# Patient Record
Sex: Female | Born: 1950 | Race: White | Hispanic: No | Smoking: Never smoker
Health system: Southern US, Community
[De-identification: ages and names within clinical notes are randomized; demographics above are authoritative.]

## PROBLEM LIST (undated history)

## (undated) DIAGNOSIS — R599 Enlarged lymph nodes, unspecified: Secondary | ICD-10-CM

## (undated) DIAGNOSIS — R519 Headache, unspecified: Secondary | ICD-10-CM

## (undated) DIAGNOSIS — R51 Headache: Secondary | ICD-10-CM

## (undated) DIAGNOSIS — B029 Zoster without complications: Secondary | ICD-10-CM

## (undated) DIAGNOSIS — M549 Dorsalgia, unspecified: Secondary | ICD-10-CM

## (undated) DIAGNOSIS — Z9221 Personal history of antineoplastic chemotherapy: Secondary | ICD-10-CM

## (undated) HISTORY — PX: WRIST GANGLION EXCISION: SUR520

## (undated) HISTORY — PX: HEMORRHOID SURGERY: SHX153

## (undated) HISTORY — PX: BREAST SURGERY: SHX581

## (undated) HISTORY — PX: TUBAL LIGATION: SHX77

---

## 1978-09-23 HISTORY — PX: ABDOMINAL HYSTERECTOMY: SHX81

## 2001-10-12 ENCOUNTER — Encounter: Payer: Self-pay | Admitting: Internal Medicine

## 2001-10-12 ENCOUNTER — Ambulatory Visit (HOSPITAL_COMMUNITY): Admission: RE | Admit: 2001-10-12 | Discharge: 2001-10-12 | Payer: Self-pay | Admitting: Internal Medicine

## 2002-11-17 ENCOUNTER — Ambulatory Visit (HOSPITAL_COMMUNITY): Admission: RE | Admit: 2002-11-17 | Discharge: 2002-11-17 | Payer: Self-pay | Admitting: Internal Medicine

## 2002-11-17 ENCOUNTER — Encounter: Payer: Self-pay | Admitting: Internal Medicine

## 2002-11-22 ENCOUNTER — Encounter: Payer: Self-pay | Admitting: Internal Medicine

## 2002-11-22 ENCOUNTER — Ambulatory Visit (HOSPITAL_COMMUNITY): Admission: RE | Admit: 2002-11-22 | Discharge: 2002-11-22 | Payer: Self-pay | Admitting: Internal Medicine

## 2003-11-07 ENCOUNTER — Ambulatory Visit (HOSPITAL_COMMUNITY): Admission: RE | Admit: 2003-11-07 | Discharge: 2003-11-07 | Payer: Self-pay | Admitting: Internal Medicine

## 2003-11-28 ENCOUNTER — Ambulatory Visit (HOSPITAL_COMMUNITY): Admission: RE | Admit: 2003-11-28 | Discharge: 2003-11-28 | Payer: Self-pay | Admitting: Internal Medicine

## 2004-03-19 ENCOUNTER — Ambulatory Visit (HOSPITAL_COMMUNITY): Admission: RE | Admit: 2004-03-19 | Discharge: 2004-03-19 | Payer: Self-pay | Admitting: Internal Medicine

## 2004-09-07 ENCOUNTER — Ambulatory Visit: Admission: RE | Admit: 2004-09-07 | Discharge: 2004-09-07 | Payer: Self-pay | Admitting: Internal Medicine

## 2005-02-14 ENCOUNTER — Ambulatory Visit (HOSPITAL_COMMUNITY): Admission: RE | Admit: 2005-02-14 | Discharge: 2005-02-14 | Payer: Self-pay | Admitting: Internal Medicine

## 2006-05-13 ENCOUNTER — Ambulatory Visit: Payer: Self-pay | Admitting: Internal Medicine

## 2006-10-17 ENCOUNTER — Ambulatory Visit: Payer: Self-pay | Admitting: Internal Medicine

## 2006-10-17 ENCOUNTER — Ambulatory Visit (HOSPITAL_COMMUNITY): Admission: RE | Admit: 2006-10-17 | Discharge: 2006-10-17 | Payer: Self-pay | Admitting: Internal Medicine

## 2006-10-24 ENCOUNTER — Encounter (INDEPENDENT_AMBULATORY_CARE_PROVIDER_SITE_OTHER): Payer: Self-pay | Admitting: Specialist

## 2006-10-24 ENCOUNTER — Ambulatory Visit: Payer: Self-pay | Admitting: Internal Medicine

## 2006-10-24 ENCOUNTER — Ambulatory Visit (HOSPITAL_COMMUNITY): Admission: RE | Admit: 2006-10-24 | Discharge: 2006-10-24 | Payer: Self-pay | Admitting: Internal Medicine

## 2007-10-19 ENCOUNTER — Ambulatory Visit (HOSPITAL_COMMUNITY): Admission: RE | Admit: 2007-10-19 | Discharge: 2007-10-19 | Payer: Self-pay | Admitting: Internal Medicine

## 2007-10-29 ENCOUNTER — Encounter: Admission: RE | Admit: 2007-10-29 | Discharge: 2007-10-29 | Payer: Self-pay | Admitting: Internal Medicine

## 2008-04-25 ENCOUNTER — Encounter: Admission: RE | Admit: 2008-04-25 | Discharge: 2008-04-25 | Payer: Self-pay | Admitting: Internal Medicine

## 2008-11-14 ENCOUNTER — Encounter: Admission: RE | Admit: 2008-11-14 | Discharge: 2008-11-14 | Payer: Self-pay | Admitting: Internal Medicine

## 2009-06-02 ENCOUNTER — Encounter: Admission: RE | Admit: 2009-06-02 | Discharge: 2009-06-02 | Payer: Self-pay | Admitting: Internal Medicine

## 2009-12-25 ENCOUNTER — Encounter (INDEPENDENT_AMBULATORY_CARE_PROVIDER_SITE_OTHER): Payer: Self-pay

## 2010-10-14 ENCOUNTER — Encounter: Payer: Self-pay | Admitting: Internal Medicine

## 2010-10-15 ENCOUNTER — Encounter: Payer: Self-pay | Admitting: Internal Medicine

## 2010-10-23 NOTE — Letter (Signed)
Summary: Recall, Screening Colonoscopy Only  [facility]  [address]   [place], [place] [address]   Phone: [phone]  Fax: [phone]    December 25, 2009  MARISHA RENIER [address] [place], [place]  [address] April 12, 1951   Dear Ms. Cecchi,   Our records indicate it is time to schedule your colonoscopy. Please call our office to schedule an office visit to get this scheduled.  You can reach [place] at [phone].     Thank you,    Burnadette Peter, LPN Waldon Merl, [facility] Ph: [phone]   Fax: [phone]

## 2010-10-23 NOTE — Miscellaneous (Signed)
Summary: Colonoscopy/10/24/2006  Clinical Lists Changes   NAME:  Katelyn Reeves, Katelyn Reeves                 ACCOUNT NO.:  [id number]   MEDICAL RECORD NO.:  [id number]          PATIENT TYPE:  AMB   LOCATION:  DAY                           FACILITY:  [facility]   PHYSICIAN:  R. Garfield Cornea, M.D. DATE OF BIRTH:  [birth year]   DATE OF PROCEDURE:  10/24/2006  DATE OF DISCHARGE:                               OPERATIVE REPORT   PROCEDURE:  Colonoscopy with biopsy.   INDICATIONS FOR PROCEDURE:  Patient is a 60 year old lady overdue for a  screening colonoscopy.  I attempted to get to her cecum on colonoscopy  in 2005 but was unable to do so because of redundancy and recurrent  looping.  She failed to follow up, so recommended air contrast barium  enema.  She has a history of hemoccult-positive stool.  She has some  intermittent, sporadic abdominal pain.  There has been a history of  intermittent rectal bleeding as well.  No family history of GI  malignancy.  She is overdue for routine GYN evaluation.  Colonoscopy is  now being done.  This approach has been discussed with the patient at  length.  Potential risks, benefits and alternatives have been reviewed  and questions answered.  She is agreeable.  Please see documentation on  the medical record.   PROCEDURE NOTE:  O2 saturation, blood pressure, pulses, and respirations  were monitored throughout the entire procedure.  Conscious sedation with  Versed 6 mg IV and Demerol 150 mg IV in divided doses.   INSTRUMENT:  Olympus video chip system, adult scope.   FINDINGS:  Digital rectal exam revealed tiny external hemorrhoid tag,  otherwise negative.   ENDOSCOPIC FINDINGS:  Prep was adequate.   COLON:  The colonic mucosa was surveyed from the rectosigmoid junction  to the left transverse, right colon, the appendiceal orifice, the  ileocecal valve, and cecum.  These structures were well seen and  photographed for the record.  The patient's colon was, again,  redundant  and elongated, and it was a challenge to get to the cecum.  A number of  maneuvers had to be employed, including external abdominal pressure and  changing of the patient's position; however, the cecum was reached.  From this level, the scope was slowly and cautiously withdrawn.  All  previously mentioned mucosal surfaces were again seen.  There was a  polyp in the base of the cecum (3 cm), cold-biopsied/removed.  The  remainder of the colonic mucosa appeared normal.  The scope was pulled  down into the rectum where a thorough examination of the rectal mucosa  and retroflexed view of the anal verge revealed internal hemorrhoids,  anal papillae only.  The patient tolerated the procedure well and was  reactive to endoscopy.   ENDOSCOPY IMPRESSION:  External and internal hemorrhoids and papillae,  otherwise normal rectum.  A long, tortuous, redundant colon, a  diminutive polyp in the cecum removed, otherwise the colonic mucosa  appeared normal.   RECOMMENDATIONS:  1. Hemorrhoid literature provided to Ms. Gruver.  2. Follow up on path.  3.  A 10-day course of Anusol-HC suppositories 1 per rectum at bedtime.  4. This nice lady was urged to follow through on GYN evaluation due to      her family history of ovarian cancer.  She is overdue.  She is also      to follow up with Dr. Willey Blade at some point in the near future.  5. Plan to see this nice lady back in three months to see how she is      doing from a GI standpoint.      Bridgette Habermann, M.D.  Electronically Signed     RMR/MEDQ  D:  10/24/2006  T:  10/24/2006  Job:  [id number]   cc:   Paula Compton. Willey Blade, MD  Fax: [phone]

## 2010-10-23 NOTE — Miscellaneous (Signed)
Summary: Path/ 10/24/2006  Clinical Lists Changes   SP Surgical Pathology - STATUS: Final             By: Lunette Stands,       Perform Date: 1 Feb08 00:01  Ordered By: Gala Romney MD , Cristopher Estimable           Ordered Date: 1 Feb08 14:02  Facility: [facility]                               Department: [facility]   [address] [place], [place] [address]   [phone]    REPORT OF SURGICAL PATHOLOGY    Case #: [id number]   Patient Name: Katelyn Reeves, Katelyn Reeves.   Office Chart Number: N/A    MRN: [id number]   Pathologist: Chrystie Nose. Saralyn Pilar, MD   DOB/Age 10-13-1950 (Age: 60) Gender: F   Date Taken: 10/24/2006   Date Received: 10/24/2006    FINAL DIAGNOSIS    ***MICROSCOPIC EXAMINATION AND DIAGNOSIS***    COLON, CECAL POLYP: TUBULAR ADENOMA. NO HIGH GRADE DYSPLASIA OR   MALIGNANCY IDENTIFIED.    mj   Date Reported: 10/27/2006 Chrystie Nose. Saralyn Pilar, MD   *** Electronically Signed Out By JDP ***    Clinical information   Polyp (km)    specimen(s) obtained   Colon, polyp(s)    Gross Description   Received in formalin is a tan, soft tissue fragment that is   submitted in toto. Size: 0.2 cm (SW:mj 10/24/06)    mj/

## 2010-10-24 ENCOUNTER — Other Ambulatory Visit: Payer: Self-pay | Admitting: Internal Medicine

## 2010-10-24 DIAGNOSIS — Z1239 Encounter for other screening for malignant neoplasm of breast: Secondary | ICD-10-CM

## 2010-10-29 ENCOUNTER — Ambulatory Visit: Payer: Self-pay

## 2010-11-09 ENCOUNTER — Ambulatory Visit
Admission: RE | Admit: 2010-11-09 | Discharge: 2010-11-09 | Disposition: A | Discharge status: Home / Self Care | Payer: BC Managed Care – PPO | Source: Ambulatory Visit | Attending: Internal Medicine | Admitting: Internal Medicine

## 2010-11-09 DIAGNOSIS — Z1239 Encounter for other screening for malignant neoplasm of breast: Secondary | ICD-10-CM

## 2011-02-08 NOTE — Op Note (Signed)
NAME:  Katelyn Reeves, Katelyn Reeves                 ACCOUNT NO.:  [id number]   MEDICAL RECORD NO.:  [id number]          PATIENT TYPE:  AMB   LOCATION:  DAY                           FACILITY:  [facility]   PHYSICIAN:  R. Garfield Cornea, M.D. DATE OF BIRTH:  07/22/1951   DATE OF PROCEDURE:  10/24/2006  DATE OF DISCHARGE:                               OPERATIVE REPORT   PROCEDURE:  Colonoscopy with biopsy.   INDICATIONS FOR PROCEDURE:  Patient is a 60 year old lady overdue for a  screening colonoscopy.  I attempted to get to her cecum on colonoscopy  in 2005 but was unable to do so because of redundancy and recurrent  looping.  She failed to follow up, so recommended air contrast barium  enema.  She has a history of hemoccult-positive stool.  She has some  intermittent, sporadic abdominal pain.  There has been a history of  intermittent rectal bleeding as well.  No family history of GI  malignancy.  She is overdue for routine GYN evaluation.  Colonoscopy is  now being done.  This approach has been discussed with the patient at  length.  Potential risks, benefits and alternatives have been reviewed  and questions answered.  She is agreeable.  Please see documentation on  the medical record.   PROCEDURE NOTE:  O2 saturation, blood pressure, pulses, and respirations  were monitored throughout the entire procedure.  Conscious sedation with  Versed 6 mg IV and Demerol 150 mg IV in divided doses.   INSTRUMENT:  Olympus video chip system, adult scope.   FINDINGS:  Digital rectal exam revealed tiny external hemorrhoid tag,  otherwise negative.   ENDOSCOPIC FINDINGS:  Prep was adequate.   COLON:  The colonic mucosa was surveyed from the rectosigmoid junction  to the left transverse, right colon, the appendiceal orifice, the  ileocecal valve, and cecum.  These structures were well seen and  photographed for the record.  The patient's colon was, again, redundant  and elongated, and it was a challenge to get to  the cecum.  A number of  maneuvers had to be employed, including external abdominal pressure and  changing of the patient's position; however, the cecum was reached.  From this level, the scope was slowly and cautiously withdrawn.  All  previously mentioned mucosal surfaces were again seen.  There was a  polyp in the base of the cecum (3 cm), cold-biopsied/removed.  The  remainder of the colonic mucosa appeared normal.  The scope was pulled  down into the rectum where a thorough examination of the rectal mucosa  and retroflexed view of the anal verge revealed internal hemorrhoids,  anal papillae only.  The patient tolerated the procedure well and was  reactive to endoscopy.   ENDOSCOPY IMPRESSION:  External and internal hemorrhoids and papillae,  otherwise normal rectum.  A long, tortuous, redundant colon, a  diminutive polyp in the cecum removed, otherwise the colonic mucosa  appeared normal.   RECOMMENDATIONS:  1. Hemorrhoid literature provided to Katelyn Reeves.  2. Follow up on path.  3. A 10-day course of Anusol-HC suppositories 1 per  rectum at bedtime.  4. This nice lady was urged to follow through on GYN evaluation due to      her family history of ovarian cancer.  She is overdue.  She is also      to follow up with Katelyn Reeves at some point in the near future.  5. Plan to see this nice lady back in three months to see how she is      doing from a GI standpoint.      Bridgette Habermann, M.D.  Electronically Signed     RMR/MEDQ  D:  10/24/2006  T:  10/24/2006  Job:  [id number]   cc:   Paula Compton. Willey Blade, MD  Fax: [phone]

## 2011-02-08 NOTE — H&P (Signed)
NAME:  [name], [name]                 ACCOUNT NO.:  [id number]   MEDICAL RECORD NO.:  [id number]          PATIENT TYPE:  AMB   LOCATION:                                FACILITY:  [facility]   PHYSICIAN:  Hildred Laser, M.D.    DATE OF BIRTH:  10/06/1950   DATE OF ADMISSION:  DATE OF DISCHARGE:  LH                              HISTORY & PHYSICAL   CHIEF COMPLAINT:  Abdominal pain, schedule colonoscopy.   HISTORY OF PRESENT ILLNESS:  [name] is a pleasant 60 year old lady  who was seen back in August of 2007 by Dr. Gala Romney for an episode of  abdominal pain and rectal bleeding.  Plan at that time was to obtain some lab work and possibly a CT scan  based on labs. It was advised that she eventually undergo a colonoscopy  given heme positive stools. She had been out of town on business for the  majority of the last 4 months. She called and wanted to schedule the  appointment because of ongoing abdominal pain and she was asked to be  seen today. She states that she is having sporadic episodes of abdominal  pain. She did pretty well in the summer time but developed another  episode in November and again 2 weeks ago. She describes having pain  which wakes her up at  4 a.m. in the morning. It is a crampy type pain. On one occasion she did  have multiple stools, but the other time no bowel movements. After these  episodes she was left with some right mid abdominal/flank tenderness.  She denies any dysuria, hematuria. No nausea, vomiting, fever or chills.  No melena or recent rectal bleeding. The pain lasts for an hour at a  time. She describes her right mid abdominal pain/right flank pain as  being superficial. She has gained 10 pounds since we last saw her. In  between these episodes she denies any postprandial abdominal pain and  actually has been testing herself by eating all kinds of fatty fried  foods without any result.   CURRENT MEDICATIONS:  None currently.   ALLERGIES:  SULFA.   PAST  MEDICAL HISTORY:  Hypertension.   PAST SURGICAL HISTORY:  Diagnostic laparoscopy for endometriosis; tubal  ligation; total abdominal hysterectomy with bilateral salpingo-  oophorectomy. She previously reported that she has had an appendectomy  but now states that she is not really sure that her appendix has been  removed.   FAMILY HISTORY:  Mother succumbed to ovarian cancer at age 53. Father  died in his 36's in an MVA. No history of chronic GI or liver illness.  No colorectal cancer in the family.   SOCIAL HISTORY:  She is divorced. She has one child. 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. No tobacco or alcohol use.   REVIEW OF SYSTEMS:  See HPI for GI. Denies any significant weight loss  and actually has gained 10 pounds since we last saw her.  CARDIOPULMONARY: No chest pain or shortness of  breath.   PHYSICAL EXAMINATION:  VITAL SIGNS: Weight 216.5, height 5 feet, 3  inches. Temperature 98.2, blood pressure 144/86, pulse 80.  GENERAL: A pleasant well-developed, well-nourished Caucasian female in  no acute distress.  SKIN: Warm and dry, no jaundice.  HEENT: Sclerae non icteric. Oropharyngeal mucosa moist and pink, no  lesions, erythema or exudates.  NECK: No lymphadenopathy.  CHEST:  Lungs are clear to auscultation.  CARDIAC EXAM: Reveals a regular rate and rhythm, normal S1, S2, no  murmurs, rubs, or gallops.  ABDOMEN: Positive bowel sounds, obese but symmetrical. Nontender. No  organomegaly or masses.  EXTREMITIES: No edema.   IMPRESSION:  Ms Katelyn Reeves is a 60 year old lady who is overdue for a  colonoscopy. Not mentioned before, she had an attempted colonoscopy in  2005 but her right colon could not be imaged. Unfortunately she did not  follow up with a barium enema as recommended. At her last office visit  she did have heme positive stools. It has been recommended that she  undergo colonoscopy for  that reason. Her abdominal pain is quite  sporadic. Etiology unknown at this time.   PLAN:  1. Colonoscopy in the near future. This may need to be done with a      pediatric scope because of a history of incomplete exam. If      colonoscopy is unremarkable she will need to have further work-up      of her abdominal pain if it continues.  2. Encouraged her to follow-up with a gynecologist as it has been      years since she has been seen.    Dictated by Lujean Amel. Marrianne Mood.      Neil Crouch, P.A.      Hildred Laser, M.D.  Electronically Signed    LL/MEDQ  D:  10/17/2006  T:  10/17/2006  Job:  [id number]

## 2011-02-08 NOTE — Op Note (Signed)
NAME:  Katelyn Reeves, Katelyn Reeves                           ACCOUNT NO.:  [id number]   MEDICAL RECORD NO.:  [id number]                   PATIENT TYPE:  AMB   LOCATION:  DAY                                  FACILITY:  [facility]   PHYSICIAN:  R. Garfield Cornea, M.D.              DATE OF BIRTH:  1951/01/10   DATE OF PROCEDURE:  03/19/2004  DATE OF DISCHARGE:                                 OPERATIVE REPORT   PROCEDURE:  Colonoscopy screening, incomplete.   ENDOSCOPIST:  Cristopher Estimable. Rourk, M.D.   INDICATIONS FOR PROCEDURE:  The patient is a 60 year old lady who comes for  colorectal cancer screening.  She has never had a complete colonoscopy.  She  has no bowel symptoms.  There is no family history of colorectal neoplasia.  Colonoscopy is now being done as a screening maneuver.  This approach has  been discussed with the patient at length.  The potential risks, benefits,  and alternatives have been reviewed; questions answered.   PROCEDURE NOTE:  O2 saturation, blood pressure, pulse and respirations were  monitored throughout the entirety of the procedure.  Conscious sedation:  Versed 5 mg IV, Demerol 125 mg IV in divided doses.   INSTRUMENT:  Olympus video chip system.   FINDINGS:  Digital rectal exam revealed no abnormalities.   ENDOSCOPIC FINDINGS:  The prep was adequate.   RECTUM:  Examination of the rectal mucosa revealed no abnormalities. The  rectal vault was small and I was unable to retroflex, but the rectal mucosa  was seen well.   COLON:  The colonic mucosa was surveyed from the rectosigmoid junction well  into, what I feel was the midcolon.  The colon was quite elongated and  tortuous and recurrent looping was a challenge in spite of changing the  patient's position and exhausting all external pressure maneuvers.  I was  unable to advance the scope into the right colon.  In addition, the patient  was poorly tolerant of the procedure and had significant discomfort during  the procedure.   The colonic mucosa that was seen appeared normal.  The  patient was reacted in endoscopy.   IMPRESSION:  1. Normal rectum.  2. Tortuous colon, recurrent looping precluded cecal intubation or imaging     of the right colon, for that matter.   RECOMMENDATIONS:  1. We will compliment today's study with an air-contrast barium enema and     enema to image her right colon.  2. Further recommendations to follow.      ___________________________________________                                            Bridgette Habermann, M.D.   RMR/MEDQ  D:  03/19/2004  T:  03/19/2004  Job:  [id number]   cc:  Paula Compton. Willey Blade, M.D.  [address]  [place] [address]  Fax: [phone]   R. Garfield Cornea, M.D.  [address]  [place]  [place] [address]  Fax: [phone]

## 2011-02-08 NOTE — H&P (Signed)
NAME:  Katelyn Reeves, Katelyn Reeves                 ACCOUNT NO.:  [id number]   MEDICAL RECORD NO.:  [id number]          PATIENT TYPE:  AMB   LOCATION:                                FACILITY:  [facility]   PHYSICIAN:  R. Garfield Cornea, M.D. DATE OF BIRTH:  1951/08/16   DATE OF ADMISSION:  05/13/2006  DATE OF DISCHARGE:  LH                                HISTORY & PHYSICAL   CHIEF COMPLAINT:  Episode of abdominal pain and rectal bleeding.   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  She has been moving her bowels pretty much daily, but has had a  scant amount of blood per rectum recently, certainly not an every-day  occurrence.  She has not had any abdominal pain whatsoever today.  She  really has not had any typical reflux symptoms, no early satiety.  She says  she has lost a couple of pounds, but she has been out in the heat, moving  into a new home in [place] and wonders if this is not the reason for the weight  loss.  The gallbladder remains in situ.  She is status post appendectomy.  She is status post total hysterectomy (mother succumb to ovarian cancer at  age 36+).  She takes hydrochlorothiazide 12.5 mg daily for hypertension  under the direction of Dr. Willey Blade, but she states she has not seen Dr. Willey Blade  in some time.   She is woefully overdue for a gynecology checkup, since it has been several  years.  She has not really had a fever or chills.  She has not seen any  other Weyman Bogdon for this rather nonspecific abdominal pain, which she  describes as generalized crampy discomfort.   This lady was last seen by me on March 19, 2004, at which time we attempted a  screening colonoscopy.  She had a markedly tortuous, redundant colon and in  spite  of my best effort, I was unable to image the right colon.  I scheduled  an air contrast barium enema and explained the importance of this to Ms.  Katelyn Reeves, but per Radiology, this nice lady cancelled her appointment for the  procedure and did not reschedule.   SOCIAL HISTORY:  She does not smoke, use alcohol or nonsteroidal agents.   PAST MEDICAL HISTORY:  Significant for hypertension.   PAST SURGICAL HISTORY:  1. Total hysterectomy.  2. Ganglion cyst removed from left wrist.  3. Appendectomy.   CURRENT MEDICATIONS:  Hydrochlorothiazide 12.5 mg daily.   ALLERGIES:  SULFA.   FAMILY HISTORY:  Mother succumb to ovarian cancer at age 45.  Father died in  his 24s in a motor vehicle accident.  No history of chronic GI or liver  illness.   SOCIAL HISTORY:  The patient is divorced.  She has 1 child.  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  She works for [employer].  No tobacco.  No alcohol.   REVIEW OF SYSTEMS:  No chest pain.  No dyspnea on exertion.  Minimal weight  loss recently.  No odynophagia, dysphagia or early satiety reflux symptoms.  No nausea or vomiting.  Scant hematochezia.  No melena.  She denies  constipation or diarrhea.   PHYSICAL EXAMINATION:  GENERAL:  This is a pleasant 60 year old lady,  resting comfortably.  VITAL SIGNS:  Weight 205.  Height 5 feet 3 inches.  Temperature 98.8.  BP  124/80.  Pulse 64.  SKIN:  Warm and dry.  There is no jaundice and no cutaneous stigmata of  chronic liver disease.  HEENT:  No scleral icterus.  Conjunctivae are pink.  Oral cavity with no  lesions.  NECK:  JVD is not prominent.  CHEST:  Lungs are clear to auscultation.  HEART:  Regular rate and rhythm without murmur, gallop or rub.  BREASTS:  Deferred.  ABDOMEN:  Significantly obese.  Positive bowel sounds.  Entirely nontender  to deep palpation.  No appreciable mass or organomegaly.  EXTREMITIES:  No edema.  RECTAL:  Good  sphincter tone.  One external hemorrhoid tag.  She does have  some hard stool, somewhat difficult to move, up in her proximal rectum,  really reaching, no obvious mass.  Stool recovered is brown in color, but  strongly Hemoccult-positive.   IMPRESSION:  Katelyn Reeves is a pleasant 60 year old lady who has really  had some very vague apparent self-limiting abdominal pain recently in the  setting of some scant blood per rectum.  She is Hemoccult-positive today.  I  am concerned that her right colon apparently has never been imaged, as this  lady failed to follow through with recommendations for further evaluation  back in 2005.  At this point, I think the best approach would be to go ahead  and offer her another colonoscopy in the hopes that we can make it to the  cecum to thoroughly evaluate her colon as to the etiology of her Hemoccult-  positive stool.  Her abdominal pain is quite nonspecific and apparently self-  limiting.  Depending on CT findings, we might contemplate a CT of the  abdomen and pelvis for further evaluation.  We will get a Chem-20 and CBC  today.  I have urged Katelyn Reeves to seek out gynecological evaluation, as she  is overdue.  Also, it would be a good idea for her to touch base with Dr.  Willey Blade at some time between now and the end of the summer.      Bridgette Habermann, M.D.  Electronically Signed     RMR/MEDQ  D:  05/13/2006  T:  05/13/2006  Job:  [id number]   cc:   Paula Compton. Willey Blade, MD  Fax: [phone]

## 2011-12-13 ENCOUNTER — Other Ambulatory Visit: Payer: Self-pay | Admitting: Internal Medicine

## 2011-12-13 DIAGNOSIS — Z1231 Encounter for screening mammogram for malignant neoplasm of breast: Secondary | ICD-10-CM

## 2011-12-20 ENCOUNTER — Ambulatory Visit: Payer: BC Managed Care – PPO

## 2011-12-20 ENCOUNTER — Ambulatory Visit
Admission: RE | Admit: 2011-12-20 | Discharge: 2011-12-20 | Disposition: A | Discharge status: Home / Self Care | Payer: BC Managed Care – PPO | Source: Ambulatory Visit | Attending: Internal Medicine | Admitting: Internal Medicine

## 2011-12-20 DIAGNOSIS — Z1231 Encounter for screening mammogram for malignant neoplasm of breast: Secondary | ICD-10-CM

## 2013-06-16 ENCOUNTER — Other Ambulatory Visit: Payer: Self-pay

## 2013-06-16 DIAGNOSIS — Z1231 Encounter for screening mammogram for malignant neoplasm of breast: Secondary | ICD-10-CM

## 2013-06-17 ENCOUNTER — Ambulatory Visit
Admission: RE | Admit: 2013-06-17 | Discharge: 2013-06-17 | Disposition: A | Discharge status: Home / Self Care | Payer: BC Managed Care – PPO | Source: Ambulatory Visit

## 2013-06-17 DIAGNOSIS — Z1231 Encounter for screening mammogram for malignant neoplasm of breast: Secondary | ICD-10-CM

## 2013-06-22 ENCOUNTER — Other Ambulatory Visit: Payer: Self-pay | Admitting: Internal Medicine

## 2013-06-22 DIAGNOSIS — R928 Other abnormal and inconclusive findings on diagnostic imaging of breast: Secondary | ICD-10-CM

## 2013-06-28 ENCOUNTER — Ambulatory Visit
Admission: RE | Admit: 2013-06-28 | Discharge: 2013-06-28 | Disposition: A | Discharge status: Home / Self Care | Payer: BC Managed Care – PPO | Source: Ambulatory Visit | Attending: Internal Medicine | Admitting: Internal Medicine

## 2013-06-28 DIAGNOSIS — R928 Other abnormal and inconclusive findings on diagnostic imaging of breast: Secondary | ICD-10-CM

## 2013-07-06 ENCOUNTER — Other Ambulatory Visit: Payer: BC Managed Care – PPO

## 2013-10-04 ENCOUNTER — Other Ambulatory Visit: Payer: Self-pay | Admitting: Internal Medicine

## 2013-10-04 DIAGNOSIS — M5412 Radiculopathy, cervical region: Secondary | ICD-10-CM

## 2013-10-10 ENCOUNTER — Ambulatory Visit
Admission: RE | Admit: 2013-10-10 | Discharge: 2013-10-10 | Disposition: A | Discharge status: Home / Self Care | Payer: BC Managed Care – PPO | Source: Ambulatory Visit | Attending: Internal Medicine | Admitting: Internal Medicine

## 2013-10-10 DIAGNOSIS — M5412 Radiculopathy, cervical region: Secondary | ICD-10-CM

## 2013-10-11 ENCOUNTER — Other Ambulatory Visit: Payer: BC Managed Care – PPO

## 2015-09-24 HISTORY — PX: CYST EXCISION: SHX5701

## 2016-10-24 ENCOUNTER — Other Ambulatory Visit (HOSPITAL_COMMUNITY): Payer: Self-pay | Admitting: Internal Medicine

## 2016-10-24 DIAGNOSIS — R3129 Other microscopic hematuria: Secondary | ICD-10-CM

## 2016-10-25 ENCOUNTER — Ambulatory Visit (HOSPITAL_COMMUNITY)
Admission: RE | Admit: 2016-10-25 | Discharge: 2016-10-25 | Disposition: A | Discharge status: Home / Self Care | Payer: BLUE CROSS/BLUE SHIELD | Source: Ambulatory Visit | Attending: Internal Medicine | Admitting: Internal Medicine

## 2016-10-25 DIAGNOSIS — R932 Abnormal findings on diagnostic imaging of liver and biliary tract: Secondary | ICD-10-CM | POA: Diagnosis not present

## 2016-10-25 DIAGNOSIS — M545 Low back pain: Secondary | ICD-10-CM | POA: Insufficient documentation

## 2016-10-25 DIAGNOSIS — N390 Urinary tract infection, site not specified: Secondary | ICD-10-CM | POA: Diagnosis not present

## 2016-10-25 DIAGNOSIS — R3129 Other microscopic hematuria: Secondary | ICD-10-CM | POA: Diagnosis present

## 2016-10-25 DIAGNOSIS — I700 Atherosclerosis of aorta: Secondary | ICD-10-CM | POA: Diagnosis not present

## 2016-10-25 DIAGNOSIS — R590 Localized enlarged lymph nodes: Secondary | ICD-10-CM | POA: Insufficient documentation

## 2016-10-29 ENCOUNTER — Other Ambulatory Visit (HOSPITAL_COMMUNITY): Payer: Self-pay | Admitting: Internal Medicine

## 2016-10-29 ENCOUNTER — Ambulatory Visit (HOSPITAL_COMMUNITY): Payer: BLUE CROSS/BLUE SHIELD

## 2016-10-29 ENCOUNTER — Ambulatory Visit (HOSPITAL_COMMUNITY)
Admission: RE | Admit: 2016-10-29 | Discharge: 2016-10-29 | Disposition: A | Discharge status: Home / Self Care | Payer: BLUE CROSS/BLUE SHIELD | Source: Ambulatory Visit | Attending: Internal Medicine | Admitting: Internal Medicine

## 2016-10-29 ENCOUNTER — Encounter (HOSPITAL_COMMUNITY): Payer: Self-pay

## 2016-10-29 DIAGNOSIS — Z1231 Encounter for screening mammogram for malignant neoplasm of breast: Secondary | ICD-10-CM

## 2016-10-29 DIAGNOSIS — R590 Localized enlarged lymph nodes: Secondary | ICD-10-CM | POA: Insufficient documentation

## 2016-10-30 ENCOUNTER — Other Ambulatory Visit (HOSPITAL_COMMUNITY): Payer: Self-pay | Admitting: Internal Medicine

## 2016-10-30 ENCOUNTER — Ambulatory Visit (HOSPITAL_COMMUNITY): Payer: BLUE CROSS/BLUE SHIELD

## 2016-10-30 DIAGNOSIS — R599 Enlarged lymph nodes, unspecified: Secondary | ICD-10-CM

## 2016-11-01 ENCOUNTER — Ambulatory Visit (HOSPITAL_COMMUNITY)
Admission: RE | Admit: 2016-11-01 | Discharge: 2016-11-01 | Disposition: A | Discharge status: Home / Self Care | Payer: BLUE CROSS/BLUE SHIELD | Source: Ambulatory Visit | Attending: Internal Medicine | Admitting: Internal Medicine

## 2016-11-01 DIAGNOSIS — Z1231 Encounter for screening mammogram for malignant neoplasm of breast: Secondary | ICD-10-CM | POA: Insufficient documentation

## 2016-11-07 ENCOUNTER — Ambulatory Visit (HOSPITAL_COMMUNITY)
Admission: RE | Admit: 2016-11-07 | Discharge: 2016-11-07 | Disposition: A | Discharge status: Home / Self Care | Payer: BLUE CROSS/BLUE SHIELD | Source: Ambulatory Visit | Attending: Internal Medicine | Admitting: Internal Medicine

## 2016-11-07 DIAGNOSIS — R161 Splenomegaly, not elsewhere classified: Secondary | ICD-10-CM | POA: Insufficient documentation

## 2016-11-07 DIAGNOSIS — I700 Atherosclerosis of aorta: Secondary | ICD-10-CM | POA: Diagnosis not present

## 2016-11-07 DIAGNOSIS — M160 Bilateral primary osteoarthritis of hip: Secondary | ICD-10-CM | POA: Insufficient documentation

## 2016-11-07 DIAGNOSIS — I708 Atherosclerosis of other arteries: Secondary | ICD-10-CM | POA: Insufficient documentation

## 2016-11-07 DIAGNOSIS — R599 Enlarged lymph nodes, unspecified: Secondary | ICD-10-CM | POA: Diagnosis not present

## 2016-11-07 LAB — GLUCOSE, CAPILLARY: Glucose-Capillary: 130 mg/dL — ABNORMAL HIGH (ref 65–99)

## 2016-11-07 MED ORDER — FLUDEOXYGLUCOSE F - 18 (FDG) INJECTION
10.1300 | Freq: Once | INTRAVENOUS | Status: AC | PRN
  Administered 2016-11-07: 10.13 via INTRAVENOUS

## 2016-11-11 ENCOUNTER — Telehealth: Payer: Self-pay | Admitting: Hematology

## 2016-11-11 NOTE — Telephone Encounter (Signed)
Pt has been scheduled to see Dr. Irene Limbo on 2/20 at 830am. Pt aware to arrive 30 minutes early. Voiced understanding.

## 2016-11-12 ENCOUNTER — Encounter: Payer: Self-pay | Admitting: Hematology

## 2016-11-12 ENCOUNTER — Ambulatory Visit (HOSPITAL_BASED_OUTPATIENT_CLINIC_OR_DEPARTMENT_OTHER): Payer: BLUE CROSS/BLUE SHIELD

## 2016-11-12 ENCOUNTER — Telehealth: Payer: Self-pay | Admitting: Hematology

## 2016-11-12 ENCOUNTER — Ambulatory Visit (HOSPITAL_BASED_OUTPATIENT_CLINIC_OR_DEPARTMENT_OTHER): Payer: BLUE CROSS/BLUE SHIELD | Admitting: Hematology

## 2016-11-12 VITALS — BP 147/78 | HR 76 | Temp 98.7°F | Resp 18 | Ht 63.0 in | Wt 198.9 lb

## 2016-11-12 DIAGNOSIS — R161 Splenomegaly, not elsewhere classified: Secondary | ICD-10-CM

## 2016-11-12 DIAGNOSIS — R591 Generalized enlarged lymph nodes: Secondary | ICD-10-CM

## 2016-11-12 DIAGNOSIS — R634 Abnormal weight loss: Secondary | ICD-10-CM

## 2016-11-12 DIAGNOSIS — D591 Other autoimmune hemolytic anemias: Secondary | ICD-10-CM | POA: Diagnosis not present

## 2016-11-12 DIAGNOSIS — R791 Abnormal coagulation profile: Secondary | ICD-10-CM

## 2016-11-12 DIAGNOSIS — K7460 Unspecified cirrhosis of liver: Secondary | ICD-10-CM

## 2016-11-12 DIAGNOSIS — R61 Generalized hyperhidrosis: Secondary | ICD-10-CM

## 2016-11-12 DIAGNOSIS — G893 Neoplasm related pain (acute) (chronic): Secondary | ICD-10-CM

## 2016-11-12 LAB — COMPREHENSIVE METABOLIC PANEL
ALBUMIN: 4.1 g/dL (ref 3.5–5.0)
ALK PHOS: 95 U/L (ref 40–150)
ALT: 16 U/L (ref 0–55)
AST: 22 U/L (ref 5–34)
Anion Gap: 12 mEq/L — ABNORMAL HIGH (ref 3–11)
BUN: 11.9 mg/dL (ref 7.0–26.0)
CHLORIDE: 104 meq/L (ref 98–109)
CO2: 24 meq/L (ref 22–29)
Calcium: 9.6 mg/dL (ref 8.4–10.4)
Creatinine: 0.8 mg/dL (ref 0.6–1.1)
EGFR: 82 mL/min/{1.73_m2} — AB (ref >90)
GLUCOSE: 107 mg/dL (ref 70–140)
POTASSIUM: 4.2 meq/L (ref 3.5–5.1)
SODIUM: 140 meq/L (ref 136–145)
Total Bilirubin: 0.49 mg/dL (ref 0.20–1.20)
Total Protein: 8.3 g/dL (ref 6.4–8.3)

## 2016-11-12 LAB — CBC & DIFF AND RETIC
BASO%: 1.1 % (ref 0.0–2.0)
Basophils Absolute: 0.1 10*3/uL (ref 0.0–0.1)
EOS%: 3.7 % (ref 0.0–7.0)
Eosinophils Absolute: 0.2 10*3/uL (ref 0.0–0.5)
HCT: 35.5 % (ref 34.8–46.6)
HGB: 11.8 g/dL (ref 11.6–15.9)
Immature Retic Fract: 2.8 % (ref 1.60–10.00)
LYMPH%: 24.5 % (ref 14.0–49.7)
MCH: 29.1 pg (ref 25.1–34.0)
MCHC: 33.2 g/dL (ref 31.5–36.0)
MCV: 87.7 fL (ref 79.5–101.0)
MONO#: 0.3 10*3/uL (ref 0.1–0.9)
MONO%: 5.5 % (ref 0.0–14.0)
NEUT%: 65.2 % (ref 38.4–76.8)
NEUTROS ABS: 3.6 10*3/uL (ref 1.5–6.5)
Platelets: 193 10*3/uL (ref 145–400)
RBC: 4.05 10*6/uL (ref 3.70–5.45)
RDW: 12.8 % (ref 11.2–14.5)
Retic %: 1.21 % (ref 0.70–2.10)
Retic Ct Abs: 49.01 10*3/uL (ref 33.70–90.70)
WBC: 5.5 10*3/uL (ref 3.9–10.3)
lymph#: 1.3 10*3/uL (ref 0.9–3.3)

## 2016-11-12 LAB — PROTIME-INR
INR: 1 — AB (ref 2.00–3.50)
Protime: 12 Seconds (ref 10.6–13.4)

## 2016-11-12 LAB — LACTATE DEHYDROGENASE: LDH: 210 U/L (ref 125–245)

## 2016-11-12 MED ORDER — HYDROCODONE-ACETAMINOPHEN 5-325 MG PO TABS: 1.0000 | ORAL_TABLET | Freq: Four times a day (QID) | ORAL | 0 refills | Status: DC | PRN

## 2016-11-12 MED ORDER — SENNOSIDES-DOCUSATE SODIUM 8.6-50 MG PO TABS: 2.0000 | ORAL_TABLET | Freq: Every evening | ORAL | 1 refills | Status: DC | PRN

## 2016-11-12 NOTE — Telephone Encounter (Signed)
Labs added for today. [facility] scheduled for 02/27 @ 9 am with [facility]. Messages sent to [facility] for pre-auth. Appointments scheduled per 11/12/16 los. Patient was given a copy of the AVS report and appointment schedule, per 11/12/16 los.

## 2016-11-12 NOTE — Progress Notes (Signed)
Marland Kitchen    HEMATOLOGY/ONCOLOGY CONSULTATION NOTE  Date of Service: 11/12/2016  Patient Care Team: Asencion Noble, MD as PCP - General (Internal Medicine)  CHIEF COMPLAINTS/PURPOSE OF CONSULTATION:  Suspected Non Hodgkins Lymphoma  HISTORY OF PRESENTING ILLNESS:   Katelyn Reeves is a wonderful 66 y.o. female who has been referred to [place] by Dr .Asencion Noble, MD for evaluation and management of suspected non-Hodgkin's lymphoma.  Patient notes that she presented with back pain for 4-6 weeks and eventually had pain radiating to her right inguinal area. She notes that she has had interstitial cystitis in the past. She was thought to have a urinary tract infection and was treated with Macrodantin. Says the pain got worse and started radiating into the right groin she had a CT of the abdomen renal stone protocol on 10/25/2016 which showed extensive retroperitoneal lymphadenopathy concerning for lymphoproliferative disorder. She was incidentally noted to have a nodular border of the liver with possible liver cirrhosis. No urinary stones noted.  She subsequently had a PET CT scan ordered by her primary care physician which was done on 11/07/2016. This showed diffuse hypermetabolic spleen with mild splenomegaly. Hypermetabolic adenopathy in the left supraclavicular, bilateral axillary, peripancreatic, gastrohepatic ligament, celiac, porta hepatis, periaortic, and left external iliac chains. Hypermetabolic mesenteric lymph nodes. Appearance favors lymphoma.  Patient notes some night sweats for 2 weeks. Has lost about 10 pounds in the last month. No acute fevers or chills. Notes the back pain is controlled with when necessary Vicodin.  MEDICAL HISTORY:  #1 fibrocystic disease of the breast #2 hypertension #3 constipation #4 duplicated right sided ureters #5 imaging concern for possible liver cirrhosis  SURGICAL HISTORY:  #1 total abdominal hysterectomy with bilateral salpingo-oophorectomy for ovarian cyst. #2  Lipoma removal left upper back #3 hemorrhoidectomy  SOCIAL HISTORY: Social History   Social History  . Marital status: Divorced    Spouse name: N/A  . Number of children: N/A  . Years of education: N/A   Occupational History  . Not on file.   Social History Main Topics  . Smoking status: Not on file  . Smokeless tobacco: Not on file  . Alcohol use Not on file  . Drug use: Unknown  . Sexual activity: Not on file   Other Topics Concern  . Not on file   Social History Narrative  . No narrative on file  Nonsmoker no issues with alcohol use or drug use. 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   FAMILY HISTORY:  Notes her mother had ovarian cancer followed by leukemia and died at age 69 years Maternal aunt gastric cancer  ALLERGIES:  is allergic to sulfa antibiotics.  MEDICATIONS:  Current Outpatient Prescriptions  Medication Sig Dispense Refill  . HYDROcodone-acetaminophen (NORCO) 5-325 MG tablet Take 1-2 tablets by mouth every 6 (six) hours as needed for moderate pain or severe pain. 30 tablet 0  . senna-docusate (SENNA S) 8.6-50 MG tablet Take 2 tablets by mouth at bedtime as needed for mild constipation or moderate constipation. 60 tablet 1   No current facility-administered medications for this visit.     REVIEW OF SYSTEMS:    10 Point review of Systems was done is negative except as noted above.  PHYSICAL EXAMINATION: ECOG PERFORMANCE STATUS: 1 - Symptomatic but completely ambulatory  . Vitals:   11/12/16 0902  BP: (!) 147/78  Pulse: 76  Resp: 18  Temp: 98.7 F (37.1 C)   Filed Weights   11/12/16 0902  Weight: 198 lb 14.4 oz (90.2 kg)   .Body mass index is 35.23 kg/m.  GENERAL:alert, in no acute distress and comfortable SKIN: skin color, texture, turgor are normal, no rashes or significant lesions EYES: normal, conjunctiva are pink and non-injected, sclera clear OROPHARYNX:no exudate, no erythema and  lips, buccal mucosa, and tongue normal  NECK: supple, no JVD, thyroid normal size, non-tender, without nodularity LYMPH: palpable left supraclavicular and left axillary lymph node. Smaller right axillary lymph node. LUNGS: clear to auscultation with normal respiratory effort HEART: regular rate & rhythm,  no murmurs and no lower extremity edema ABDOMEN: abdomen soft, non-tender, normoactive bowel sounds , mild tenderness to palpation in left upper abdomen. Musculoskeletal: no cyanosis of digits and no clubbing  PSYCH: alert & oriented x 3 with fluent speech NEURO: no focal motor/sensory deficits  LABORATORY DATA:  I have reviewed the data as listed  . CBC Latest Ref Rng & Units 11/12/2016  WBC 3.9 - 10.3 10e3/uL 5.5  Hemoglobin 11.6 - 15.9 g/dL 11.8  Hematocrit 34.8 - 46.6 % 35.5  Platelets 145 - 400 10e3/uL 193   CBC    Component Value Date/Time   WBC 5.5 11/12/2016 1033   RBC 4.05 11/12/2016 1033   HGB 11.8 11/12/2016 1033   HCT 35.5 11/12/2016 1033   PLT 193 11/12/2016 1033   MCV 87.7 11/12/2016 1033   MCH 29.1 11/12/2016 1033   MCHC 33.2 11/12/2016 1033   RDW 12.8 11/12/2016 1033   LYMPHSABS 1.3 11/12/2016 1033   MONOABS 0.3 11/12/2016 1033   EOSABS 0.2 11/12/2016 1033   BASOSABS 0.1 11/12/2016 1033   . CMP Latest Ref Rng & Units 11/12/2016  Glucose 70 - 140 mg/dl 107  BUN 7.0 - 26.0 mg/dL 11.9  Creatinine 0.6 - 1.1 mg/dL 0.8  Sodium 136 - 145 mEq/L 140  Potassium 3.5 - 5.1 mEq/L 4.2  CO2 22 - 29 mEq/L 24  Calcium 8.4 - 10.4 mg/dL 9.6  Total Protein 6.4 - 8.3 g/dL 8.3  Total Bilirubin 0.20 - 1.20 mg/dL 0.49  Alkaline Phos 40 - 150 U/L 95  AST 5 - 34 U/L 22  ALT 0 - 55 U/L 16   . Lab Results  Component Value Date   LDH 210 11/12/2016   Component     Latest Ref Rng & Units 11/12/2016  Hep C Virus Ab     0.0 - 0.9 s/co ratio <0.1  Hepatitis B Surface Ag     Negative Negative  Hep B Core Ab, Tot     Negative Negative   Component     Latest Ref Rng &  Units 11/12/2016  Protime     10.6 - 13.4 Seconds 12.0  INR     2.00 - 3.50 1.00 (L)  Lovenox      No  APTT     24 - 33 sec 27   RADIOGRAPHIC STUDIES: I have personally reviewed the radiological images as listed and agreed with the findings in the report. Dg Chest 2 View  Result Date: 10/30/2016 CLINICAL DATA:  Shortness of breath with walking, localized enlarged lymph nodes EXAM: CHEST  2 VIEW COMPARISON:  11/07/2003 FINDINGS: Normal heart size, mediastinal contours, and pulmonary vascularity. Lungs clear. No pleural effusion or pneumothorax. Scattered endplate spur formation thoracic spine. Bones appear demineralized. IMPRESSION: No acute abnormalities. Electronically Signed   By: Lavonia Dana M.D.   On: 10/30/2016 08:21   Nm Pet Image Initial (pi) Skull Base To Thigh  Result Date: 11/07/2016  CLINICAL DATA:  Initial treatment strategy for retroperitoneal lymph node enlargement. EXAM: NUCLEAR MEDICINE PET SKULL BASE TO THIGH TECHNIQUE: 10.1 mCi F-18 FDG was injected intravenously. Full-ring PET imaging was performed from the skull base to thigh after the radiotracer. CT data was obtained and used for attenuation correction and anatomic localization. FASTING BLOOD GLUCOSE:  Value: 130 mg/dl COMPARISON:  CT scan dated 10/25/2016 FINDINGS: NECK Left supraclavicular lymph node extending towards the upper mediastinum measures 1.3 cm in short axis with maximum SUV 20.5. Smaller scattered tiny left supraclavicular lymph nodes are mildly hypermetabolic, maximum SUV of the smaller nodes 5.6. CHEST Hypermetabolic left axillary adenopathy, the largest of the left axillary lymph nodes measures 1.1 cm in short axis on image 54/4 and has a maximum standard uptake value of 20.2. Tiny but hypermetabolic right axillary lymph nodes. ABDOMEN/PELVIS Diffusely hypermetabolic spleen, representative central splenic SUV 11.8, with splenic volume 550 cc compatible with mild splenomegaly. Hypermetabolic peripancreatic,  gastrohepatic ligament, celiac trunk, porta hepatis, periaortic/retroperitoneal, and left external iliac adenopathy. A representative 2.5 cm left periaortic lymph node below the level of the renal vessels on image 114/4 has a maximum standard uptake value of 25.6. The left external iliac node has a short axis diameter of 1.2 cm on image 166/4 and a maximum SUV of 18.1. A right mesenteric lymph node at the level of the upper pelvis measures 0.9 cm in short axis on image 150/4 and has a maximum standard uptake value of 22.9. Other smaller lymph nodes along the root of the mesentery are faintly hypermetabolic. Small but mildly hypermetabolic left inguinal lymph nodes. Mild abdominal aortic atherosclerotic calcification. Stranding in the root of the mesentery. SKELETON No focal hypermetabolic activity to suggest skeletal metastasis. Bridging spurring a left sacroiliac joint. Degenerative arthropathy of both hips. Intervertebral spurring at L2- 3 and L3-4. IMPRESSION: 1. Diffusely hypermetabolic spleen with mild splenomegaly, and hypermetabolic adenopathy in the left supraclavicular, bilateral axillary, peripancreatic, gastrohepatic ligament, celiac, porta hepatis, periaortic, and left external iliac chains. Hypermetabolic mesenteric lymph nodes. Appearance favors lymphoma, tissue diagnosis recommended. The most pathologically enlarged lymph nodes are in the retroperitoneum ; there some tiny left supraclavicular lymph nodes which may be hard to biopsy, the larger left supraclavicular node may be tricky as it extends down to into the upper mediastinum adjacent to the vessels. 2. Mild atherosclerosis. 3. Degenerative arthropathy of both hips. Intervertebral spurring at L2-3 and L3-4. Electronically Signed   By: Van Clines M.D.   On: 11/07/2016 13:56   Mm Digital Screening Bilateral  Result Date: 11/01/2016 CLINICAL DATA:  Screening. EXAM: DIGITAL SCREENING BILATERAL MAMMOGRAM WITH CAD COMPARISON:  Previous  exam(s). ACR Breast Density Category b: There are scattered areas of fibroglandular density. FINDINGS: There are no findings suspicious for malignancy. Images were processed with CAD. IMPRESSION: No mammographic evidence of malignancy. A result letter of this screening mammogram will be mailed directly to the patient. RECOMMENDATION: Screening mammogram in one year. (Code:SM-B-01Y) BI-RADS CATEGORY  1: Negative. Electronically Signed   By: Everlean Alstrom M.D.   On: 11/01/2016 14:16   Ct Renal Stone Study  Result Date: 10/25/2016 CLINICAL DATA:  Right flank pain radiating into the right groin for 3 weeks. EXAM: CT ABDOMEN AND PELVIS WITHOUT CONTRAST TECHNIQUE: Multidetector CT imaging of the abdomen and pelvis was performed following the standard protocol without IV contrast. COMPARISON:  None. FINDINGS: Lower chest: Lung bases are clear. No pleural or pericardial effusion. Hepatobiliary: The liver border has a nodular appearance compatible with cirrhosis. No focal  lesion is identified. The gallbladder and biliary tree appear normal. Pancreas: Atrophic without focal lesion. Spleen: Normal in size without focal abnormality. Adrenals/Urinary Tract: Bifid right renal collecting system is noted with mild dilatation of the lower pole moiety. No hydronephrosis or obstructing stone. Urinary bladder is unremarkable. Stomach/Bowel: Stomach is within normal limits. Appendix appears normal. No evidence of bowel wall thickening, distention, or inflammatory changes. Vascular/Lymphatic: Extensive retroperitoneal lymphadenopathy is identified. Left para-aortic node on image 38 measures 2.3 x 2.4 cm. Interaortocaval node on image 31 measures 2.4 by 1.6 cm. Left external iliac node on image 69 measures 1.2 cm. Haziness of the mesenteries noted. Scattered aortic atherosclerotic calcifications without aneurysm are seen. Reproductive: Status post hysterectomy. Other: No fluid collection. Musculoskeletal: No lytic or sclerotic bony  lesion. Calcified disc protrusions L2-3 and L3-4 noted. Postoperative change lower lumbar spine is noted. IMPRESSION: Extensive retroperitoneal lymphadenopathy most consistent with lymphoproliferative disease such as chronic lymphocytic leukemia. Nodular border of the liver compatible with cirrhosis. Negative for urinary tract stone. Scattered aortic atherosclerosis without aneurysm. These results will be called to the ordering clinician or representative by the Radiologist Assistant, and communication documented in the PACS or zVision Dashboard. Electronically Signed   By: Inge Rise M.D.   On: 10/25/2016 14:32    ASSESSMENT & PLAN:   66 yo caucasian female with is actively working as a Forensic scientist with   1) Generalized FDG avid lymphadenopathy - predominantly in the abdomen/Retroperitoneum. Also noted to have left supraclavicular and bilateral axillary left more than right FDG avid lymphadenopathy. Overall presentation is certainly concerning for lymphoma. Based on imaging this would represent at least Stage III disease if this were a lymphoma. LDH level is not significantly elevated. Blood counts are stable. PET/CT scan does show fairly active disease which is FDG avid with FDG avid splenomegaly. Patient has some constitutional symptoms with about a 10 pound weight loss and some night sweats. Hepatitis profile negative. No other obvious new focal symptoms. Plan -I discussed the imaging findings, lab results and diagnostic possibilities in detail with the patient. 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 -Given lymphoma is the top differential would prefer to have an excisional lymph node biopsy by general surgery to increase diagnostic yield -Will give the patient on urgent referral to [place] surgery for excision of her left axillary or left supraclavicular lymph  node. -If this is not possible or nondiagnostic would need to consider CT-guided needle biopsy of her retroperitoneal lymph node by interventional radiology. -We shall see her back in about 3-4 days after her lymph node biopsy for further treatment recommendations based on pathology results.  All of the patients questions were answered with apparent satisfaction. The patient knows to call the clinic with any problems, questions or concerns.  I spent 50 minutes counseling the patient face to face. The total time spent in the appointment was 60 minutes and more than 50% was on counseling and direct patient cares.    Sullivan Lone MD [place] [facility] Hematology/Oncology Physician [facility]  (Office):       [phone] (Work cell):  [phone] (Fax):           [phone]  11/12/2016 8:54 AM

## 2016-11-13 LAB — APTT: aPTT: 27 s (ref 24–33)

## 2016-11-13 LAB — HEPATITIS C ANTIBODY

## 2016-11-13 LAB — HEPATITIS B CORE ANTIBODY, TOTAL: Hep B Core Ab, Tot: NEGATIVE

## 2016-11-13 LAB — HEPATITIS B SURFACE ANTIGEN: HEP B S AG: NEGATIVE

## 2016-11-15 ENCOUNTER — Other Ambulatory Visit: Payer: Self-pay | Admitting: *Deleted

## 2016-11-15 DIAGNOSIS — R591 Generalized enlarged lymph nodes: Secondary | ICD-10-CM

## 2016-11-19 ENCOUNTER — Other Ambulatory Visit: Payer: Self-pay | Admitting: Student

## 2016-11-19 ENCOUNTER — Ambulatory Visit (HOSPITAL_COMMUNITY)
Admission: RE | Admit: 2016-11-19 | Discharge: 2016-11-19 | Disposition: A | Discharge status: Home / Self Care | Payer: BLUE CROSS/BLUE SHIELD | Source: Ambulatory Visit | Attending: Hematology | Admitting: Hematology

## 2016-11-19 DIAGNOSIS — I340 Nonrheumatic mitral (valve) insufficiency: Secondary | ICD-10-CM | POA: Diagnosis not present

## 2016-11-19 DIAGNOSIS — C8590 Non-Hodgkin lymphoma, unspecified, unspecified site: Secondary | ICD-10-CM | POA: Diagnosis not present

## 2016-11-19 DIAGNOSIS — I351 Nonrheumatic aortic (valve) insufficiency: Secondary | ICD-10-CM | POA: Insufficient documentation

## 2016-11-19 DIAGNOSIS — E669 Obesity, unspecified: Secondary | ICD-10-CM | POA: Diagnosis not present

## 2016-11-19 DIAGNOSIS — Z6835 Body mass index (BMI) 35.0-35.9, adult: Secondary | ICD-10-CM | POA: Diagnosis not present

## 2016-11-19 DIAGNOSIS — R591 Generalized enlarged lymph nodes: Secondary | ICD-10-CM

## 2016-11-19 NOTE — Progress Notes (Signed)
  Echocardiogram 2D Echocardiogram has been performed.  Tresa Res 11/19/2016, 10:55 AM

## 2016-11-20 ENCOUNTER — Other Ambulatory Visit: Payer: Self-pay | Admitting: Hematology

## 2016-11-20 ENCOUNTER — Encounter (HOSPITAL_COMMUNITY): Payer: Self-pay

## 2016-11-20 ENCOUNTER — Ambulatory Visit (HOSPITAL_COMMUNITY)
Admission: RE | Admit: 2016-11-20 | Discharge: 2016-11-20 | Disposition: A | Discharge status: Home / Self Care | Payer: BLUE CROSS/BLUE SHIELD | Source: Ambulatory Visit | Attending: Hematology | Admitting: Hematology

## 2016-11-20 ENCOUNTER — Ambulatory Visit: Payer: BLUE CROSS/BLUE SHIELD | Admitting: Hematology

## 2016-11-20 DIAGNOSIS — I96 Gangrene, not elsewhere classified: Secondary | ICD-10-CM | POA: Diagnosis not present

## 2016-11-20 DIAGNOSIS — R161 Splenomegaly, not elsewhere classified: Secondary | ICD-10-CM | POA: Diagnosis not present

## 2016-11-20 DIAGNOSIS — R590 Localized enlarged lymph nodes: Secondary | ICD-10-CM | POA: Diagnosis not present

## 2016-11-20 DIAGNOSIS — Z79899 Other long term (current) drug therapy: Secondary | ICD-10-CM | POA: Diagnosis not present

## 2016-11-20 DIAGNOSIS — E669 Obesity, unspecified: Secondary | ICD-10-CM | POA: Insufficient documentation

## 2016-11-20 DIAGNOSIS — M549 Dorsalgia, unspecified: Secondary | ICD-10-CM | POA: Insufficient documentation

## 2016-11-20 DIAGNOSIS — Z888 Allergy status to other drugs, medicaments and biological substances status: Secondary | ICD-10-CM | POA: Insufficient documentation

## 2016-11-20 DIAGNOSIS — Z90710 Acquired absence of both cervix and uterus: Secondary | ICD-10-CM | POA: Insufficient documentation

## 2016-11-20 DIAGNOSIS — R591 Generalized enlarged lymph nodes: Secondary | ICD-10-CM | POA: Insufficient documentation

## 2016-11-20 HISTORY — DX: Dorsalgia, unspecified: M54.9

## 2016-11-20 HISTORY — DX: Headache, unspecified: R51.9

## 2016-11-20 HISTORY — DX: Enlarged lymph nodes, unspecified: R59.9

## 2016-11-20 HISTORY — DX: Headache: R51

## 2016-11-20 LAB — CBC
HCT: 34.6 % — ABNORMAL LOW (ref 36.0–46.0)
Hemoglobin: 11.7 g/dL — ABNORMAL LOW (ref 12.0–15.0)
MCH: 29.3 pg (ref 26.0–34.0)
MCHC: 33.8 g/dL (ref 30.0–36.0)
MCV: 86.5 fL (ref 78.0–100.0)
PLATELETS: 275 10*3/uL (ref 150–400)
RBC: 4 MIL/uL (ref 3.87–5.11)
RDW: 12.6 % (ref 11.5–15.5)
WBC: 6.5 10*3/uL (ref 4.0–10.5)

## 2016-11-20 LAB — PROTIME-INR
INR: 1.02
Prothrombin Time: 13.4 seconds (ref 11.4–15.2)

## 2016-11-20 LAB — APTT: APTT: 30 s (ref 24–36)

## 2016-11-20 MED ORDER — MIDAZOLAM HCL 2 MG/2ML IJ SOLN
INTRAMUSCULAR | Status: AC | PRN
  Administered 2016-11-20 (×2): 1 mg via INTRAVENOUS

## 2016-11-20 MED ORDER — SODIUM CHLORIDE 0.9 % IV SOLN
INTRAVENOUS | Status: DC
  Administered 2016-11-20: 11:00:00 via INTRAVENOUS

## 2016-11-20 MED ORDER — FLUMAZENIL 0.5 MG/5ML IV SOLN
INTRAVENOUS | Status: AC
  Filled 2016-11-20: qty 5

## 2016-11-20 MED ORDER — FENTANYL CITRATE (PF) 100 MCG/2ML IJ SOLN
INTRAMUSCULAR | Status: AC | PRN
  Administered 2016-11-20 (×2): 50 ug via INTRAVENOUS

## 2016-11-20 MED ORDER — MIDAZOLAM HCL 2 MG/2ML IJ SOLN
INTRAMUSCULAR | Status: AC
  Filled 2016-11-20: qty 4

## 2016-11-20 MED ORDER — FENTANYL CITRATE (PF) 100 MCG/2ML IJ SOLN
INTRAMUSCULAR | Status: AC
  Filled 2016-11-20: qty 4

## 2016-11-20 MED ORDER — NALOXONE HCL 0.4 MG/ML IJ SOLN
INTRAMUSCULAR | Status: DC
Start: 2016-11-20 — End: 2016-11-20
  Filled 2016-11-20: qty 1

## 2016-11-20 NOTE — Consult Note (Signed)
Chief Complaint: Patient was seen in consultation today for CT-guided left retroperitoneal lymph node biopsy  Referring Physician(s): Kale,Gautam Kishore  Supervising Physician: Arne Cleveland  Patient Status: [facility]  History of Present Illness: Katelyn Reeves is a 66 y.o. female with no significant past medical history who has recently been experiencing some persistent back pain with subsequent imaging revealing extensive retroperitoneal lymphadenopathy,hypermetabolic spleen with mild splenomegaly,  hypermetabolic adenopathy in the left supraclavicular, bilateral axillary, peripancreatic, gastrohepatic ligament, celiac, porta hepatis, periaortic and left external iliac chains as well as concerning for lymphoma.She presents today for CT-guided left retroperitoneal lymph node biopsy for further evaluation.  History reviewed. No pertinent past medical history.  Past Surgical History:  Procedure Laterality Date  . ABDOMINAL HYSTERECTOMY  1980   total abdominal hysterectomy    Allergies: Sulfa antibiotics  Medications: Prior to Admission medications   Medication Sig Start Date End Date Taking? Authorizing Provider  HYDROcodone-acetaminophen (NORCO) 5-325 MG tablet Take 1-2 tablets by mouth every 6 (six) hours as needed for moderate pain or severe pain. 11/12/16   Brunetta Genera, MD  senna-docusate (SENNA S) 8.6-50 MG tablet Take 2 tablets by mouth at bedtime as needed for mild constipation or moderate constipation. 11/12/16   Brunetta Genera, MD     History reviewed. No pertinent family history.  Social History   Social History  . Marital status: Divorced    Spouse name: N/A  . Number of children: N/A  . Years of education: N/A   Social History Main Topics  . Smoking status: Never Smoker  . Smokeless tobacco: Never Used  . Alcohol use No  . Drug use: No  . Sexual activity: Not Asked   Other Topics Concern  . None   Social History Narrative  . None       Review of Systems Currently denies fever, headache, chest pain, dyspnea, abdominal pain, nausea, vomiting or abnormal bleeding. She has had night sweats, weight loss, occasional cough and back pain  Vital Signs: BP (!) 144/71 (BP Location: Right Arm)   Pulse 82   Temp 97.2 F (36.2 C) (Oral)   Resp 18   Ht 5' 2"  (1.575 m)   Wt 195 lb 2 oz (88.5 kg)   SpO2 99%   BMI 35.69 kg/m   Physical Exam awake, alert. Chest clear to auscultation bilaterally. Heart with regular rate and rhythm. Abdomen obese, soft, positive bowel sounds, nontender. Lower extremities with no significant edema.  Mallampati Score:     Imaging: Dg Chest 2 View  Result Date: 10/30/2016 CLINICAL DATA:  Shortness of breath with walking, localized enlarged lymph nodes EXAM: CHEST  2 VIEW COMPARISON:  11/07/2003 FINDINGS: Normal heart size, mediastinal contours, and pulmonary vascularity. Lungs clear. No pleural effusion or pneumothorax. Scattered endplate spur formation thoracic spine. Bones appear demineralized. IMPRESSION: No acute abnormalities. Electronically Signed   By: Lavonia Dana M.D.   On: 10/30/2016 08:21   Nm Pet Image Initial (pi) Skull Base To Thigh  Result Date: 11/07/2016 CLINICAL DATA:  Initial treatment strategy for retroperitoneal lymph node enlargement. EXAM: NUCLEAR MEDICINE PET SKULL BASE TO THIGH TECHNIQUE: 10.1 mCi F-18 FDG was injected intravenously. Full-ring PET imaging was performed from the skull base to thigh after the radiotracer. CT data was obtained and used for attenuation correction and anatomic localization. FASTING BLOOD GLUCOSE:  Value: 130 mg/dl COMPARISON:  CT scan dated 10/25/2016 FINDINGS: NECK Left supraclavicular lymph node extending towards the upper mediastinum measures 1.3 cm  in short axis with maximum SUV 20.5. Smaller scattered tiny left supraclavicular lymph nodes are mildly hypermetabolic, maximum SUV of the smaller nodes 5.6. CHEST Hypermetabolic left axillary  adenopathy, the largest of the left axillary lymph nodes measures 1.1 cm in short axis on image 54/4 and has a maximum standard uptake value of 20.2. Tiny but hypermetabolic right axillary lymph nodes. ABDOMEN/PELVIS Diffusely hypermetabolic spleen, representative central splenic SUV 11.8, with splenic volume 550 cc compatible with mild splenomegaly. Hypermetabolic peripancreatic, gastrohepatic ligament, celiac trunk, porta hepatis, periaortic/retroperitoneal, and left external iliac adenopathy. A representative 2.5 cm left periaortic lymph node below the level of the renal vessels on image 114/4 has a maximum standard uptake value of 25.6. The left external iliac node has a short axis diameter of 1.2 cm on image 166/4 and a maximum SUV of 18.1. A right mesenteric lymph node at the level of the upper pelvis measures 0.9 cm in short axis on image 150/4 and has a maximum standard uptake value of 22.9. Other smaller lymph nodes along the root of the mesentery are faintly hypermetabolic. Small but mildly hypermetabolic left inguinal lymph nodes. Mild abdominal aortic atherosclerotic calcification. Stranding in the root of the mesentery. SKELETON No focal hypermetabolic activity to suggest skeletal metastasis. Bridging spurring a left sacroiliac joint. Degenerative arthropathy of both hips. Intervertebral spurring at L2- 3 and L3-4. IMPRESSION: 1. Diffusely hypermetabolic spleen with mild splenomegaly, and hypermetabolic adenopathy in the left supraclavicular, bilateral axillary, peripancreatic, gastrohepatic ligament, celiac, porta hepatis, periaortic, and left external iliac chains. Hypermetabolic mesenteric lymph nodes. Appearance favors lymphoma, tissue diagnosis recommended. The most pathologically enlarged lymph nodes are in the retroperitoneum ; there some tiny left supraclavicular lymph nodes which may be hard to biopsy, the larger left supraclavicular node may be tricky as it extends down to into the upper  mediastinum adjacent to the vessels. 2. Mild atherosclerosis. 3. Degenerative arthropathy of both hips. Intervertebral spurring at L2-3 and L3-4. Electronically Signed   By: Van Clines M.D.   On: 11/07/2016 13:56   Mm Digital Screening Bilateral  Result Date: 11/01/2016 CLINICAL DATA:  Screening. EXAM: DIGITAL SCREENING BILATERAL MAMMOGRAM WITH CAD COMPARISON:  Previous exam(s). ACR Breast Density Category b: There are scattered areas of fibroglandular density. FINDINGS: There are no findings suspicious for malignancy. Images were processed with CAD. IMPRESSION: No mammographic evidence of malignancy. A result letter of this screening mammogram will be mailed directly to the patient. RECOMMENDATION: Screening mammogram in one year. (Code:SM-B-01Y) BI-RADS CATEGORY  1: Negative. Electronically Signed   By: Everlean Alstrom M.D.   On: 11/01/2016 14:16   Ct Renal Stone Study  Result Date: 10/25/2016 CLINICAL DATA:  Right flank pain radiating into the right groin for 3 weeks. EXAM: CT ABDOMEN AND PELVIS WITHOUT CONTRAST TECHNIQUE: Multidetector CT imaging of the abdomen and pelvis was performed following the standard protocol without IV contrast. COMPARISON:  None. FINDINGS: Lower chest: Lung bases are clear. No pleural or pericardial effusion. Hepatobiliary: The liver border has a nodular appearance compatible with cirrhosis. No focal lesion is identified. The gallbladder and biliary tree appear normal. Pancreas: Atrophic without focal lesion. Spleen: Normal in size without focal abnormality. Adrenals/Urinary Tract: Bifid right renal collecting system is noted with mild dilatation of the lower pole moiety. No hydronephrosis or obstructing stone. Urinary bladder is unremarkable. Stomach/Bowel: Stomach is within normal limits. Appendix appears normal. No evidence of bowel wall thickening, distention, or inflammatory changes. Vascular/Lymphatic: Extensive retroperitoneal lymphadenopathy is identified. Left  para-aortic node on image 38  measures 2.3 x 2.4 cm. Interaortocaval node on image 31 measures 2.4 by 1.6 cm. Left external iliac node on image 69 measures 1.2 cm. Haziness of the mesenteries noted. Scattered aortic atherosclerotic calcifications without aneurysm are seen. Reproductive: Status post hysterectomy. Other: No fluid collection. Musculoskeletal: No lytic or sclerotic bony lesion. Calcified disc protrusions L2-3 and L3-4 noted. Postoperative change lower lumbar spine is noted. IMPRESSION: Extensive retroperitoneal lymphadenopathy most consistent with lymphoproliferative disease such as chronic lymphocytic leukemia. Nodular border of the liver compatible with cirrhosis. Negative for urinary tract stone. Scattered aortic atherosclerosis without aneurysm. These results will be called to the ordering clinician or representative by the Radiologist Assistant, and communication documented in the PACS or zVision Dashboard. Electronically Signed   By: Inge Rise M.D.   On: 10/25/2016 14:32    Labs:  CBC:  Recent Labs  11/12/16 1033 11/20/16 1104  WBC 5.5 6.5  HGB 11.8 11.7*  HCT 35.5 34.6*  PLT 193 275    COAGS:  Recent Labs  11/12/16 1033 11/20/16 1104  INR 1.00* 1.02  APTT  --  30    BMP:  Recent Labs  11/12/16 1033  NA 140  K 4.2  CO2 24  GLUCOSE 107  BUN 11.9  CALCIUM 9.6  CREATININE 0.8    LIVER FUNCTION TESTS:  Recent Labs  11/12/16 1033  BILITOT 0.49  AST 22  ALT 16  ALKPHOS 95  PROT 8.3  ALBUMIN 4.1    TUMOR MARKERS: No results for input(s): AFPTM, CEA, CA199, CHROMGRNA in the last 8760 hours.  Assessment and Plan: 66 y.o. female with no significant past medical history who has recently been experiencing some persistent back pain with subsequent imaging revealing extensive retroperitoneal lymphadenopathy,hypermetabolic spleen with mild splenomegaly,  hypermetabolic adenopathy in the left supraclavicular, bilateral axillary, peripancreatic,  gastrohepatic ligament, celiac, porta hepatis, periaortic and left external iliac chains as well as concerning for lymphoma.She presents today for CT-guided left retroperitoneal lymph node biopsy for further evaluation.Risks and benefits discussed with the patient/brother including, but not limited to bleeding, infection, damage to adjacent structures or low yield requiring additional tests. All of the patient's questions were answered, patient is agreeable to proceed. Consent signed and in chart.     Thank you for this interesting consult.  I greatly enjoyed meeting Katelyn Reeves and look forward to participating in their care.  A copy of this report was sent to the requesting provider on this date.  Electronically Signed: D. Rowe Robert 11/20/2016, 11:41 AM   I spent a total of  25 minutes   in face to face in clinical consultation, greater than 50% of which was counseling/coordinating care for CT-guided left retroperitoneal lymph node biopsy

## 2016-11-20 NOTE — CV Procedure (Signed)
CT L paraaortic core LN bx No complication No blood loss. See complete dictation in [facility].

## 2016-11-20 NOTE — Discharge Instructions (Signed)
Needle Biopsy, Care After These instructions give you information about caring for yourself after your procedure. Your doctor may also give you more specific instructions. Call your doctor if you have any problems or questions after your procedure. Follow these instructions at home:  Rest as told by your doctor.  Take medicines only as told by your doctor.  There are many different ways to close and cover the biopsy site, including stitches (sutures), skin glue, and adhesive strips. Follow instructions from your doctor about:  How to take care of your biopsy site.  When and how you should change your bandage (dressing).  When you should remove your dressing.  Removing whatever was used to close your biopsy site.  Check your biopsy site every day for signs of infection. Watch for:  Redness, swelling, or pain.  Fluid, blood, or pus. Contact a doctor if:  You have a fever.  You have redness, swelling, or pain at the biopsy site, and it lasts longer than a few days.  You have fluid, blood, or pus coming from the biopsy site.  You feel sick to your stomach (nauseous).  You throw up (vomit). Get help right away if:  You are short of breath.  You have trouble breathing.  Your chest hurts.  You feel dizzy or you pass out (faint).  You have bleeding that does not stop with pressure or a bandage.  You cough up blood.  Your belly (abdomen) hurts. This information is not intended to replace advice given to you by your health care provider. Make sure you discuss any questions you have with your health care provider. Document Released: 08/22/2008 Document Revised: 02/15/2016 Document Reviewed: 09/05/2014 Elsevier Interactive Patient Education  2017 [employer]. Moderate Conscious Sedation, Adult, Care After These instructions provide you with information about caring for yourself after your procedure. Your health care provider may also give you more specific instructions.  Your treatment has been planned according to current medical practices, but problems sometimes occur. Call your health care provider if you have any problems or questions after your procedure. What can I expect after the procedure? After your procedure, it is common:  To feel sleepy for several hours.  To feel clumsy and have poor balance for several hours.  To have poor judgment for several hours.  To vomit if you eat too soon. Follow these instructions at home: For at least 24 hours after the procedure:    Do not:  Participate in activities where you could fall or become injured.  Drive.  Use heavy machinery.  Drink alcohol.  Take sleeping pills or medicines that cause drowsiness.  Make important decisions or sign legal documents.  Take care of children on your own.  Rest. Eating and drinking   Follow the diet recommended by your health care provider.  If you vomit:  Drink water, juice, or soup when you can drink without vomiting.  Make sure you have little or no nausea before eating solid foods. General instructions   Have a responsible adult stay with you until you are awake and alert.  Take over-the-counter and prescription medicines only as told by your health care provider.  If you smoke, do not smoke without supervision.  Keep all follow-up visits as told by your health care provider. This is important. Contact a health care provider if:  You keep feeling nauseous or you keep vomiting.  You feel light-headed.  You develop a rash.  You have a fever. Get help right away if:  You have trouble breathing. This information is not intended to replace advice given to you by your health care provider. Make sure you discuss any questions you have with your health care provider. Document Released: 06/30/2013 Document Revised: 02/12/2016 Document Reviewed: 12/30/2015 Elsevier Interactive Patient Education  2017 [employer].

## 2016-11-25 ENCOUNTER — Ambulatory Visit: Payer: BLUE CROSS/BLUE SHIELD | Admitting: Nutrition

## 2016-11-25 ENCOUNTER — Encounter: Payer: Self-pay | Admitting: Hematology

## 2016-11-25 ENCOUNTER — Ambulatory Visit (HOSPITAL_BASED_OUTPATIENT_CLINIC_OR_DEPARTMENT_OTHER): Payer: BLUE CROSS/BLUE SHIELD | Admitting: Hematology

## 2016-11-25 VITALS — BP 136/75 | HR 81 | Temp 99.0°F | Resp 16 | Wt 196.2 lb

## 2016-11-25 DIAGNOSIS — C8598 Non-Hodgkin lymphoma, unspecified, lymph nodes of multiple sites: Secondary | ICD-10-CM

## 2016-11-25 DIAGNOSIS — R591 Generalized enlarged lymph nodes: Secondary | ICD-10-CM

## 2016-11-25 NOTE — Progress Notes (Signed)
66 year old female, a patient of Dr. Irene Limbo, being worked up for potential non-Hodgkin's lymphoma.  Past medical history includes hypertension and constipation.  Medications include Senna S.  Labs include albumin 4.1.  Height: 5 foot 2 inches. Weight: 196.2 pounds March 5. Usual body weight: 210-215 pounds. BMI: 35.89.  Patient reports she has a poor appetite and has noticed approximate 10 pound weight loss over one month. She is drinking 1 ensure a day. She has lost her taste for most meat but enjoys dried beans, vegetables, fruit. Reports she has been tolerating juices and milk. Prefers to limit sodium intake in foods. Reports early satiety.  Nutrition diagnosis:  Unintended weight loss related to inadequate oral intake as evidenced by approximately 10 pound weight loss over one month.  Intervention: Educated patient to increase calories and protein in 6 small meals and snacks daily. Verbalized importance of weight maintenance. Recommended increased protein foods. Questions were answered.  Teach back method used.  Contact information provided fact sheets were given. Samples were provided. Recommended 1 to 2 oral nutrition supplements daily.  Monitoring, evaluation, goals: Patient will tolerate adequate calories and protein for minimizing weight loss.  Next visit: To be scheduled as needed.  **Disclaimer: This note was dictated with voice recognition software. Similar sounding words can inadvertently be transcribed and this note may contain transcription errors which may not have been corrected upon publication of note.**

## 2016-11-26 ENCOUNTER — Encounter: Payer: Self-pay | Admitting: Hematology

## 2016-11-26 NOTE — Progress Notes (Signed)
Marland Kitchen    HEMATOLOGY/ONCOLOGY CLINIC NOTE  Date of Service: 11/26/2016  Patient Care Team: Asencion Noble, MD as PCP - General (Internal Medicine)  CHIEF COMPLAINTS/PURPOSE OF CONSULTATION:  Suspected Non Hodgkins Lymphoma  HISTORY OF PRESENTING ILLNESS:   Katelyn Reeves is a wonderful 66 y.o. female who has been referred to [place] by Dr .Asencion Noble, MD for evaluation and management of suspected non-Hodgkin's lymphoma.  Patient notes that she presented with back pain for 4-6 weeks and eventually had pain radiating to her right inguinal area. She notes that she has had interstitial cystitis in the past. She was thought to have a urinary tract infection and was treated with Macrodantin. Says the pain got worse and started radiating into the right groin she had a CT of the abdomen renal stone protocol on 10/25/2016 which showed extensive retroperitoneal lymphadenopathy concerning for lymphoproliferative disorder. She was incidentally noted to have a nodular border of the liver with possible liver cirrhosis. No urinary stones noted.  She subsequently had a PET CT scan ordered by her primary care physician which was done on 11/07/2016. This showed diffuse hypermetabolic spleen with mild splenomegaly. Hypermetabolic adenopathy in the left supraclavicular, bilateral axillary, peripancreatic, gastrohepatic ligament, celiac, porta hepatis, periaortic, and left external iliac chains. Hypermetabolic mesenteric lymph nodes. Appearance favors lymphoma.  Patient notes some night sweats for 2 weeks. Has lost about 10 pounds in the last month. No acute fevers or chills. Notes the back pain is controlled with when necessary Vicodin.  INTERVAL HISTORY  Ms Tinner is here for her scheduled followup of her biopsy results and for further mx of her suspected lymphoma . 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 We discussed  additional sampling options including rpt imaging guided core need biopsy vs laparoscopic surgical biopsy. She chooses rpt core needle biopsy with an understanding that if tissue is predominantly necrotic we might need surgical biopsy. No other acute new symptoms since her last clinic visit.  MEDICAL HISTORY:  #1 fibrocystic disease of the breast #2 hypertension #3 constipation #4 duplicated right sided ureters #5 imaging concern for possible liver cirrhosis  SURGICAL HISTORY:  #1 total abdominal hysterectomy with bilateral salpingo-oophorectomy for ovarian cyst. #2 Lipoma removal left upper back #3 hemorrhoidectomy  SOCIAL HISTORY: Social History   Social History  . Marital status: Divorced    Spouse name: N/A  . Number of children: N/A  . Years of education: N/A   Occupational History  . Not on file.   Social History Main Topics  . Smoking status: Never Smoker  . Smokeless tobacco: Never Used  . Alcohol use No  . Drug use: No  . Sexual activity: Not on file   Other Topics Concern  . Not on file   Social History Narrative  . No narrative on file  Nonsmoker no issues with alcohol use or drug use. 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   FAMILY HISTORY:  Notes her mother had ovarian cancer followed by leukemia and died at age 35 years Maternal aunt gastric cancer  ALLERGIES:  is allergic to sulfa antibiotics.  MEDICATIONS:  Current Outpatient Prescriptions  Medication Sig Dispense Refill  . HYDROcodone-acetaminophen (NORCO) 5-325 MG tablet Take 1-2 tablets by mouth every 6 (six) hours as needed for moderate pain or severe pain. 30 tablet 0  . senna-docusate (SENNA S) 8.6-50 MG tablet Take 2 tablets  by mouth at bedtime as needed for mild constipation or moderate constipation. 60 tablet 1   No current facility-administered medications for this visit.     REVIEW OF SYSTEMS:    10 Point review of Systems was done is  negative except as noted above.  PHYSICAL EXAMINATION: ECOG PERFORMANCE STATUS: 1 - Symptomatic but completely ambulatory  . Vitals:   11/25/16 1436  BP: 136/75  Pulse: 81  Resp: 16  Temp: 99 F (37.2 C)   Filed Weights   11/25/16 1436  Weight: 196 lb 3.2 oz (89 kg)   .Body mass index is 35.89 kg/m.  GENERAL:alert, in no acute distress and comfortable SKIN: skin color, texture, turgor are normal, no rashes or significant lesions EYES: normal, conjunctiva are pink and non-injected, sclera clear OROPHARYNX:no exudate, no erythema and lips, buccal mucosa, and tongue normal  NECK: supple, no JVD, thyroid normal size, non-tender, without nodularity LYMPH: palpable left supraclavicular and left axillary lymph node. Smaller right axillary lymph node. LUNGS: clear to auscultation with normal respiratory effort HEART: regular rate & rhythm,  no murmurs and no lower extremity edema ABDOMEN: abdomen soft, non-tender, normoactive bowel sounds , mild tenderness to palpation in left upper abdomen. Musculoskeletal: no cyanosis of digits and no clubbing  PSYCH: alert & oriented x 3 with fluent speech NEURO: no focal motor/sensory deficits  LABORATORY DATA:  I have reviewed the data as listed  . CBC Latest Ref Rng & Units 11/20/2016 11/12/2016  WBC 4.0 - 10.5 K/uL 6.5 5.5  Hemoglobin 12.0 - 15.0 g/dL 11.7(L) 11.8  Hematocrit 36.0 - 46.0 % 34.6(L) 35.5  Platelets 150 - 400 K/uL 275 193   CBC    Component Value Date/Time   WBC 6.5 11/20/2016 1104   RBC 4.00 11/20/2016 1104   HGB 11.7 (L) 11/20/2016 1104   HGB 11.8 11/12/2016 1033   HCT 34.6 (L) 11/20/2016 1104   HCT 35.5 11/12/2016 1033   PLT 275 11/20/2016 1104   PLT 193 11/12/2016 1033   MCV 86.5 11/20/2016 1104   MCV 87.7 11/12/2016 1033   MCH 29.3 11/20/2016 1104   MCHC 33.8 11/20/2016 1104   RDW 12.6 11/20/2016 1104   RDW 12.8 11/12/2016 1033   LYMPHSABS 1.3 11/12/2016 1033   MONOABS 0.3 11/12/2016 1033   EOSABS 0.2  11/12/2016 1033   BASOSABS 0.1 11/12/2016 1033   . CMP Latest Ref Rng & Units 11/12/2016  Glucose 70 - 140 mg/dl 107  BUN 7.0 - 26.0 mg/dL 11.9  Creatinine 0.6 - 1.1 mg/dL 0.8  Sodium 136 - 145 mEq/L 140  Potassium 3.5 - 5.1 mEq/L 4.2  CO2 22 - 29 mEq/L 24  Calcium 8.4 - 10.4 mg/dL 9.6  Total Protein 6.4 - 8.3 g/dL 8.3  Total Bilirubin 0.20 - 1.20 mg/dL 0.49  Alkaline Phos 40 - 150 U/L 95  AST 5 - 34 U/L 22  ALT 0 - 55 U/L 16   . Lab Results  Component Value Date   LDH 210 11/12/2016   Component     Latest Ref Rng & Units 11/12/2016  Hep C Virus Ab     0.0 - 0.9 s/co ratio <0.1  Hepatitis B Surface Ag     Negative Negative  Hep B Core Ab, Tot     Negative Negative   Component     Latest Ref Rng & Units 11/12/2016  Protime     10.6 - 13.4 Seconds 12.0  INR     2.00 - 3.50 1.00 (  L)  Lovenox      No  APTT     24 - 33 sec 27        *Schuyler*                  *[facility] [employer].                        [place], [place] [address]                            [phone]  ------------------------------------------------------------------- Transthoracic Echocardiography  Patient:    Larken, Urias MR #:       [id number] Study Date: 11/19/2016 Gender:     F Age:        40 Height:     160 cm Weight:     90.2 kg BSA:        2.04 m^2 Pt. Status: Room:   SONOGRAPHER  Tresa Res, RDCS  PERFORMING   [facility], Outpatient  ATTENDING    Roselee Nova     [place], [place] Kishore  REFERRING    [place], [place] Kishore  cc:  ------------------------------------------------------------------- LV EF: 60% -   65%  ------------------------------------------------------------------- Indications:      V58.11 Chemotherapy Evaluation.  ------------------------------------------------------------------- History:   Risk factors:  Lymphoma.  Obese.  ------------------------------------------------------------------- Study Conclusions  - Left ventricle: The cavity size was normal. There was mild   concentric hypertrophy. Systolic function was normal. The   estimated ejection fraction was in the range of 60% to 65%. Wall   motion was normal; there were no regional wall motion   abnormalities. Doppler parameters are consistent with abnormal   left ventricular relaxation (grade 1 diastolic dysfunction).   Doppler parameters are consistent with indeterminate ventricular   filling pressure. - Aortic valve: Transvalvular velocity was within the normal range.   There was no stenosis. There was trivial regurgitation. - Mitral valve: Transvalvular velocity was within the normal range.   There was no evidence for stenosis. There was trivial   regurgitation. - Right ventricle: The cavity size was normal. Wall thickness was   normal. Systolic function was normal. - Atrial septum: No defect or patent foramen ovale was identified   by color flow Doppler. - Tricuspid valve: There was no regurgitation. - Global longitudinal strain -17.6%   RADIOGRAPHIC STUDIES: I have personally reviewed the radiological images as listed and agreed with the findings in the report. Dg Chest 2 View  Result Date: 10/30/2016 CLINICAL DATA:  Shortness of breath with walking, localized enlarged lymph nodes EXAM: CHEST  2 VIEW COMPARISON:  11/07/2003 FINDINGS: Normal heart size, mediastinal contours, and pulmonary vascularity. Lungs clear. No pleural effusion or pneumothorax. Scattered endplate spur formation thoracic spine. Bones appear demineralized. IMPRESSION: No acute abnormalities. Electronically Signed   By: Lavonia Dana M.D.   On: 10/30/2016 08:21   Nm Pet Image Initial (pi) Skull Base To Thigh  Result Date: 11/07/2016 CLINICAL DATA:  Initial treatment strategy for retroperitoneal lymph node enlargement. EXAM: NUCLEAR MEDICINE PET SKULL BASE TO THIGH  TECHNIQUE: 10.1 mCi F-18 FDG was injected intravenously. Full-ring PET imaging was performed from the skull base to thigh after the radiotracer. CT data was obtained and used for attenuation correction  and anatomic localization. FASTING BLOOD GLUCOSE:  Value: 130 mg/dl COMPARISON:  CT scan dated 10/25/2016 FINDINGS: NECK Left supraclavicular lymph node extending towards the upper mediastinum measures 1.3 cm in short axis with maximum SUV 20.5. Smaller scattered tiny left supraclavicular lymph nodes are mildly hypermetabolic, maximum SUV of the smaller nodes 5.6. CHEST Hypermetabolic left axillary adenopathy, the largest of the left axillary lymph nodes measures 1.1 cm in short axis on image 54/4 and has a maximum standard uptake value of 20.2. Tiny but hypermetabolic right axillary lymph nodes. ABDOMEN/PELVIS Diffusely hypermetabolic spleen, representative central splenic SUV 11.8, with splenic volume 550 cc compatible with mild splenomegaly. Hypermetabolic peripancreatic, gastrohepatic ligament, celiac trunk, porta hepatis, periaortic/retroperitoneal, and left external iliac adenopathy. A representative 2.5 cm left periaortic lymph node below the level of the renal vessels on image 114/4 has a maximum standard uptake value of 25.6. The left external iliac node has a short axis diameter of 1.2 cm on image 166/4 and a maximum SUV of 18.1. A right mesenteric lymph node at the level of the upper pelvis measures 0.9 cm in short axis on image 150/4 and has a maximum standard uptake value of 22.9. Other smaller lymph nodes along the root of the mesentery are faintly hypermetabolic. Small but mildly hypermetabolic left inguinal lymph nodes. Mild abdominal aortic atherosclerotic calcification. Stranding in the root of the mesentery. SKELETON No focal hypermetabolic activity to suggest skeletal metastasis. Bridging spurring a left sacroiliac joint. Degenerative arthropathy of both hips. Intervertebral spurring at L2- 3 and  L3-4. IMPRESSION: 1. Diffusely hypermetabolic spleen with mild splenomegaly, and hypermetabolic adenopathy in the left supraclavicular, bilateral axillary, peripancreatic, gastrohepatic ligament, celiac, porta hepatis, periaortic, and left external iliac chains. Hypermetabolic mesenteric lymph nodes. Appearance favors lymphoma, tissue diagnosis recommended. The most pathologically enlarged lymph nodes are in the retroperitoneum ; there some tiny left supraclavicular lymph nodes which may be hard to biopsy, the larger left supraclavicular node may be tricky as it extends down to into the upper mediastinum adjacent to the vessels. 2. Mild atherosclerosis. 3. Degenerative arthropathy of both hips. Intervertebral spurring at L2-3 and L3-4. Electronically Signed   By: Van Clines M.D.   On: 11/07/2016 13:56   Ct Biopsy  Result Date: 11/20/2016 CLINICAL DATA:  Generalized lymphadenopathy EXAM: CT GUIDED CORE BIOPSY OF LEFT RETROPERITONEAL ADENOPATHY ANESTHESIA/SEDATION: Intravenous Fentanyl and Versed were administered as conscious sedation during continuous monitoring of the patient's level of consciousness and physiological / cardiorespiratory status by the radiology RN, with a total moderate sedation time of TEN minutes. PROCEDURE: The procedure risks, benefits, and alternatives were explained to the patient. Questions regarding the procedure were encouraged and answered. The patient understands and consents to the procedure. patient placed prone. select axial scans through the abdomen obtained, and the left para-aortic lymphadenopathy was localized. an appropriate skin entry site was determined and marked. The operative field was prepped with chlorhexidinein a sterile fashion, and a sterile drape was applied covering the operative field. A sterile gown and sterile gloves were used for the procedure. Local anesthesia was provided with 1% Lidocaine. Under CT fluoroscopic guidance, a 17 gauge trocar needle was  advanced to the margin of the lesion. Once needle tip position was confirmed, coaxial 18-gauge core biopsy samples were obtained, submitted in saline to surgical pathology. The guide needle was removed. Postprocedure scans show no hemorrhage or other apparent complication. The patient tolerated the procedure well. COMPLICATIONS: None immediate FINDINGS: Bulky left para-aortic retroperitoneal adenopathy was again localized. Representative core biopsy samples  obtained under CT fluoroscopic guidance as above. IMPRESSION: 1. Technically successful CT-guided core biopsy, left para-aortic retroperitoneal adenopathy. Electronically Signed   By: Lucrezia Europe M.D.   On: 11/20/2016 14:27   Mm Digital Screening Bilateral  Result Date: 11/01/2016 CLINICAL DATA:  Screening. EXAM: DIGITAL SCREENING BILATERAL MAMMOGRAM WITH CAD COMPARISON:  Previous exam(s). ACR Breast Density Category b: There are scattered areas of fibroglandular density. FINDINGS: There are no findings suspicious for malignancy. Images were processed with CAD. IMPRESSION: No mammographic evidence of malignancy. A result letter of this screening mammogram will be mailed directly to the patient. RECOMMENDATION: Screening mammogram in one year. (Code:SM-B-01Y) BI-RADS CATEGORY  1: Negative. Electronically Signed   By: Everlean Alstrom M.D.   On: 11/01/2016 14:16    ASSESSMENT & PLAN:   66 yo caucasian female with is actively working as a Forensic scientist with   1) Generalized FDG avid lymphadenopathy - predominantly in the abdomen/Retroperitoneum. Also noted to have left supraclavicular and bilateral axillary left more than right FDG avid lymphadenopathy. Overall presentation is certainly concerning for lymphoma. Based on imaging this would represent at least Stage III disease if this were a lymphoma. LDH level is not significantly elevated. Blood counts are stable. PET/CT scan does show fairly active disease which is FDG avid with FDG avid  splenomegaly. Patient has some constitutional symptoms with about a 10 pound weight loss and some night sweats. Hepatitis profile negative. No other obvious new focal symptoms.  CT guided bx of para-aortic LN -- is concerning for high grade B cell lymphoproliferative process but was not definitive due to significant necrosis in the sample.  Patient has had an ECHo which shows nl EF Plan -I discussed the pathology findings in details -after discussion of additional sampling options a stat rpt Imaging guided core needle biopsy was ordered. -patient is aware that she might  Need a laparoscopic surgical biopsy if rpt sample is non diagnostic as well.  All of the patients questions were answered with apparent satisfaction. The patient knows to call the clinic with any problems, questions or concerns.  I spent 15 minutes counseling the patient face to face. The total time spent in the appointment was 20 minutes and more than 50% was on counseling and direct patient cares.    Sullivan Lone MD [place] AAHIVMS [facility] Hematology/Oncology Physician [facility]  (Office):       [phone] (Work cell):  [phone] (Fax):           [phone]  11/26/2016 11:48 PM

## 2016-11-27 ENCOUNTER — Ambulatory Visit (HOSPITAL_COMMUNITY)
Admission: RE | Admit: 2016-11-27 | Discharge: 2016-11-27 | Disposition: A | Discharge status: Home / Self Care | Payer: BLUE CROSS/BLUE SHIELD | Source: Ambulatory Visit | Attending: Hematology | Admitting: Hematology

## 2016-11-27 ENCOUNTER — Encounter (HOSPITAL_COMMUNITY): Payer: Self-pay

## 2016-11-27 DIAGNOSIS — Z9889 Other specified postprocedural states: Secondary | ICD-10-CM | POA: Diagnosis not present

## 2016-11-27 DIAGNOSIS — Z79899 Other long term (current) drug therapy: Secondary | ICD-10-CM | POA: Diagnosis not present

## 2016-11-27 DIAGNOSIS — R05 Cough: Secondary | ICD-10-CM | POA: Insufficient documentation

## 2016-11-27 DIAGNOSIS — Z90710 Acquired absence of both cervix and uterus: Secondary | ICD-10-CM | POA: Diagnosis not present

## 2016-11-27 DIAGNOSIS — C8598 Non-Hodgkin lymphoma, unspecified, lymph nodes of multiple sites: Secondary | ICD-10-CM | POA: Diagnosis not present

## 2016-11-27 DIAGNOSIS — R591 Generalized enlarged lymph nodes: Secondary | ICD-10-CM | POA: Diagnosis not present

## 2016-11-27 DIAGNOSIS — Z888 Allergy status to other drugs, medicaments and biological substances status: Secondary | ICD-10-CM | POA: Insufficient documentation

## 2016-11-27 LAB — CBC
HEMATOCRIT: 35.9 % — AB (ref 36.0–46.0)
HEMOGLOBIN: 12 g/dL (ref 12.0–15.0)
MCH: 28.9 pg (ref 26.0–34.0)
MCHC: 33.4 g/dL (ref 30.0–36.0)
MCV: 86.5 fL (ref 78.0–100.0)
PLATELETS: 249 10*3/uL (ref 150–400)
RBC: 4.15 MIL/uL (ref 3.87–5.11)
RDW: 12.8 % (ref 11.5–15.5)
WBC: 5.7 10*3/uL (ref 4.0–10.5)

## 2016-11-27 MED ORDER — NALOXONE HCL 0.4 MG/ML IJ SOLN
INTRAMUSCULAR | Status: AC
  Filled 2016-11-27: qty 1

## 2016-11-27 MED ORDER — MIDAZOLAM HCL 2 MG/2ML IJ SOLN
INTRAMUSCULAR | Status: AC
  Filled 2016-11-27: qty 4

## 2016-11-27 MED ORDER — FLUMAZENIL 0.5 MG/5ML IV SOLN
INTRAVENOUS | Status: AC
  Filled 2016-11-27: qty 5

## 2016-11-27 MED ORDER — HYDROCODONE-ACETAMINOPHEN 5-325 MG PO TABS
1.0000 | ORAL_TABLET | ORAL | Status: DC | PRN
  Administered 2016-11-27: 1 via ORAL
  Filled 2016-11-27: qty 1

## 2016-11-27 MED ORDER — SODIUM CHLORIDE 0.9 % IV SOLN
INTRAVENOUS | Status: DC
  Administered 2016-11-27: 11:00:00 via INTRAVENOUS

## 2016-11-27 MED ORDER — MIDAZOLAM HCL 2 MG/2ML IJ SOLN
INTRAMUSCULAR | Status: AC | PRN
  Administered 2016-11-27: 1 mg via INTRAVENOUS
  Administered 2016-11-27 (×2): 0.5 mg via INTRAVENOUS

## 2016-11-27 MED ORDER — FENTANYL CITRATE (PF) 100 MCG/2ML IJ SOLN
INTRAMUSCULAR | Status: AC
  Filled 2016-11-27: qty 4

## 2016-11-27 MED ORDER — FENTANYL CITRATE (PF) 100 MCG/2ML IJ SOLN
INTRAMUSCULAR | Status: AC | PRN
  Administered 2016-11-27 (×2): 25 ug via INTRAVENOUS
  Administered 2016-11-27: 50 ug via INTRAVENOUS

## 2016-11-27 NOTE — H&P (Signed)
Referring Physician(s): Brunetta Genera  Supervising Physician: Markus Daft  Patient Status:  [facility] OP  Chief Complaint: "I'm here for a repeat biopsy"   Subjective: Patient familiar to IR service from recent left retroperitoneal lymph node biopsy on 11/20/16. Pathology revealed lymphoid tissue with extensive necrosis. Flow cytometry could not be performed secondary to lack of adequate viable cells. Overall findings were suspicious but not definitive for a B cell lymphoproliferative process. She presents again today for repeat lymph node biopsy for confirmation. She currently denies fever, headache, chest pain, dyspnea, nausea, vomiting or abnormal bleeding. She does have some abdominal fullness, and has experienced night sweats, weight loss, intermittent back pain and occasional cough. Past Medical History:  Diagnosis Date  . Back pain   . Headache    migraines  . Swollen lymph nodes    left axilla, rectoperitonal    Past Surgical History:  Procedure Laterality Date  . ABDOMINAL HYSTERECTOMY  1980   total abdominal hysterectomy with tubes and ovaries  . BREAST SURGERY Left 1980's   breast biopsy  . CYST EXCISION Left 2017   left scapula  . HEMORRHOID SURGERY  1970's  . TUBAL LIGATION  1980's   before hysterectomy  . WRIST GANGLION EXCISION Left 1970's      Allergies: Sulfa antibiotics  Medications: Prior to Admission medications   Medication Sig Start Date End Date Taking? Authorizing Provider  HYDROcodone-acetaminophen (NORCO) 5-325 MG tablet Take 1-2 tablets by mouth every 6 (six) hours as needed for moderate pain or severe pain. 11/12/16   Brunetta Genera, MD  senna-docusate (SENNA S) 8.6-50 MG tablet Take 2 tablets by mouth at bedtime as needed for mild constipation or moderate constipation. 11/12/16   Brunetta Genera, MD     Vital Signs: BP 136/84 (BP Location: Right Arm)   Pulse 84   Temp 98.7 F (37.1 C) (Oral)   Resp 16   Ht 5' 2"  (1.575 m)    Wt 196 lb 3.2 oz (89 kg)   SpO2 97%   BMI 35.89 kg/m   Physical Exam  awake, alert. Chest clear to auscultation bilaterally. Heart with regular rate and rhythm. Abdomen obese, soft, positive bowel sounds, not significantly tender but pt does have feeling of fullness. Lower extremities with  no significant edema.  Imaging: No results found.  Labs:  CBC:  Recent Labs  11/12/16 1033 11/20/16 1104  WBC 5.5 6.5  HGB 11.8 11.7*  HCT 35.5 34.6*  PLT 193 275    COAGS:  Recent Labs  11/12/16 1033 11/20/16 1104  INR 1.00* 1.02  APTT  --  30    BMP:  Recent Labs  11/12/16 1033  NA 140  K 4.2  CO2 24  GLUCOSE 107  BUN 11.9  CALCIUM 9.6  CREATININE 0.8    LIVER FUNCTION TESTS:  Recent Labs  11/12/16 1033  BILITOT 0.49  AST 22  ALT 16  ALKPHOS 95  PROT 8.3  ALBUMIN 4.1    Assessment and Plan: 66 y.o. female with no significant past medical history who has recently been experiencing some persistent back pain with subsequent imaging revealing extensive retroperitoneal lymphadenopathy,hypermetabolic spleen with mild splenomegaly,  hypermetabolic adenopathy in the left supraclavicular, bilateral axillary, peripancreatic, gastrohepatic ligament, celiac, porta hepatis, periaortic and left external iliac chains as well as concerning for lymphoma. She is s/p CT-guided left retroperitoneal lymph node biopsy 11/20/16 which revealed lymphoid tissue with extensive necrosis. Flow cytometry could not be performed secondary to lack of  adequate viable cells. Overall findings were suspicious but not definitive for a B cell lymphoproliferative process. She presents again today for repeat lymph node biopsy for confirmation.Risks and benefits discussed with the patient/brother including, but not limited to bleeding, infection, damage to adjacent structures or low yield requiring additional tests. All of the patient's questions were answered, patient is agreeable to proceed. Consent  signed and in chart.     Electronically Signed: D. Rowe Robert 11/27/2016, 11:11 AM   I spent a total of 20 minutes at the the patient's bedside AND on the patient's hospital floor or unit, greater than 50% of which was counseling/coordinating care for image guided lymph node biopsy

## 2016-11-27 NOTE — Discharge Instructions (Signed)
Needle Biopsy, Care After These instructions give you information about caring for yourself after your procedure. Your doctor may also give you more specific instructions. Call your doctor if you have any problems or questions after your procedure. Follow these instructions at home:  Rest as told by your doctor.  Take medicines only as told by your doctor.  There are many different ways to close and cover the biopsy site, including stitches (sutures), skin glue, and adhesive strips. Follow instructions from your doctor about:  How to take care of your biopsy site.  When and how you should change your bandage (dressing).  When you should remove your dressing.  Removing whatever was used to close your biopsy site.  Check your biopsy site every day for signs of infection. Watch for:  Redness, swelling, or pain.  Fluid, blood, or pus. Contact a doctor if:  You have a fever.  You have redness, swelling, or pain at the biopsy site, and it lasts longer than a few days.  You have fluid, blood, or pus coming from the biopsy site.  You feel sick to your stomach (nauseous).  You throw up (vomit). Get help right away if:  You are short of breath.  You have trouble breathing.  Your chest hurts.  You feel dizzy or you pass out (faint).  You have bleeding that does not stop with pressure or a bandage.  You cough up blood.  Your belly (abdomen) hurts. This information is not intended to replace advice given to you by your health care provider. Make sure you discuss any questions you have with your health care provider. Document Released: 08/22/2008 Document Revised: 02/15/2016 Document Reviewed: 09/05/2014 Elsevier Interactive Patient Education  2017 [place].   Moderate Conscious Sedation, Adult, Care After These instructions provide you with information about caring for yourself after your procedure. Your health care provider may also give you more specific instructions.  Your treatment has been planned according to current medical practices, but problems sometimes occur. Call your health care provider if you have any problems or questions after your procedure. What can I expect after the procedure? After your procedure, it is common:  To feel sleepy for several hours.  To feel clumsy and have poor balance for several hours.  To have poor judgment for several hours.  To vomit if you eat too soon. Follow these instructions at home: For at least 24 hours after the procedure:    Do not:  Participate in activities where you could fall or become injured.  Drive.  Use heavy machinery.  Drink alcohol.  Take sleeping pills or medicines that cause drowsiness.  Make important decisions or sign legal documents.  Take care of children on your own.  Rest. Eating and drinking   Follow the diet recommended by your health care provider.  If you vomit:  Drink water, juice, or soup when you can drink without vomiting.  Make sure you have little or no nausea before eating solid foods. General instructions   Have a responsible adult stay with you until you are awake and alert.  Take over-the-counter and prescription medicines only as told by your health care provider.  If you smoke, do not smoke without supervision.  Keep all follow-up visits as told by your health care provider. This is important. Contact a health care provider if:  You keep feeling nauseous or you keep vomiting.  You feel light-headed.  You develop a rash.  You have a fever. Get help right  away if:  You have trouble breathing. This information is not intended to replace advice given to you by your health care provider. Make sure you discuss any questions you have with your health care provider. Document Released: 06/30/2013 Document Revised: 02/12/2016 Document Reviewed: 12/30/2015 Elsevier Interactive Patient Education  2017 [employer].

## 2016-11-27 NOTE — Procedures (Signed)
  Pre-operative Diagnosis: Lymphadenopathy       Post-operative Diagnosis: Lymphadenopathy   Indications: Suspect lymphoma but previous biopsy was necrotic, needs more tissue  Procedure: US guided core biopsy of left axillary lymph node Findings: Two enlarged left axillary nodes.  One large left supraclavicular node.  Left axillary node was sampled because safer for large core biopsies.  7 cores obtained.   Complications: none     EBL: minimal  Plan: Bedrest 1 hour.

## 2016-12-04 ENCOUNTER — Encounter: Payer: Self-pay | Admitting: Hematology

## 2016-12-05 ENCOUNTER — Telehealth: Payer: Self-pay

## 2016-12-05 ENCOUNTER — Encounter: Payer: Self-pay | Admitting: Hematology

## 2016-12-05 NOTE — Telephone Encounter (Signed)
Pt called stating she has left 2 mychart messages and this phone call. She had repeat needle biopsy last week and has not gotten the results. She has no follow up appt.

## 2016-12-06 ENCOUNTER — Telehealth: Payer: Self-pay | Admitting: Hematology

## 2016-12-09 LAB — TISSUE HYBRIDIZATION TO NCBH

## 2016-12-10 ENCOUNTER — Telehealth: Payer: Self-pay | Admitting: *Deleted

## 2016-12-10 NOTE — Telephone Encounter (Signed)
Called patient to inform her of chemotherapy details: length of chemotherapy, how often, medication included in R-[facility]. Message sent to MD [facility] to place port orders/chemotherapy regimen.

## 2016-12-11 ENCOUNTER — Other Ambulatory Visit: Payer: Self-pay | Admitting: Hematology

## 2016-12-11 ENCOUNTER — Encounter: Payer: Self-pay | Admitting: Hematology

## 2016-12-11 DIAGNOSIS — C8248 Follicular lymphoma grade IIIb, lymph nodes of multiple sites: Secondary | ICD-10-CM | POA: Insufficient documentation

## 2016-12-11 NOTE — Progress Notes (Signed)
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  Plan -discussed treatment with R-CHOP (will plan to start on 3/29) -treatment plan built and signed -chemo-counseling for R-CHOP -Port-a-cath placement by IR -ordered. -has clinic f/u on 3/29.  Brunetta Genera  MD MS

## 2016-12-11 NOTE — Progress Notes (Signed)
START ON PATHWAY REGIMEN - Lymphoma and CLL     A cycle is every 21 days:     Rituximab      Cyclophosphamide      Doxorubicin      Vincristine      Prednisone   **Always confirm dose/schedule in your pharmacy ordering system**    Patient Characteristics: Follicular, Grade 3B, First Line, Stage III / IV Disease Type: Follicular Disease Type: Not Applicable [place] Stage: III Tumor Grade: 3B Line of therapy: First Line  Intent of Therapy: Curative Intent, Discussed with Patient

## 2016-12-12 ENCOUNTER — Encounter: Payer: Self-pay | Admitting: Hematology

## 2016-12-13 ENCOUNTER — Encounter (HOSPITAL_COMMUNITY): Payer: Self-pay

## 2016-12-17 ENCOUNTER — Encounter: Payer: Self-pay | Admitting: Hematology

## 2016-12-17 ENCOUNTER — Other Ambulatory Visit: Payer: Self-pay | Admitting: Radiology

## 2016-12-17 ENCOUNTER — Telehealth: Payer: Self-pay | Admitting: Hematology

## 2016-12-17 NOTE — Telephone Encounter (Signed)
Spoke with patient re ched/fu 3/29 and treatment 4/3. Patient will get new schedule 3/29. Being that treatment could not be accommodated 3/29 and port placement not until 3/30 per desk nurse treatment can be started next week.

## 2016-12-18 ENCOUNTER — Encounter: Payer: Self-pay | Admitting: *Deleted

## 2016-12-18 ENCOUNTER — Telehealth: Payer: Self-pay | Admitting: Hematology

## 2016-12-18 NOTE — Telephone Encounter (Signed)
Called patient to inform her of next scheduled appointments.  

## 2016-12-19 ENCOUNTER — Telehealth: Payer: Self-pay | Admitting: Hematology

## 2016-12-19 ENCOUNTER — Encounter: Payer: Self-pay | Admitting: *Deleted

## 2016-12-19 ENCOUNTER — Other Ambulatory Visit: Payer: Self-pay | Admitting: Radiology

## 2016-12-19 ENCOUNTER — Ambulatory Visit (HOSPITAL_BASED_OUTPATIENT_CLINIC_OR_DEPARTMENT_OTHER): Payer: BLUE CROSS/BLUE SHIELD | Admitting: Hematology

## 2016-12-19 ENCOUNTER — Other Ambulatory Visit: Payer: BLUE CROSS/BLUE SHIELD

## 2016-12-19 ENCOUNTER — Encounter: Payer: Self-pay | Admitting: Hematology

## 2016-12-19 ENCOUNTER — Other Ambulatory Visit: Payer: Self-pay | Admitting: Student

## 2016-12-19 VITALS — BP 136/77 | HR 76 | Temp 99.2°F | Resp 18 | Ht 62.0 in | Wt 196.8 lb

## 2016-12-19 DIAGNOSIS — C8248 Follicular lymphoma grade IIIb, lymph nodes of multiple sites: Secondary | ICD-10-CM

## 2016-12-19 MED ORDER — LIDOCAINE-PRILOCAINE 2.5-2.5 % EX CREA: TOPICAL_CREAM | CUTANEOUS | 3 refills | Status: DC

## 2016-12-19 MED ORDER — ALLOPURINOL 300 MG PO TABS: 300.0000 mg | ORAL_TABLET | Freq: Every day | ORAL | 3 refills | Status: DC

## 2016-12-19 MED ORDER — PREDNISONE 20 MG PO TABS: 60.0000 mg | ORAL_TABLET | Freq: Every day | ORAL | 6 refills | Status: DC

## 2016-12-19 MED ORDER — PROCHLORPERAZINE MALEATE 10 MG PO TABS: 10.0000 mg | ORAL_TABLET | Freq: Four times a day (QID) | ORAL | 6 refills | Status: DC | PRN

## 2016-12-19 MED ORDER — ONDANSETRON HCL 8 MG PO TABS: 8.0000 mg | ORAL_TABLET | Freq: Two times a day (BID) | ORAL | 1 refills | Status: DC | PRN

## 2016-12-19 NOTE — Patient Instructions (Signed)
Thank you for choosing [facility] to provide your oncology and hematology care.  To afford each patient quality time with our providers, please arrive 30 minutes before your scheduled appointment time.  If you arrive late for your appointment, you may be asked to reschedule.  We strive to give you quality time with our providers, and arriving late affects you and other patients whose appointments are after yours.  If you are a no show for multiple scheduled visits, you may be dismissed from the clinic at the providers discretion.   Again, thank you for choosing [facility], our hope is that these requests will decrease the amount of time that you wait before being seen by our physicians.  ______________________________________________________________________ Should you have questions after your visit to the  Cancer Center, please contact our office at [phone] between the hours of 8:30 and 4:30 p.m.    Voicemails left after 4:30p.m will not be returned until the following business day.   For prescription refill requests, please have your pharmacy contact us directly.  Please also try to allow 48 hours for prescription requests.   Please contact the scheduling department for questions regarding scheduling.  For scheduling of procedures such as PET scans, CT scans, MRI, Ultrasound, etc please contact central scheduling at [phone].   Resources For Cancer Patients and Caregivers:  American Cancer Society:  800-227-2345  Can help patients locate various types of support and financial assistance Cancer Care: 1-800-813-HOPE (4673) Provides financial assistance, online support groups, medication/co-pay assistance.   [place] DSS:  336-641-3447 Where to apply for food stamps, Medicaid, and utility assistance Medicare Rights Center: 800-333-4114 Helps people with Medicare understand their rights and benefits, navigate the Medicare system, and secure the  quality healthcare they deserve SCAT: 336-333-6589 [place] [employer]'s shared-ride transportation service for eligible riders who have a disability that prevents them from riding the fixed route bus.   For additional information on assistance programs please contact our social worker:   Grier Hock/Abigail Elmore:  [phone] 

## 2016-12-19 NOTE — Telephone Encounter (Signed)
Gave patient AVS and scheduled appt per 12/19/2016. Per Dr. Irene Limbo okay to schedule 12:30 MD appt on 4/12

## 2016-12-20 ENCOUNTER — Encounter (HOSPITAL_COMMUNITY): Payer: Self-pay

## 2016-12-20 ENCOUNTER — Ambulatory Visit (HOSPITAL_COMMUNITY)
Admission: RE | Admit: 2016-12-20 | Discharge: 2016-12-20 | Disposition: A | Discharge status: Home / Self Care | Payer: BLUE CROSS/BLUE SHIELD | Source: Ambulatory Visit | Attending: Hematology | Admitting: Hematology

## 2016-12-20 ENCOUNTER — Other Ambulatory Visit: Payer: Self-pay | Admitting: Hematology

## 2016-12-20 DIAGNOSIS — K5900 Constipation, unspecified: Secondary | ICD-10-CM | POA: Diagnosis not present

## 2016-12-20 DIAGNOSIS — C8290 Follicular lymphoma, unspecified, unspecified site: Secondary | ICD-10-CM | POA: Diagnosis present

## 2016-12-20 DIAGNOSIS — C8248 Follicular lymphoma grade IIIb, lymph nodes of multiple sites: Secondary | ICD-10-CM

## 2016-12-20 DIAGNOSIS — G43909 Migraine, unspecified, not intractable, without status migrainosus: Secondary | ICD-10-CM | POA: Diagnosis not present

## 2016-12-20 DIAGNOSIS — Z7952 Long term (current) use of systemic steroids: Secondary | ICD-10-CM | POA: Insufficient documentation

## 2016-12-20 HISTORY — PX: IR GENERIC HISTORICAL: IMG1180011

## 2016-12-20 LAB — CBC WITH DIFFERENTIAL/PLATELET
BASOS ABS: 0 10*3/uL (ref 0.0–0.1)
Basophils Relative: 1 %
EOS PCT: 4 %
Eosinophils Absolute: 0.2 10*3/uL (ref 0.0–0.7)
HCT: 35.8 % — ABNORMAL LOW (ref 36.0–46.0)
Hemoglobin: 12.1 g/dL (ref 12.0–15.0)
LYMPHS PCT: 28 %
Lymphs Abs: 1.4 10*3/uL (ref 0.7–4.0)
MCH: 29.1 pg (ref 26.0–34.0)
MCHC: 33.8 g/dL (ref 30.0–36.0)
MCV: 86.1 fL (ref 78.0–100.0)
Monocytes Absolute: 0.3 10*3/uL (ref 0.1–1.0)
Monocytes Relative: 6 %
NEUTROS PCT: 61 %
Neutro Abs: 3.1 10*3/uL (ref 1.7–7.7)
PLATELETS: 227 10*3/uL (ref 150–400)
RBC: 4.16 MIL/uL (ref 3.87–5.11)
RDW: 13.1 % (ref 11.5–15.5)
WBC: 5 10*3/uL (ref 4.0–10.5)

## 2016-12-20 LAB — PROTIME-INR
INR: 1.01
PROTHROMBIN TIME: 13.3 s (ref 11.4–15.2)

## 2016-12-20 MED ORDER — LIDOCAINE-EPINEPHRINE (PF) 2 %-1:200000 IJ SOLN
INTRAMUSCULAR | Status: AC
  Filled 2016-12-20: qty 20

## 2016-12-20 MED ORDER — CEFAZOLIN SODIUM-DEXTROSE 2-4 GM/100ML-% IV SOLN
2.0000 g | INTRAVENOUS | Status: AC
  Administered 2016-12-20: 2 g via INTRAVENOUS

## 2016-12-20 MED ORDER — SODIUM CHLORIDE 0.9 % IV SOLN
INTRAVENOUS | Status: DC
  Administered 2016-12-20: 12:00:00 via INTRAVENOUS

## 2016-12-20 MED ORDER — HEPARIN SOD (PORK) LOCK FLUSH 100 UNIT/ML IV SOLN
INTRAVENOUS | Status: AC | PRN
  Administered 2016-12-20: 500 [IU]

## 2016-12-20 MED ORDER — HEPARIN SOD (PORK) LOCK FLUSH 100 UNIT/ML IV SOLN
INTRAVENOUS | Status: AC
  Filled 2016-12-20: qty 5

## 2016-12-20 MED ORDER — MIDAZOLAM HCL 2 MG/2ML IJ SOLN
INTRAMUSCULAR | Status: AC
  Filled 2016-12-20: qty 6

## 2016-12-20 MED ORDER — CEFAZOLIN SODIUM-DEXTROSE 2-4 GM/100ML-% IV SOLN
INTRAVENOUS | Status: AC
  Administered 2016-12-20: 2 g via INTRAVENOUS
  Filled 2016-12-20: qty 100

## 2016-12-20 MED ORDER — LIDOCAINE HCL 1 % IJ SOLN
INTRAMUSCULAR | Status: AC
  Filled 2016-12-20: qty 20

## 2016-12-20 MED ORDER — FENTANYL CITRATE (PF) 100 MCG/2ML IJ SOLN
INTRAMUSCULAR | Status: AC | PRN
  Administered 2016-12-20: 25 ug via INTRAVENOUS
  Administered 2016-12-20: 50 ug via INTRAVENOUS
  Administered 2016-12-20: 25 ug via INTRAVENOUS

## 2016-12-20 MED ORDER — MIDAZOLAM HCL 2 MG/2ML IJ SOLN
INTRAMUSCULAR | Status: AC | PRN
  Administered 2016-12-20 (×6): 1 mg via INTRAVENOUS

## 2016-12-20 MED ORDER — FENTANYL CITRATE (PF) 100 MCG/2ML IJ SOLN
INTRAMUSCULAR | Status: AC
  Filled 2016-12-20: qty 4

## 2016-12-20 NOTE — Discharge Instructions (Signed)
Implanted [facility] Guide An implanted port is a type of central line that is placed under the skin. Central lines are used to provide IV access when treatment or nutrition needs to be given through a persons veins. Implanted ports are used for long-term IV access. An implanted port may be placed because:  You need IV medicine that would be irritating to the small veins in your hands or arms.  You need long-term IV medicines, such as antibiotics.  You need IV nutrition for a long period.  You need frequent blood draws for lab tests.  You need dialysis. Implanted ports are usually placed in the chest area, but they can also be placed in the upper arm, the abdomen, or the leg. An implanted port has two main parts:  Reservoir. The reservoir is round and will appear as a small, raised area under your skin. The reservoir is the part where a needle is inserted to give medicines or draw blood.  Catheter. The catheter is a thin, flexible tube that extends from the reservoir. The catheter is placed into a large vein. Medicine that is inserted into the reservoir goes into the catheter and then into the vein. How will I care for my incision site? Do not get the incision site wet. Bathe or shower as directed by your health care provider. How is my port accessed? Special steps must be taken to access the port:  Before the port is accessed, a numbing cream can be placed on the skin. This helps numb the skin over the port site.  Your health care provider uses a sterile technique to access the port.  Your health care provider must put on a mask and sterile gloves.  The skin over your port is cleaned carefully with an antiseptic and allowed to dry.  The port is gently pinched between sterile gloves, and a needle is inserted into the port.  Only "non-coring" port needles should be used to access the port. Once the port is accessed, a blood return should be checked. This helps ensure that the port is  in the vein and is not clogged.  If your port needs to remain accessed for a constant infusion, a clear (transparent) bandage will be placed over the needle site. The bandage and needle will need to be changed every week, or as directed by your health care provider.  Keep the bandage covering the needle clean and dry. Do not get it wet. Follow your health care providers instructions on how to take a shower or bath while the port is accessed.  If your port does not need to stay accessed, no bandage is needed over the port. What is flushing? Flushing helps keep the port from getting clogged. Follow your health care providers instructions on how and when to flush the port. Ports are usually flushed with saline solution or a medicine called heparin. The need for flushing will depend on how the port is used.  If the port is used for intermittent medicines or blood draws, the port will need to be flushed:  After medicines have been given.  After blood has been drawn.  As part of routine maintenance.  If a constant infusion is running, the port may not need to be flushed. How long will my port stay implanted? The port can stay in for as long as your health care provider thinks it is needed. When it is time for the port to come out, surgery will be done to remove it.  The procedure is similar to the one performed when the port was put in. When should I seek immediate medical care? When you have an implanted port, you should seek immediate medical care if:  You notice a bad smell coming from the incision site.  You have swelling, redness, or drainage at the incision site.  You have more swelling or pain at the port site or the surrounding area.  You have a fever that is not controlled with medicine. This information is not intended to replace advice given to you by your health care provider. Make sure you discuss any questions you have with your health care provider. Document Released:  09/09/2005 Document Revised: 02/15/2016 Document Reviewed: 05/17/2013 Elsevier Interactive Patient Education  2017 [place] Insertion, Care After This sheet gives you information about how to care for yourself after your procedure. Your health care provider may also give you more specific instructions. If you have problems or questions, contact your health care provider. What can I expect after the procedure? After your procedure, it is common to have:  Discomfort at the port insertion site.  Bruising on the skin over the port. This should improve over 3-4 days. Follow these instructions at home: [facility] care   After your port is placed, you will get a manufacturer's information card. The card has information about your port. Keep this card with you at all times.  Take care of the port as told by your health care provider. Ask your health care provider if you or a family member can get training for taking care of the port at home. A home health care nurse may also take care of the port.  Make sure to remember what type of port you have. Incision care   Follow instructions from your health care provider about how to take care of your port insertion site. Make sure you:  Wash your hands with soap and water before you change your bandage (dressing). If soap and water are not available, use hand sanitizer.  Change your dressing as told by your health care provider.  Leave stitches (sutures), skin glue, or adhesive strips in place. These skin closures may need to stay in place for 2 weeks or longer. If adhesive strip edges start to loosen and curl up, you may trim the loose edges. Do not remove adhesive strips completely unless your health care provider tells you to do that.  Check your port insertion site every day for signs of infection. Check for:  More redness, swelling, or pain.  More fluid or blood.  Warmth.  Pus or a bad smell. General instructions   Do not  take baths, swim, or use a hot tub until your health care provider approves.  Do not lift anything that is heavier than 10 lb (4.5 kg) for a week, or as told by your health care provider.  Ask your health care provider when it is okay to:  Return to work or school.  Resume usual physical activities or sports.  Do not drive for 24 hours if you were given a medicine to help you relax (sedative).  Take over-the-counter and prescription medicines only as told by your health care provider.  Wear a medical alert bracelet in case of an emergency. This will tell any health care providers that you have a port.  Keep all follow-up visits as told by your health care provider. This is important. Contact a health care provider if:  You cannot flush your port  with saline as directed, or you cannot draw blood from the port.  You have a fever or chills.  You have more redness, swelling, or pain around your port insertion site.  You have more fluid or blood coming from your port insertion site.  Your port insertion site feels warm to the touch.  You have pus or a bad smell coming from the port insertion site. Get help right away if:  You have chest pain or shortness of breath.  You have bleeding from your port that you cannot control. Summary  Take care of the port as told by your health care provider.  Change your dressing as told by your health care provider.  Keep all follow-up visits as told by your health care provider. This information is not intended to replace advice given to you by your health care provider. Make sure you discuss any questions you have with your health care provider. Document Released: 06/30/2013 Document Revised: 07/31/2016 Document Reviewed: 07/31/2016 Elsevier Interactive Patient Education  2017 [place]. Moderate Conscious Sedation, Adult, Care After These instructions provide you with information about caring for yourself after your procedure. Your  health care provider may also give you more specific instructions. Your treatment has been planned according to current medical practices, but problems sometimes occur. Call your health care provider if you have any problems or questions after your procedure. What can I expect after the procedure? After your procedure, it is common:  To feel sleepy for several hours.  To feel clumsy and have poor balance for several hours.  To have poor judgment for several hours.  To vomit if you eat too soon. Follow these instructions at home: For at least 24 hours after the procedure:    Do not:  Participate in activities where you could fall or become injured.  Drive.  Use heavy machinery.  Drink alcohol.  Take sleeping pills or medicines that cause drowsiness.  Make important decisions or sign legal documents.  Take care of children on your own.  Rest. Eating and drinking   Follow the diet recommended by your health care provider.  If you vomit:  Drink water, juice, or soup when you can drink without vomiting.  Make sure you have little or no nausea before eating solid foods. General instructions   Have a responsible adult stay with you until you are awake and alert.  Take over-the-counter and prescription medicines only as told by your health care provider.  If you smoke, do not smoke without supervision.  Keep all follow-up visits as told by your health care provider. This is important. Contact a health care provider if:  You keep feeling nauseous or you keep vomiting.  You feel light-headed.  You develop a rash.  You have a fever. Get help right away if:  You have trouble breathing. This information is not intended to replace advice given to you by your health care provider. Make sure you discuss any questions you have with your health care provider. Document Released: 06/30/2013 Document Revised: 02/12/2016 Document Reviewed: 12/30/2015 Elsevier Interactive  Patient Education  2017 [employer].

## 2016-12-20 NOTE — Procedures (Signed)
  Pre-operative Diagnosis: Follicular lymphoma       Post-operative Diagnosis: Follicular lymphoma   Indications: Needs port for treatment  Procedure: Portacath placement  Findings: Right IJ port, tip in lower SVC.  Complications: None     EBL: Minimal  Plan: Discharge home

## 2016-12-20 NOTE — H&P (Signed)
Referring Physician(s): Brunetta Genera  Supervising Physician: Markus Daft  Patient Status:  [facility]  Chief Complaint:  "I'm here to get a port a cath"  Subjective: Patient familiar to IR service from prior left retroperitoneal lymph node biopsy on 11/20/16 followed by left axillary lymph node biopsy on 11/27/16. She has a history of recently diagnosed follicular lymphoma and presents today for Port-A-Cath placement for chemotherapy. She currently denies fever, headache, chest pain, dyspnea, cough, abdominal pain, back pain, nausea, vomiting. She has had some intermittent constipation and some occasional hemorrhoidal bleeding.  Past Medical History:  Diagnosis Date  . Back pain   . Headache    migraines  . Swollen lymph nodes    left axilla, rectoperitonal    Past Surgical History:  Procedure Laterality Date  . ABDOMINAL HYSTERECTOMY  1980   total abdominal hysterectomy with tubes and ovaries  . BREAST SURGERY Left 1980's   breast biopsy  . CYST EXCISION Left 2017   left scapula  . HEMORRHOID SURGERY  1970's  . TUBAL LIGATION  1980's   before hysterectomy  . WRIST GANGLION EXCISION Left 1970's     Allergies: Sulfa antibiotics  Medications: Prior to Admission medications   Medication Sig Start Date End Date Taking? Authorizing Provider  HYDROcodone-acetaminophen (NORCO) 5-325 MG tablet Take 1-2 tablets by mouth every 6 (six) hours as needed for moderate pain or severe pain. 11/12/16  Yes Brunetta Genera, MD  senna-docusate (SENNA S) 8.6-50 MG tablet Take 2 tablets by mouth at bedtime as needed for mild constipation or moderate constipation. 11/12/16  Yes Brunetta Genera, MD  allopurinol (ZYLOPRIM) 300 MG tablet Take 1 tablet (300 mg total) by mouth daily. 12/19/16   Brunetta Genera, MD  lidocaine-prilocaine (EMLA) cream Apply to affected area once 12/19/16   Brunetta Genera, MD  ondansetron (ZOFRAN) 8 MG tablet Take 1 tablet (8 mg total) by mouth 2 (two)  times daily as needed for refractory nausea / vomiting. Start on day 3 after cyclophosphamide. 12/19/16   Brunetta Genera, MD  predniSONE (DELTASONE) 20 MG tablet Take 3 tablets (60 mg total) by mouth daily. Take on days 1-5 of chemotherapy. 12/19/16   Brunetta Genera, MD  prochlorperazine (COMPAZINE) 10 MG tablet Take 1 tablet (10 mg total) by mouth every 6 (six) hours as needed (Nausea or vomiting). 12/19/16   Brunetta Genera, MD     Vital Signs: BP (!) 141/86 (BP Location: Left Arm)   Pulse 74   Temp 98.1 F (36.7 C) (Oral)   Resp 18   SpO2 98%   Physical Exam awake, alert. chest with few fine right basilar crackles, left clear. Heart with regular rate and rhythm. Abdomen soft, positive bowel sounds, nontender.  Imaging: No results found.  Labs:  CBC:  Recent Labs  11/12/16 1033 11/20/16 1104 11/27/16 1021 12/20/16 1141  WBC 5.5 6.5 5.7 5.0  HGB 11.8 11.7* 12.0 12.1  HCT 35.5 34.6* 35.9* 35.8*  PLT 193 275 249 227    COAGS:  Recent Labs  11/12/16 1033 11/20/16 1104 12/20/16 1141  INR 1.00* 1.02 1.01  APTT  --  30  --     BMP:  Recent Labs  11/12/16 1033  NA 140  K 4.2  CO2 24  GLUCOSE 107  BUN 11.9  CALCIUM 9.6  CREATININE 0.8    LIVER FUNCTION TESTS:  Recent Labs  11/12/16 1033  BILITOT 0.49  AST 22  ALT 16  ALKPHOS  95  PROT 8.3  ALBUMIN 4.1    Assessment and Plan:  Pt with history of recently diagnosed follicular lymphoma ; she presents today for Port-A-Cath placement for chemotherapy.Risks and benefits discussed with the patient/brother including, but not limited to bleeding, infection, pneumothorax, or fibrin sheath development and need for additional procedures.All of the patient's questions were answered, patient is agreeable to proceed.Consent signed and in chart.     Electronically Signed: D. Rowe Robert 12/20/2016, 12:09 PM   I spent a total of 20 minutes at the the patient's bedside AND on the patient's hospital  floor or unit, greater than 50% of which was counseling/coordinating care for port a cath placement

## 2016-12-23 ENCOUNTER — Encounter: Payer: Self-pay | Admitting: Hematology

## 2016-12-24 ENCOUNTER — Ambulatory Visit: Payer: BLUE CROSS/BLUE SHIELD | Admitting: Nutrition

## 2016-12-24 ENCOUNTER — Ambulatory Visit (HOSPITAL_BASED_OUTPATIENT_CLINIC_OR_DEPARTMENT_OTHER): Payer: BLUE CROSS/BLUE SHIELD

## 2016-12-24 VITALS — BP 127/75 | HR 98 | Temp 98.9°F | Resp 19

## 2016-12-24 DIAGNOSIS — C8248 Follicular lymphoma grade IIIb, lymph nodes of multiple sites: Secondary | ICD-10-CM

## 2016-12-24 DIAGNOSIS — Z5112 Encounter for antineoplastic immunotherapy: Secondary | ICD-10-CM

## 2016-12-24 DIAGNOSIS — Z5111 Encounter for antineoplastic chemotherapy: Secondary | ICD-10-CM | POA: Diagnosis not present

## 2016-12-24 LAB — COMPREHENSIVE METABOLIC PANEL
ALT: 13 U/L (ref 0–55)
AST: 21 U/L (ref 5–34)
Albumin: 4 g/dL (ref 3.5–5.0)
Alkaline Phosphatase: 77 U/L (ref 40–150)
Anion Gap: 10 mEq/L (ref 3–11)
BUN: 14.2 mg/dL (ref 7.0–26.0)
CALCIUM: 9.5 mg/dL (ref 8.4–10.4)
CHLORIDE: 107 meq/L (ref 98–109)
CO2: 23 meq/L (ref 22–29)
CREATININE: 0.7 mg/dL (ref 0.6–1.1)
EGFR: 85 mL/min/{1.73_m2} — ABNORMAL LOW (ref >90)
GLUCOSE: 115 mg/dL (ref 70–140)
POTASSIUM: 4.1 meq/L (ref 3.5–5.1)
SODIUM: 140 meq/L (ref 136–145)
Total Bilirubin: 0.35 mg/dL (ref 0.20–1.20)
Total Protein: 7.7 g/dL (ref 6.4–8.3)

## 2016-12-24 MED ORDER — HEPARIN SOD (PORK) LOCK FLUSH 100 UNIT/ML IV SOLN
500.0000 [IU] | Freq: Once | INTRAVENOUS | Status: AC | PRN
Start: 2016-12-24 — End: 2016-12-24
  Administered 2016-12-24: 500 [IU]
  Filled 2016-12-24: qty 5

## 2016-12-24 MED ORDER — DEXAMETHASONE SODIUM PHOSPHATE 10 MG/ML IJ SOLN
10.0000 mg | Freq: Once | INTRAMUSCULAR | Status: AC
  Administered 2016-12-24: 10 mg via INTRAVENOUS

## 2016-12-24 MED ORDER — ACETAMINOPHEN 325 MG PO TABS
ORAL_TABLET | ORAL | Status: AC
  Filled 2016-12-24: qty 2

## 2016-12-24 MED ORDER — DOXORUBICIN HCL CHEMO IV INJECTION 2 MG/ML
50.0000 mg/m2 | Freq: Once | INTRAVENOUS | Status: AC
  Administered 2016-12-24: 98 mg via INTRAVENOUS
  Filled 2016-12-24: qty 49

## 2016-12-24 MED ORDER — ACETAMINOPHEN 325 MG PO TABS
650.0000 mg | ORAL_TABLET | Freq: Once | ORAL | Status: AC
  Administered 2016-12-24: 650 mg via ORAL

## 2016-12-24 MED ORDER — DIPHENHYDRAMINE HCL 25 MG PO CAPS
ORAL_CAPSULE | ORAL | Status: AC
  Filled 2016-12-24: qty 2

## 2016-12-24 MED ORDER — DEXAMETHASONE SODIUM PHOSPHATE 10 MG/ML IJ SOLN
INTRAMUSCULAR | Status: AC
  Filled 2016-12-24: qty 1

## 2016-12-24 MED ORDER — SODIUM CHLORIDE 0.9% FLUSH
10.0000 mL | INTRAVENOUS | Status: DC | PRN
  Administered 2016-12-24: 10 mL
  Filled 2016-12-24: qty 10

## 2016-12-24 MED ORDER — SODIUM CHLORIDE 0.9 % IV SOLN
10.0000 mg | Freq: Once | INTRAVENOUS | Status: DC
  Filled 2016-12-24: qty 1

## 2016-12-24 MED ORDER — SODIUM CHLORIDE 0.9 % IV SOLN
375.0000 mg/m2 | Freq: Once | INTRAVENOUS | Status: AC
  Administered 2016-12-24: 700 mg via INTRAVENOUS
  Filled 2016-12-24: qty 50

## 2016-12-24 MED ORDER — DIPHENHYDRAMINE HCL 25 MG PO CAPS
50.0000 mg | ORAL_CAPSULE | Freq: Once | ORAL | Status: AC
  Administered 2016-12-24: 50 mg via ORAL

## 2016-12-24 MED ORDER — CYCLOPHOSPHAMIDE CHEMO INJECTION 1 GM
750.0000 mg/m2 | Freq: Once | INTRAMUSCULAR | Status: AC
  Administered 2016-12-24: 1480 mg via INTRAVENOUS
  Filled 2016-12-24: qty 74

## 2016-12-24 MED ORDER — PALONOSETRON HCL INJECTION 0.25 MG/5ML
INTRAVENOUS | Status: AC
  Filled 2016-12-24: qty 5

## 2016-12-24 MED ORDER — VINCRISTINE SULFATE CHEMO INJECTION 1 MG/ML
2.0000 mg | Freq: Once | INTRAVENOUS | Status: AC
  Administered 2016-12-24: 2 mg via INTRAVENOUS
  Filled 2016-12-24: qty 2

## 2016-12-24 MED ORDER — PALONOSETRON HCL INJECTION 0.25 MG/5ML
0.2500 mg | Freq: Once | INTRAVENOUS | Status: AC
  Administered 2016-12-24: 0.25 mg via INTRAVENOUS

## 2016-12-24 MED ORDER — SODIUM CHLORIDE 0.9 % IV SOLN
Freq: Once | INTRAVENOUS | Status: AC
  Administered 2016-12-24: 12:00:00 via INTRAVENOUS

## 2016-12-24 NOTE — Progress Notes (Signed)
Nutrition follow-up with patient receiving chemotherapy for follicular lymphoma.  Weight is stable and documented as 196.8 pounds. Patient states she drinks 2 Ensure Plus daily. Last bowel movement was Friday, 4 days ago. No other nutrition impact symptoms.  Nutrition diagnosis: Unintended weight loss improved.  Intervention: Recommended patient continue adequate calories and protein to maintain weight. Encouraged patient to continue oral nutrition supplements. Reviewed bowel regimen. Teach back method was used.  Monitoring, evaluation, goals: Patient will continue to tolerate adequate calories and protein for weight stabilization and healing.  Next visit: Patient will contact me for questions or concerns.  **Disclaimer: This note was dictated with voice recognition software. Similar sounding words can inadvertently be transcribed and this note may contain transcription errors which may not have been corrected upon publication of note.**

## 2016-12-25 ENCOUNTER — Encounter: Payer: Self-pay | Admitting: Hematology

## 2016-12-26 ENCOUNTER — Ambulatory Visit (HOSPITAL_BASED_OUTPATIENT_CLINIC_OR_DEPARTMENT_OTHER): Payer: BLUE CROSS/BLUE SHIELD

## 2016-12-26 VITALS — BP 133/61 | HR 75 | Resp 18

## 2016-12-26 DIAGNOSIS — C8248 Follicular lymphoma grade IIIb, lymph nodes of multiple sites: Secondary | ICD-10-CM

## 2016-12-26 MED ORDER — PEGFILGRASTIM INJECTION 6 MG/0.6ML ~~LOC~~
6.0000 mg | PREFILLED_SYRINGE | Freq: Once | SUBCUTANEOUS | Status: AC
  Administered 2016-12-26: 6 mg via SUBCUTANEOUS
  Filled 2016-12-26: qty 0.6

## 2016-12-26 NOTE — Progress Notes (Signed)
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 female who has been referred to [place] by Dr .Asencion Noble, MD for evaluation and management of suspected non-Hodgkin's lymphoma.  Patient notes that she presented with back pain for 4-6 weeks and eventually had pain radiating to her right inguinal area. She notes that she has had interstitial cystitis in the past. She was thought to have a urinary tract infection and was treated with Macrodantin. Says the pain got worse and started radiating into the right groin she had a CT of the abdomen renal stone protocol on 10/25/2016 which showed extensive retroperitoneal lymphadenopathy concerning for lymphoproliferative disorder. She was incidentally noted to have a nodular border of the liver with possible liver cirrhosis. No urinary stones noted.  She subsequently had a PET CT scan ordered by her primary care physician which was done on 11/07/2016. This showed diffuse hypermetabolic spleen with mild splenomegaly. Hypermetabolic adenopathy in the left supraclavicular, bilateral axillary, peripancreatic, gastrohepatic ligament, celiac, porta hepatis, periaortic, and left external iliac chains. Hypermetabolic mesenteric lymph nodes. Appearance favors lymphoma.  Patient notes some night sweats for 2 weeks. Has lost about 10 pounds in the last month. No acute fevers or chills. Notes the back pain is controlled with when necessary Vicodin.  INTERVAL HISTORY  Katelyn Reeves is here for her scheduled followup for her newly diagnosed high grade follicular lymphoma. She is here to start her 1st cylce of R-CHOP ctx. Port placed. ECHo with nl EF. Chemo-counseling completed. Patient had several additional questions  which were answered in details to her apparent satisfaction.   MEDICAL HISTORY:  #1 fibrocystic disease of the breast #2 hypertension #3 constipation #4 duplicated right sided ureters #5 imaging concern for possible liver cirrhosis  SURGICAL HISTORY:  #1 total abdominal hysterectomy with bilateral salpingo-oophorectomy for ovarian cyst. #2 Lipoma removal left upper back #3 hemorrhoidectomy  SOCIAL HISTORY: Social History   Social History  . Marital status: Divorced    Spouse name: N/A  . Number of children: N/A  . Years of education: N/A   Occupational History  . Not on file.   Social History Main Topics  . Smoking status: Never Smoker  . Smokeless tobacco: Never Used  . Alcohol use No  . Drug use: No  . Sexual activity: Not on file   Other Topics Concern  . Not on file   Social History Narrative  . No narrative on file  Nonsmoker no issues with alcohol use or drug use. 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   FAMILY HISTORY:  Notes her mother had ovarian cancer followed by leukemia and died at age 15 years Maternal aunt gastric cancer  ALLERGIES:  is allergic to sulfa antibiotics.  MEDICATIONS:  Current Outpatient Prescriptions  Medication Sig Dispense Refill  . HYDROcodone-acetaminophen (NORCO) 5-325 MG tablet Take 1-2 tablets by mouth every 6 (six) hours as needed for moderate pain or severe pain. 30 tablet 0  . senna-docusate (SENNA S) 8.6-50 MG tablet Take 2 tablets by mouth at bedtime as needed for mild constipation or moderate constipation. 60 tablet 1  . allopurinol (ZYLOPRIM) 300 MG tablet Take 1 tablet (300 mg total) by mouth daily. 30 tablet 3  .  lidocaine-prilocaine (EMLA) cream Apply to affected area once 30 g 3  . ondansetron (ZOFRAN) 8 MG tablet Take 1 tablet (8 mg total) by mouth 2 (two) times daily as needed for refractory nausea / vomiting. Start on day 3 after cyclophosphamide. 30 tablet 1  .  predniSONE (DELTASONE) 20 MG tablet Take 3 tablets (60 mg total) by mouth daily. Take on days 1-5 of chemotherapy. 15 tablet 6  . prochlorperazine (COMPAZINE) 10 MG tablet Take 1 tablet (10 mg total) by mouth every 6 (six) hours as needed (Nausea or vomiting). 30 tablet 6   No current facility-administered medications for this visit.     REVIEW OF SYSTEMS:    10 Point review of Systems was done is negative except as noted above.  PHYSICAL EXAMINATION: ECOG PERFORMANCE STATUS: 1 - Symptomatic but completely ambulatory  . Vitals:   12/19/16 1236  BP: 136/77  Pulse: 76  Resp: 18  Temp: 99.2 F (37.3 C)   Filed Weights   12/19/16 1236  Weight: 196 lb 12.8 oz (89.3 kg)   .Body mass index is 36 kg/m.  GENERAL:alert, in no acute distress and comfortable SKIN: skin color, texture, turgor are normal, no rashes or significant lesions EYES: normal, conjunctiva are pink and non-injected, sclera clear OROPHARYNX:no exudate, no erythema and lips, buccal mucosa, and tongue normal  NECK: supple, no JVD, thyroid normal size, non-tender, without nodularity LYMPH: palpable left supraclavicular and left axillary lymph node. Smaller right axillary lymph node. LUNGS: clear to auscultation with normal respiratory effort HEART: regular rate & rhythm,  no murmurs and no lower extremity edema ABDOMEN: abdomen soft, non-tender, normoactive bowel sounds , mild tenderness to palpation in left upper abdomen. Musculoskeletal: no cyanosis of digits and no clubbing  PSYCH: alert & oriented x 3 with fluent speech NEURO: no focal motor/sensory deficits  LABORATORY DATA:  I have reviewed the data as listed  . CBC Latest Ref Rng & Units 12/20/2016 11/27/2016 11/20/2016  WBC 4.0 - 10.5 K/uL 5.0 5.7 6.5  Hemoglobin 12.0 - 15.0 g/dL 12.1 12.0 11.7(L)  Hematocrit 36.0 - 46.0 % 35.8(L) 35.9(L) 34.6(L)  Platelets 150 - 400 K/uL 227 249 275   CBC    Component Value Date/Time   WBC 5.0 12/20/2016 1141   RBC  4.16 12/20/2016 1141   HGB 12.1 12/20/2016 1141   HGB 11.8 11/12/2016 1033   HCT 35.8 (L) 12/20/2016 1141   HCT 35.5 11/12/2016 1033   PLT 227 12/20/2016 1141   PLT 193 11/12/2016 1033   MCV 86.1 12/20/2016 1141   MCV 87.7 11/12/2016 1033   MCH 29.1 12/20/2016 1141   MCHC 33.8 12/20/2016 1141   RDW 13.1 12/20/2016 1141   RDW 12.8 11/12/2016 1033   LYMPHSABS 1.4 12/20/2016 1141   LYMPHSABS 1.3 11/12/2016 1033   MONOABS 0.3 12/20/2016 1141   MONOABS 0.3 11/12/2016 1033   EOSABS 0.2 12/20/2016 1141   EOSABS 0.2 11/12/2016 1033   BASOSABS 0.0 12/20/2016 1141   BASOSABS 0.1 11/12/2016 1033   . CMP Latest Ref Rng & Units 12/24/2016 11/12/2016  Glucose 70 - 140 mg/dl 115 107  BUN 7.0 - 26.0 mg/dL 14.2 11.9  Creatinine 0.6 - 1.1 mg/dL 0.7 0.8  Sodium 136 - 145 mEq/L 140 140  Potassium 3.5 - 5.1 mEq/L 4.1 4.2  CO2 22 - 29 mEq/L 23 24  Calcium 8.4 - 10.4 mg/dL 9.5 9.6  Total Protein 6.4 - 8.3 g/dL 7.7 8.3  Total Bilirubin 0.20 - 1.20 mg/dL 0.35 0.49  Alkaline Phos 40 - 150 U/L 77 95  AST 5 - 34 U/L 21 22  ALT 0 - 55 U/L 13 16   . Lab Results  Component Value Date   LDH 210 11/12/2016   Component     Latest Ref Rng & Units 11/12/2016  Hep C Virus Ab     0.0 - 0.9 s/co ratio <0.1  Hepatitis B Surface Ag     Negative Negative  Hep B Core Ab, Tot     Negative Negative   Component     Latest Ref Rng & Units 11/12/2016  Protime     10.6 - 13.4 Seconds 12.0  INR     2.00 - 3.50 1.00 (L)  Lovenox      No  APTT     24 - 33 sec 27       *Allen*                  *[employer] [employer].                        [place],  [id number]                            [phone]  ------------------------------------------------------------------- Transthoracic Echocardiography  Patient:    Reighlyn, Elmes MR #:       [id number] Study Date: 11/19/2016 Gender:     F Age:        72 Height:     160 cm Weight:     90.2  kg BSA:        2.04 m^2 Pt. Status: Room:   SONOGRAPHER  Tresa Res, RDCS  PERFORMING   [facility], Outpatient  ATTENDING    Roselee Nova     [place], [place] Kishore  REFERRING    [place], [place] Kishore  cc:  ------------------------------------------------------------------- LV EF: 60% -   65%  ------------------------------------------------------------------- Indications:      V58.11 Chemotherapy Evaluation.  ------------------------------------------------------------------- History:   Risk factors:  Lymphoma. Obese.  ------------------------------------------------------------------- Study Conclusions  - Left ventricle: The cavity size was normal. There was mild   concentric hypertrophy. Systolic function was normal. The   estimated ejection fraction was in the range of 60% to 65%. Wall   motion was normal; there were no regional wall motion   abnormalities. Doppler parameters are consistent with abnormal   left ventricular relaxation (grade 1 diastolic dysfunction).   Doppler parameters are consistent with indeterminate ventricular   filling pressure. - Aortic valve: Transvalvular velocity was within the normal range.   There was no stenosis. There was trivial regurgitation. - Mitral valve: Transvalvular velocity was within the normal range.   There was no evidence for stenosis. There was trivial   regurgitation. - Right ventricle: The cavity size was normal. Wall thickness was   normal. Systolic function was normal. - Atrial septum: No defect or patent foramen ovale was identified   by color flow Doppler. - Tricuspid valve: There was no regurgitation. - Global longitudinal strain -17.6%   RADIOGRAPHIC STUDIES: I have personally reviewed the radiological images as listed and agreed with the findings in the report. US Biopsy  Result Date: 11/27/2016 INDICATION: 66 year old with suspicious lymphadenopathy. Previous biopsy was inadequate  due to  necrotic tissue. Findings concerning for a lymphoproliferative process. Additional tissue sampling is needed. EXAM: ULTRASOUND-GUIDED CORE BIOPSY OF LEFT AXILLARY LYMPH NODE. MEDICATIONS: None. ANESTHESIA/SEDATION: Moderate (conscious) sedation was employed during this procedure. A total of Versed 2.0 mg and Fentanyl 100 mcg was administered intravenously. Moderate Sedation Time: 28 minutes. The patient's level of consciousness and vital signs were monitored continuously by radiology nursing throughout the procedure under my direct supervision. FLUOROSCOPY TIME:  None COMPLICATIONS: None immediate. PROCEDURE: The procedure was explained to the patient. The risks and benefits of the procedure were discussed and the patient's questions were addressed. Informed consent was obtained from the patient. The left axillary region and left supraclavicular area were evaluated with ultrasound. Enlarged lymph node in left supraclavicular area and left axillary region were identified. Left axillary lymph node was selected for biopsy. Left axilla was prepped and draped in sterile fashion. Skin was anesthetized with 1% lidocaine. Using ultrasound guidance, core biopsies were obtained with an 18 gauge device. A total of 7 core biopsies were obtained. Specimens were placed in saline. Bandage placed over the puncture site. FINDINGS: There are 2 prominent hypoechoic lymph nodes in left axillary region. This corresponds with the recent PET-CT imaging. There is a prominent left supraclavicular lymph node which corresponds with the recent PET-CT. The largest left axillary lymph was selected for biopsy because it was felt that core biopsies could be more safely obtained from this location. IMPRESSION: Ultrasound-guided core biopsies of a left axillary lymph node. Electronically Signed   By: Markus Daft M.D.   On: 11/27/2016 17:57   Ir US Guide Vasc Access Right  Result Date: 12/20/2016 INDICATION: 66 year old with grade 3 follicular  lymphoma of lymph nodes of multiple regions. EXAM: FLUOROSCOPIC AND ULTRASOUND GUIDED PLACEMENT OF A SUBCUTANEOUS PORT COMPARISON:  None. MEDICATIONS: Ancef 2 g; The antibiotic was administered within an appropriate time interval prior to skin puncture. ANESTHESIA/SEDATION: Versed 6.0 mg IV; Fentanyl 100 mcg IV; Moderate Sedation Time:  43 minutes The patient was continuously monitored during the procedure by the interventional radiology nurse under my direct supervision. FLUOROSCOPY TIME:  48 seconds, 8 mGy COMPLICATIONS: None immediate. PROCEDURE: The procedure, risks, benefits, and alternatives were explained to the patient. Questions regarding the procedure were encouraged and answered. The patient understands and consents to the procedure. Patient was placed supine on the interventional table. Ultrasound confirmed a patent right internal jugular vein. The right chest and neck were cleaned with a skin antiseptic and a sterile drape was placed. Maximal barrier sterile technique was utilized including caps, mask, sterile gowns, sterile gloves, sterile drape, hand hygiene and skin antiseptic. The right neck was anesthetized with 1% lidocaine. Small incision was made in the right neck with a blade. Micropuncture set was placed in the right internal jugular vein with ultrasound guidance. The micropuncture wire was used for measurement purposes. The right chest was anesthetized with 1% lidocaine with epinephrine. #15 blade was used to make an incision and a subcutaneous port pocket was formed. IXL was assembled. Subcutaneous tunnel was formed with a stiff tunneling device. The port catheter was brought through the subcutaneous tunnel. The port was placed in the subcutaneous pocket and sutured in place. The micropuncture set was exchanged for a peel-away sheath. The catheter was placed through the peel-away sheath and the tip was positioned in the lower SVC. Catheter placement was confirmed with  fluoroscopy. The port was accessed and flushed with heparinized saline. The port pocket was closed using  two layers of absorbable sutures and skin glue. The vein skin site was closed using a single layer of absorbable suture and skin glue. Sterile dressings were applied. Patient tolerated the procedure well without an immediate complication. Ultrasound and fluoroscopic images were taken and saved for this procedure. IMPRESSION: Placement of a subcutaneous port device. The catheter tip is in lower SVC and ready to be used. Electronically Signed   By: Markus Daft M.D.   On: 12/20/2016 17:37   Ir Fluoro Guide Port Insertion Right  Result Date: 12/20/2016 INDICATION: 66 year old with grade 3 follicular lymphoma of lymph nodes of multiple regions. EXAM: FLUOROSCOPIC AND ULTRASOUND GUIDED PLACEMENT OF A SUBCUTANEOUS PORT COMPARISON:  None. MEDICATIONS: Ancef 2 g; The antibiotic was administered within an appropriate time interval prior to skin puncture. ANESTHESIA/SEDATION: Versed 6.0 mg IV; Fentanyl 100 mcg IV; Moderate Sedation Time:  43 minutes The patient was continuously monitored during the procedure by the interventional radiology nurse under my direct supervision. FLUOROSCOPY TIME:  48 seconds, 8 mGy COMPLICATIONS: None immediate. PROCEDURE: The procedure, risks, benefits, and alternatives were explained to the patient. Questions regarding the procedure were encouraged and answered. The patient understands and consents to the procedure. Patient was placed supine on the interventional table. Ultrasound confirmed a patent right internal jugular vein. The right chest and neck were cleaned with a skin antiseptic and a sterile drape was placed. Maximal barrier sterile technique was utilized including caps, mask, sterile gowns, sterile gloves, sterile drape, hand hygiene and skin antiseptic. The right neck was anesthetized with 1% lidocaine. Small incision was made in the right neck with a blade. Micropuncture set  was placed in the right internal jugular vein with ultrasound guidance. The micropuncture wire was used for measurement purposes. The right chest was anesthetized with 1% lidocaine with epinephrine. #15 blade was used to make an incision and a subcutaneous port pocket was formed. [place] was assembled. Subcutaneous tunnel was formed with a stiff tunneling device. The port catheter was brought through the subcutaneous tunnel. The port was placed in the subcutaneous pocket and sutured in place. The micropuncture set was exchanged for a peel-away sheath. The catheter was placed through the peel-away sheath and the tip was positioned in the lower SVC. Catheter placement was confirmed with fluoroscopy. The port was accessed and flushed with heparinized saline. The port pocket was closed using two layers of absorbable sutures and skin glue. The vein skin site was closed using a single layer of absorbable suture and skin glue. Sterile dressings were applied. Patient tolerated the procedure well without an immediate complication. Ultrasound and fluoroscopic images were taken and saved for this procedure. IMPRESSION: Placement of a subcutaneous port device. The catheter tip is in lower SVC and ready to be used. Electronically Signed   By: Markus Daft M.D.   On: 12/20/2016 17:37    ASSESSMENT & PLAN:   66 yo caucasian female with is actively working as a Forensic scientist with   1) Newly diagnosed High grade follicullar lymphoma (Likely Grade 3b per Dr Monica Martinez)  Presented with Generalized FDG avid lymphadenopathy - predominantly in the abdomen/Retroperitoneum. Also noted to have left supraclavicular and bilateral axillary left more than right FDG avid lymphadenopathy. Based on imaging this would represent at least Stage III disease if this were a lymphoma. LDH level is not significantly elevated. Blood counts are stable. PET/CT scan does show fairly active disease which is FDG avid with FDG avid  splenomegaly. Patient has some constitutional  symptoms with about a 10 pound weight loss and some night sweats. Hepatitis profile negative. No other obvious new focal symptoms.  Patient has had an ECHo which shows nl EF Plan - the new diagnosis of high grade follicular lymphoma was discussed in details and all her questions were answered. I had previously discussed this with Katelyn Porto on the telephone as well. -pot-a-cath has been placed. -she has completed chemo-counseling for R-CHOP and has signed informed consent. Other questions she had were answered in details. -given age and higher risk of neutropenia will provide G-CSF support with each cycle of ctx. - appropriate premedications have been prescribed as per treatment plan. -she feels quite ready to proceed with treatment. -we will plan to rpt PET/CT after 3 cycles of rx and plan to treat with 6 cycles of R-CHOP if tolerated.   RTC with Dr Irene Limbo in 7-10 days with labs for toxicity check.   All of the patients questions were answered with apparent satisfaction. The patient knows to call the clinic with any problems, questions or concerns.  I spent 25 minutes counseling the patient face to face. The total time spent in the appointment was 30 minutes and more than 50% was on counseling and direct patient cares.    Sullivan Lone MD [place] AAHIVMS [facility] Hematology/Oncology Physician [facility]  (Office):       [phone] (Work cell):  [phone] (Fax):           [phone]

## 2016-12-31 ENCOUNTER — Encounter: Payer: Self-pay | Admitting: Hematology

## 2017-01-01 ENCOUNTER — Encounter: Payer: Self-pay | Admitting: Hematology

## 2017-01-01 ENCOUNTER — Ambulatory Visit (HOSPITAL_BASED_OUTPATIENT_CLINIC_OR_DEPARTMENT_OTHER): Payer: BLUE CROSS/BLUE SHIELD | Admitting: Hematology

## 2017-01-01 ENCOUNTER — Telehealth: Payer: Self-pay | Admitting: Hematology

## 2017-01-01 ENCOUNTER — Other Ambulatory Visit: Payer: Self-pay | Admitting: *Deleted

## 2017-01-01 ENCOUNTER — Ambulatory Visit: Payer: BLUE CROSS/BLUE SHIELD

## 2017-01-01 ENCOUNTER — Ambulatory Visit (HOSPITAL_BASED_OUTPATIENT_CLINIC_OR_DEPARTMENT_OTHER): Payer: BLUE CROSS/BLUE SHIELD

## 2017-01-01 ENCOUNTER — Ambulatory Visit (HOSPITAL_COMMUNITY)
Admission: RE | Admit: 2017-01-01 | Discharge: 2017-01-01 | Disposition: A | Discharge status: Home / Self Care | Payer: BLUE CROSS/BLUE SHIELD | Source: Ambulatory Visit | Attending: Hematology | Admitting: Hematology

## 2017-01-01 VITALS — BP 123/67 | HR 90 | Temp 98.3°F | Resp 18 | Ht 62.0 in

## 2017-01-01 DIAGNOSIS — R252 Cramp and spasm: Secondary | ICD-10-CM

## 2017-01-01 DIAGNOSIS — R634 Abnormal weight loss: Secondary | ICD-10-CM | POA: Diagnosis not present

## 2017-01-01 DIAGNOSIS — R61 Generalized hyperhidrosis: Secondary | ICD-10-CM

## 2017-01-01 DIAGNOSIS — M549 Dorsalgia, unspecified: Secondary | ICD-10-CM

## 2017-01-01 DIAGNOSIS — C8248 Follicular lymphoma grade IIIb, lymph nodes of multiple sites: Secondary | ICD-10-CM

## 2017-01-01 DIAGNOSIS — M545 Low back pain, unspecified: Secondary | ICD-10-CM

## 2017-01-01 DIAGNOSIS — E860 Dehydration: Secondary | ICD-10-CM

## 2017-01-01 LAB — COMPREHENSIVE METABOLIC PANEL
ALK PHOS: 88 U/L (ref 40–150)
ALT: 17 U/L (ref 0–55)
ANION GAP: 13 meq/L — AB (ref 3–11)
AST: 16 U/L (ref 5–34)
Albumin: 4.1 g/dL (ref 3.5–5.0)
BILIRUBIN TOTAL: 0.54 mg/dL (ref 0.20–1.20)
BUN: 12.8 mg/dL (ref 7.0–26.0)
CO2: 20 meq/L — AB (ref 22–29)
Calcium: 9.9 mg/dL (ref 8.4–10.4)
Chloride: 105 mEq/L (ref 98–109)
Creatinine: 0.7 mg/dL (ref 0.6–1.1)
EGFR: 88 mL/min/{1.73_m2} — AB (ref >90)
Glucose: 102 mg/dl (ref 70–140)
Potassium: 3.8 mEq/L (ref 3.5–5.1)
Sodium: 137 mEq/L (ref 136–145)
TOTAL PROTEIN: 7.7 g/dL (ref 6.4–8.3)

## 2017-01-01 LAB — CBC & DIFF AND RETIC
BASO%: 2.3 % — AB (ref 0.0–2.0)
Basophils Absolute: 0.1 10*3/uL (ref 0.0–0.1)
EOS%: 4.6 % (ref 0.0–7.0)
Eosinophils Absolute: 0.2 10*3/uL (ref 0.0–0.5)
HCT: 35 % (ref 34.8–46.6)
HGB: 12.1 g/dL (ref 11.6–15.9)
IMMATURE RETIC FRACT: 18.8 % — AB (ref 1.60–10.00)
LYMPH#: 1.5 10*3/uL (ref 0.9–3.3)
LYMPH%: 43.7 % (ref 14.0–49.7)
MCH: 29.2 pg (ref 25.1–34.0)
MCHC: 34.6 g/dL (ref 31.5–36.0)
MCV: 84.5 fL (ref 79.5–101.0)
MONO#: 0.5 10*3/uL (ref 0.1–0.9)
MONO%: 14.4 % — ABNORMAL HIGH (ref 0.0–14.0)
NEUT%: 35 % — ABNORMAL LOW (ref 38.4–76.8)
NEUTROS ABS: 1.2 10*3/uL — AB (ref 1.5–6.5)
PLATELETS: 170 10*3/uL (ref 145–400)
RBC: 4.14 10*6/uL (ref 3.70–5.45)
RDW: 13.1 % (ref 11.2–14.5)
RETIC CT ABS: 16.15 10*3/uL — AB (ref 33.70–90.70)
Retic %: 0.39 % — ABNORMAL LOW (ref 0.70–2.10)
WBC: 3.5 10*3/uL — AB (ref 3.9–10.3)

## 2017-01-01 LAB — MAGNESIUM: MAGNESIUM: 2.1 mg/dL (ref 1.5–2.5)

## 2017-01-01 LAB — URIC ACID: Uric Acid, Serum: 4.2 mg/dl (ref 2.6–7.4)

## 2017-01-01 MED ORDER — LORAZEPAM 2 MG/ML IJ SOLN
0.5000 mg | Freq: Once | INTRAMUSCULAR | Status: AC
  Administered 2017-01-01: 0.5 mg via INTRAVENOUS

## 2017-01-01 MED ORDER — METHYLPREDNISOLONE SODIUM SUCC 125 MG IJ SOLR: 125.0000 mg | Freq: Once | INTRAMUSCULAR | Status: DC

## 2017-01-01 MED ORDER — KETOROLAC TROMETHAMINE 30 MG/ML IJ SOLN
INTRAMUSCULAR | Status: AC
  Filled 2017-01-01: qty 1

## 2017-01-01 MED ORDER — KETOROLAC TROMETHAMINE 30 MG/ML IJ SOLN
30.0000 mg | Freq: Once | INTRAMUSCULAR | Status: AC
  Administered 2017-01-01: 30 mg via INTRAVENOUS

## 2017-01-01 MED ORDER — HYDROMORPHONE HCL 4 MG/ML IJ SOLN
INTRAMUSCULAR | Status: AC
  Filled 2017-01-01: qty 1

## 2017-01-01 MED ORDER — NAPROXEN 500 MG PO TABS: 500.0000 mg | ORAL_TABLET | Freq: Two times a day (BID) | ORAL | 0 refills | Status: DC

## 2017-01-01 MED ORDER — HYDROMORPHONE HCL 4 MG/ML IJ SOLN
1.0000 mg | Freq: Once | INTRAMUSCULAR | Status: AC
  Administered 2017-01-01: 1 mg via INTRAVENOUS

## 2017-01-01 MED ORDER — SODIUM CHLORIDE 0.9 % IV SOLN
Freq: Once | INTRAVENOUS | Status: AC
  Administered 2017-01-01: 14:00:00 via INTRAVENOUS

## 2017-01-01 MED ORDER — TIZANIDINE HCL 2 MG PO TABS: 4.0000 mg | ORAL_TABLET | Freq: Three times a day (TID) | ORAL | 0 refills | Status: DC | PRN

## 2017-01-01 MED ORDER — LORAZEPAM 2 MG/ML IJ SOLN
INTRAMUSCULAR | Status: AC
  Filled 2017-01-01: qty 1

## 2017-01-01 MED ORDER — METHYLPREDNISOLONE SODIUM SUCC 125 MG IJ SOLR
INTRAMUSCULAR | Status: AC
  Filled 2017-01-01: qty 2

## 2017-01-01 NOTE — Patient Instructions (Signed)
Thank you for choosing [facility] to provide your oncology and hematology care.  To afford each patient quality time with our providers, please arrive 30 minutes before your scheduled appointment time.  If you arrive late for your appointment, you may be asked to reschedule.  We strive to give you quality time with our providers, and arriving late affects you and other patients whose appointments are after yours.  If you are a no show for multiple scheduled visits, you may be dismissed from the clinic at the providers discretion.   Again, thank you for choosing [facility], our hope is that these requests will decrease the amount of time that you wait before being seen by our physicians.  ______________________________________________________________________ Should you have questions after your visit to the [facility], please contact our office at [phone] between the hours of 8:30 and 4:30 p.m.    Voicemails left after 4:30p.m will not be returned until the following business day.   For prescription refill requests, please have your pharmacy contact us directly.  Please also try to allow 48 hours for prescription requests.   Please contact the scheduling department for questions regarding scheduling.  For scheduling of procedures such as PET scans, CT scans, MRI, Ultrasound, etc please contact central scheduling at [phone].   Resources For Cancer Patients and Caregivers:  American Cancer Society:  800-227-2345  Can help patients locate various types of support and financial assistance Cancer Care: 1-800-813-HOPE (4673) Provides financial assistance, online support groups, medication/co-pay assistance.   [place] DSS:  336-641-3447 Where to apply for food stamps, Medicaid, and utility assistance Medicare Rights Center: 800-333-4114 Helps people with Medicare understand their rights and benefits, navigate the Medicare system, and secure the  quality healthcare they deserve SCAT: 336-333-6589 [place] [employer]'s shared-ride transportation service for eligible riders who have a disability that prevents them from riding the fixed route bus.   For additional information on assistance programs please contact our social worker:   Grier Hock/Abigail Elmore:  [phone] 

## 2017-01-01 NOTE — Telephone Encounter (Signed)
Left message with new appt times. Scheduled appts per sch message from [facility] .

## 2017-01-01 NOTE — Telephone Encounter (Signed)
Gave patient avs report and appointments for April. Per desk nurse lab/fu should be 4/24 with next chemo.

## 2017-01-01 NOTE — Patient Instructions (Signed)
Dehydration, Adult Dehydration is a condition in which there is not enough fluid or water in the body. This happens when you lose more fluids than you take in. Important organs, such as the kidneys, brain, and heart, cannot function without a proper amount of fluids. Any loss of fluids from the body can lead to dehydration. Dehydration can range from mild to severe. This condition should be treated right away to prevent it from becoming severe. What are the causes? This condition may be caused by:  Vomiting.  Diarrhea.  Excessive sweating, such as from heat exposure or exercise.  Not drinking enough fluid, especially:  When ill.  While doing activity that requires a lot of energy.  Excessive urination.  Fever.  Infection.  Certain medicines, such as medicines that cause the body to lose excess fluid (diuretics).  Inability to access safe drinking water.  Reduced physical ability to get adequate water and food. What increases the risk? This condition is more likely to develop in people:  Who have a poorly controlled long-term (chronic) illness, such as diabetes, heart disease, or kidney disease.  Who are age 41 or older.  Who are disabled.  Who live in a place with high altitude.  Who play endurance sports. What are the signs or symptoms? Symptoms of mild dehydration may include:   Thirst.  Dry lips.  Slightly dry mouth.  Dry, warm skin.  Dizziness. Symptoms of moderate dehydration may include:   Very dry mouth.  Muscle cramps.  Dark urine. Urine may be the color of tea.  Decreased urine production.  Decreased tear production.  Heartbeat that is irregular or faster than normal (palpitations).  Headache.  Light-headedness, especially when you stand up from a sitting position.  Fainting (syncope). Symptoms of severe dehydration may include:   Changes in skin, such as:  Cold and clammy skin.  Blotchy (mottled) or pale skin.  Skin that does  not quickly return to normal after being lightly pinched and released (poor skin turgor).  Changes in body fluids, such as:  Extreme thirst.  No tear production.  Inability to sweat when body temperature is high, such as in hot weather.  Very little urine production.  Changes in vital signs, such as:  Weak pulse.  Pulse that is more than 100 beats a minute when sitting still.  Rapid breathing.  Low blood pressure.  Other changes, such as:  Sunken eyes.  Cold hands and feet.  Confusion.  Lack of energy (lethargy).  Difficulty waking up from sleep.  Short-term weight loss.  Unconsciousness. How is this diagnosed? This condition is diagnosed based on your symptoms and a physical exam. Blood and urine tests may be done to help confirm the diagnosis. How is this treated? Treatment for this condition depends on the severity. Mild or moderate dehydration can often be treated at home. Treatment should be started right away. Do not wait until dehydration becomes severe. Severe dehydration is an emergency and it needs to be treated in a hospital. Treatment for mild dehydration may include:   Drinking more fluids.  Replacing salts and minerals in your blood (electrolytes) that you may have lost. Treatment for moderate dehydration may include:   Drinking an oral rehydration solution (ORS). This is a drink that helps you replace fluids and electrolytes (rehydrate). It can be found at pharmacies and retail stores. Treatment for severe dehydration may include:   Receiving fluids through an IV tube.  Receiving an electrolyte solution through a feeding tube that is  passed through your nose and into your stomach (nasogastric tube, or NG tube).  Correcting any abnormalities in electrolytes.  Treating the underlying cause of dehydration. Follow these instructions at home:  If directed by your health care provider, drink an ORS:  Make an ORS by following instructions on the  package.  Start by drinking small amounts, about  cup (120 mL) every 5-10 minutes.  Slowly increase how much you drink until you have taken the amount recommended by your health care provider.  Drink enough clear fluid to keep your urine clear or pale yellow. If you were told to drink an ORS, finish the ORS first, then start slowly drinking other clear fluids. Drink fluids such as:  Water. Do not drink only water. Doing that can lead to having too little salt (sodium) in the body (hyponatremia).  Ice chips.  Fruit juice that you have added water to (diluted fruit juice).  Low-calorie sports drinks.  Avoid:  Alcohol.  Drinks that contain a lot of sugar. These include high-calorie sports drinks, fruit juice that is not diluted, and soda.  Caffeine.  Foods that are greasy or contain a lot of fat or sugar.  Take over-the-counter and prescription medicines only as told by your health care provider.  Do not take sodium tablets. This can lead to having too much sodium in the body (hypernatremia).  Eat foods that contain a healthy balance of electrolytes, such as bananas, oranges, potatoes, tomatoes, and spinach.  Keep all follow-up visits as told by your health care provider. This is important. Contact a health care provider if:  You have abdominal pain that:  Gets worse.  Stays in one area (localizes).  You have a rash.  You have a stiff neck.  You are more irritable than usual.  You are sleepier or more difficult to wake up than usual.  You feel weak or dizzy.  You feel very thirsty.  You have urinated only a small amount of very dark urine over 6-8 hours. Get help right away if:  You have symptoms of severe dehydration.  You cannot drink fluids without vomiting.  Your symptoms get worse with treatment.  You have a fever.  You have a severe headache.  You have vomiting or diarrhea that:  Gets worse.  Does not go away.  You have blood or green matter  (bile) in your vomit.  You have blood in your stool. This may cause stool to look black and tarry.  You have not urinated in 6-8 hours.  You faint.  Your heart rate while sitting still is over 100 beats a minute.  You have trouble breathing. This information is not intended to replace advice given to you by your health care provider. Make sure you discuss any questions you have with your health care provider. Document Released: 09/09/2005 Document Revised: 04/05/2016 Document Reviewed: 11/03/2015 [employer] Interactive Patient Education  2017 [employer].

## 2017-01-02 ENCOUNTER — Other Ambulatory Visit: Payer: BLUE CROSS/BLUE SHIELD

## 2017-01-02 ENCOUNTER — Ambulatory Visit: Payer: BLUE CROSS/BLUE SHIELD | Admitting: Hematology

## 2017-01-02 LAB — PHOSPHORUS: Phosphorus, Ser: 3.1 mg/dL (ref 2.5–4.5)

## 2017-01-05 NOTE — Progress Notes (Signed)
Marland Kitchen    HEMATOLOGY/ONCOLOGY CLINIC NOTE  Date of Service: .01/01/2017  Patient Care Team: Asencion Noble, MD as PCP - General (Internal Medicine)  CHIEF COMPLAINTS/PURPOSE OF CONSULTATION:   f/u for high grade follicular lymphoma  HISTORY OF PRESENTING ILLNESS:   Katelyn Reeves is a wonderful 66 y.o. female who has been referred to [place] by Dr .Asencion Noble, MD for evaluation and management of suspected non-Hodgkin's lymphoma.  Patient notes that she presented with back pain for 4-6 weeks and eventually had pain radiating to her right inguinal area. She notes that she has had interstitial cystitis in the past. She was thought to have a urinary tract infection and was treated with Macrodantin. Says the pain got worse and started radiating into the right groin she had a CT of the abdomen renal stone protocol on 10/25/2016 which showed extensive retroperitoneal lymphadenopathy concerning for lymphoproliferative disorder. She was incidentally noted to have a nodular border of the liver with possible liver cirrhosis. No urinary stones noted.  She subsequently had a PET CT scan ordered by her primary care physician which was done on 11/07/2016. This showed diffuse hypermetabolic spleen with mild splenomegaly. Hypermetabolic adenopathy in the left supraclavicular, bilateral axillary, peripancreatic, gastrohepatic ligament, celiac, porta hepatis, periaortic, and left external iliac chains. Hypermetabolic mesenteric lymph nodes. Appearance favors lymphoma.  Patient notes some night sweats for 2 weeks. Has lost about 10 pounds in the last month. No acute fevers or chills. Notes the back pain is controlled with when necessary Vicodin.  INTERVAL HISTORY  Katelyn Reeves is here for her an earlier than scheduled followup for toxicity check after her 1st cycle of R-CHOP ctx. She tolerated the chemotherapy well with no nausea or hypersensitivity reactions. She presents today with severe back spasms which were in bearable  and not controlled with her oral oxycodone. She received IV Toradol, IV Dilaudid IV fluids, IV Ativan and her symptoms were improved. She get an x-ray of her lower back which showed degenerative arthritis but no acute fractures. No abdominal symptoms no change in urination no change in bowel habits no fevers or chills. Did not seem to be overt bone pains related to Neulasta.  MEDICAL HISTORY:  #1 fibrocystic disease of the breast #2 hypertension #3 constipation #4 duplicated right sided ureters #5 imaging concern for possible liver cirrhosis  SURGICAL HISTORY:  #1 total abdominal hysterectomy with bilateral salpingo-oophorectomy for ovarian cyst. #2 Lipoma removal left upper back #3 hemorrhoidectomy  SOCIAL HISTORY: Social History   Social History  . Marital status: Divorced    Spouse name: N/A  . Number of children: N/A  . Years of education: N/A   Occupational History  . Not on file.   Social History Main Topics  . Smoking status: Never Smoker  . Smokeless tobacco: Never Used  . Alcohol use No  . Drug use: No  . Sexual activity: Not on file   Other Topics Concern  . Not on file   Social History Narrative  . No narrative on file  Nonsmoker no issues with alcohol use or drug use. 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   FAMILY HISTORY:  Notes her mother had ovarian cancer followed by leukemia and died at age 49 years Maternal aunt gastric cancer  ALLERGIES:  is allergic to sulfa antibiotics.  MEDICATIONS:  Current Outpatient Prescriptions  Medication Sig Dispense Refill  . allopurinol (ZYLOPRIM) 300 MG tablet Take 1 tablet (300 mg total) by mouth daily. 30 tablet  3  . HYDROcodone-acetaminophen (NORCO) 5-325 MG tablet Take 1-2 tablets by mouth every 6 (six) hours as needed for moderate pain or severe pain. 30 tablet 0  . lidocaine-prilocaine (EMLA) cream Apply to affected area once 30 g 3  . ondansetron (ZOFRAN) 8 MG  tablet Take 1 tablet (8 mg total) by mouth 2 (two) times daily as needed for refractory nausea / vomiting. Start on day 3 after cyclophosphamide. 30 tablet 1  . predniSONE (DELTASONE) 20 MG tablet Take 3 tablets (60 mg total) by mouth daily. Take on days 1-5 of chemotherapy. 15 tablet 6  . prochlorperazine (COMPAZINE) 10 MG tablet Take 1 tablet (10 mg total) by mouth every 6 (six) hours as needed (Nausea or vomiting). 30 tablet 6  . senna-docusate (SENNA S) 8.6-50 MG tablet Take 2 tablets by mouth at bedtime as needed for mild constipation or moderate constipation. 60 tablet 1  . naproxen (NAPROSYN) 500 MG tablet Take 1 tablet (500 mg total) by mouth 2 (two) times daily with a meal. For 3 days then 1 tab po BID as needed 20 tablet 0  . tiZANidine (ZANAFLEX) 2 MG tablet Take 2 tablets (4 mg total) by mouth every 8 (eight) hours as needed for muscle spasms. 30 tablet 0   No current facility-administered medications for this visit.     REVIEW OF SYSTEMS:    10 Point review of Systems was done is negative except as noted above.  PHYSICAL EXAMINATION: ECOG PERFORMANCE STATUS: 1 - Symptomatic but completely ambulatory  . Vitals:   01/01/17 1306  BP: 123/67  Pulse: 90  Resp: 18  Temp: 98.3 F (36.8 C)   Filed Weights   .There is no height or weight on file to calculate BMI.  GENERAL:alert, in no acute distress and comfortable SKIN: skin color, texture, turgor are normal, no rashes or significant lesions EYES: normal, conjunctiva are pink and non-injected, sclera clear OROPHARYNX:no exudate, no erythema and lips, buccal mucosa, and tongue normal  NECK: supple, no JVD, thyroid normal size, non-tender, without nodularity LYMPH: palpable left supraclavicular and left axillary lymph node. Smaller right axillary lymph node. LUNGS: clear to auscultation with normal respiratory effort HEART: regular rate & rhythm,  no murmurs and no lower extremity edema ABDOMEN: abdomen soft, non-tender,  normoactive bowel sounds , mild tenderness to palpation in left upper abdomen. Musculoskeletal: no cyanosis of digits and no clubbing  PSYCH: alert & oriented x 3 with fluent speech NEURO: no focal motor/sensory deficits  LABORATORY DATA:  I have reviewed the data as listed  . CBC Latest Ref Rng & Units 01/01/2017 12/20/2016 11/27/2016  WBC 3.9 - 10.3 10e3/uL 3.5(L) 5.0 5.7  Hemoglobin 11.6 - 15.9 g/dL 12.1 12.1 12.0  Hematocrit 34.8 - 46.6 % 35.0 35.8(L) 35.9(L)  Platelets 145 - 400 10e3/uL 170 227 249   CBC    Component Value Date/Time   WBC 3.5 (L) 01/01/2017 1245   WBC 5.0 12/20/2016 1141   RBC 4.14 01/01/2017 1245   RBC 4.16 12/20/2016 1141   HGB 12.1 01/01/2017 1245   HCT 35.0 01/01/2017 1245   PLT 170 01/01/2017 1245   MCV 84.5 01/01/2017 1245   MCH 29.2 01/01/2017 1245   MCH 29.1 12/20/2016 1141   MCHC 34.6 01/01/2017 1245   MCHC 33.8 12/20/2016 1141   RDW 13.1 01/01/2017 1245   LYMPHSABS 1.5 01/01/2017 1245   MONOABS 0.5 01/01/2017 1245   EOSABS 0.2 01/01/2017 1245   BASOSABS 0.1 01/01/2017 1245   . CMP  Latest Ref Rng & Units 01/01/2017 12/24/2016 11/12/2016  Glucose 70 - 140 mg/dl 102 115 107  BUN 7.0 - 26.0 mg/dL 12.8 14.2 11.9  Creatinine 0.6 - 1.1 mg/dL 0.7 0.7 0.8  Sodium 136 - 145 mEq/L 137 140 140  Potassium 3.5 - 5.1 mEq/L 3.8 4.1 4.2  CO2 22 - 29 mEq/L 20(L) 23 24  Calcium 8.4 - 10.4 mg/dL 9.9 9.5 9.6  Total Protein 6.4 - 8.3 g/dL 7.7 7.7 8.3  Total Bilirubin 0.20 - 1.20 mg/dL 0.54 0.35 0.49  Alkaline Phos 40 - 150 U/L 88 77 95  AST 5 - 34 U/L 16 21 22   ALT 0 - 55 U/L 17 13 16    . Lab Results  Component Value Date   LDH 210 11/12/2016   Component     Latest Ref Rng & Units 11/12/2016  Hep C Virus Ab     0.0 - 0.9 s/co ratio <0.1  Hepatitis B Surface Ag     Negative Negative  Hep B Core Ab, Tot     Negative Negative   Component     Latest Ref Rng & Units 11/12/2016  Protime     10.6 - 13.4 Seconds 12.0  INR     2.00 - 3.50 1.00 (L)    Lovenox      No  APTT     24 - 33 sec 27       *[facility]*                  *[facility] [employer].                        [place], [place] [address]                            [phone]  ------------------------------------------------------------------- Transthoracic Echocardiography  Patient:    Katelyn Reeves, Katelyn Reeves MR #:       [id number] Study Date: 11/19/2016 Gender:     F Age:        70 Height:     160 cm Weight:     90.2 kg BSA:        2.04 m^2 Pt. Status: Room:   SONOGRAPHER  Tresa Res, RDCS  PERFORMING   [facility], Outpatient  ATTENDING    Roselee Nova     [place], [place] K[place]  REFERRING    [place], [place] Kishore  cc:  ------------------------------------------------------------------- LV EF: 60% -   65%  ------------------------------------------------------------------- Indications:      V58.11 Chemotherapy Evaluation.  ------------------------------------------------------------------- History:   Risk factors:  Lymphoma. Obese.  ------------------------------------------------------------------- Study Conclusions  - Left ventricle: The cavity size was normal. There was mild   concentric hypertrophy. Systolic function was normal. The   estimated ejection fraction was in the range of 60% to 65%. Wall   motion was normal; there were no regional wall motion   abnormalities. Doppler parameters are consistent with abnormal   left ventricular relaxation (grade 1 diastolic dysfunction).   Doppler parameters are consistent with indeterminate ventricular   filling pressure. - Aortic valve: Transvalvular velocity was within the normal range.   There was no stenosis. There was trivial regurgitation. - Mitral valve: Transvalvular velocity was within the normal range.  There was no evidence for stenosis. There was trivial   regurgitation. - Right ventricle: The cavity size was normal. Wall  thickness was   normal. Systolic function was normal. - Atrial septum: No defect or patent foramen ovale was identified   by color flow Doppler. - Tricuspid valve: There was no regurgitation. - Global longitudinal strain -17.6%   RADIOGRAPHIC STUDIES: I have personally reviewed the radiological images as listed and agreed with the findings in the report. Dg Lumbar Spine 2-3 Views  Result Date: 01/01/2017 CLINICAL DATA:  Patient suddenly felt low back and tailbone pain this am; no pain down either leg; no injury EXAM: LUMBAR SPINE - 2-3 VIEW COMPARISON:  None. FINDINGS: Normal alignment of lumbar vertebral bodies. No loss of vertebral body height or disc height. No pars fracture. No subluxation. IMPRESSION: No acute osseous abnormality. Electronically Signed   By: Suzy Bouchard M.D.   On: 01/01/2017 16:04   Dg Sacrum/coccyx  Result Date: 01/01/2017 CLINICAL DATA:  Patient suddenly felt low back and tailbone pain this am; no pain down either leg; no injury EXAM: SACRUM AND COCCYX - 2+ VIEW COMPARISON:  None. FINDINGS: No fracture of the sacrum or coccyx identified. Degenerate spurring of lumbar spine. IMPRESSION: No acute osseous abnormality. Electronically Signed   By: Suzy Bouchard M.D.   On: 01/01/2017 16:05   Ir US Guide Vasc Access Right  Result Date: 12/20/2016 INDICATION: 66 year old with grade 3 follicular lymphoma of lymph nodes of multiple regions. EXAM: FLUOROSCOPIC AND ULTRASOUND GUIDED PLACEMENT OF A SUBCUTANEOUS PORT COMPARISON:  None. MEDICATIONS: Ancef 2 g; The antibiotic was administered within an appropriate time interval prior to skin puncture. ANESTHESIA/SEDATION: Versed 6.0 mg IV; Fentanyl 100 mcg IV; Moderate Sedation Time:  43 minutes The patient was continuously monitored during the procedure by the interventional radiology nurse under my direct supervision. FLUOROSCOPY TIME:  48 seconds, 8 mGy COMPLICATIONS: None immediate. PROCEDURE: The procedure, risks, benefits,  and alternatives were explained to the patient. Questions regarding the procedure were encouraged and answered. The patient understands and consents to the procedure. Patient was placed supine on the interventional table. Ultrasound confirmed a patent right internal jugular vein. The right chest and neck were cleaned with a skin antiseptic and a sterile drape was placed. Maximal barrier sterile technique was utilized including caps, mask, sterile gowns, sterile gloves, sterile drape, hand hygiene and skin antiseptic. The right neck was anesthetized with 1% lidocaine. Small incision was made in the right neck with a blade. Micropuncture set was placed in the right internal jugular vein with ultrasound guidance. The micropuncture wire was used for measurement purposes. The right chest was anesthetized with 1% lidocaine with epinephrine. #15 blade was used to make an incision and a subcutaneous port pocket was formed. [place] was assembled. Subcutaneous tunnel was formed with a stiff tunneling device. The port catheter was brought through the subcutaneous tunnel. The port was placed in the subcutaneous pocket and sutured in place. The micropuncture set was exchanged for a peel-away sheath. The catheter was placed through the peel-away sheath and the tip was positioned in the lower SVC. Catheter placement was confirmed with fluoroscopy. The port was accessed and flushed with heparinized saline. The port pocket was closed using two layers of absorbable sutures and skin glue. The vein skin site was closed using a single layer of absorbable suture and skin glue. Sterile dressings were applied. Patient tolerated the procedure well without an immediate complication. Ultrasound and fluoroscopic images were taken  and saved for this procedure. IMPRESSION: Placement of a subcutaneous port device. The catheter tip is in lower SVC and ready to be used. Electronically Signed   By: Markus Daft M.D.   On: 12/20/2016 17:37     Ir Fluoro Guide Port Insertion Right  Result Date: 12/20/2016 INDICATION: 66 year old with grade 3 follicular lymphoma of lymph nodes of multiple regions. EXAM: FLUOROSCOPIC AND ULTRASOUND GUIDED PLACEMENT OF A SUBCUTANEOUS PORT COMPARISON:  None. MEDICATIONS: Ancef 2 g; The antibiotic was administered within an appropriate time interval prior to skin puncture. ANESTHESIA/SEDATION: Versed 6.0 mg IV; Fentanyl 100 mcg IV; Moderate Sedation Time:  43 minutes The patient was continuously monitored during the procedure by the interventional radiology nurse under my direct supervision. FLUOROSCOPY TIME:  48 seconds, 8 mGy COMPLICATIONS: None immediate. PROCEDURE: The procedure, risks, benefits, and alternatives were explained to the patient. Questions regarding the procedure were encouraged and answered. The patient understands and consents to the procedure. Patient was placed supine on the interventional table. Ultrasound confirmed a patent right internal jugular vein. The right chest and neck were cleaned with a skin antiseptic and a sterile drape was placed. Maximal barrier sterile technique was utilized including caps, mask, sterile gowns, sterile gloves, sterile drape, hand hygiene and skin antiseptic. The right neck was anesthetized with 1% lidocaine. Small incision was made in the right neck with a blade. Micropuncture set was placed in the right internal jugular vein with ultrasound guidance. The micropuncture wire was used for measurement purposes. The right chest was anesthetized with 1% lidocaine with epinephrine. #15 blade was used to make an incision and a subcutaneous port pocket was formed. [place] was assembled. Subcutaneous tunnel was formed with a stiff tunneling device. The port catheter was brought through the subcutaneous tunnel. The port was placed in the subcutaneous pocket and sutured in place. The micropuncture set was exchanged for a peel-away sheath. The catheter was placed  through the peel-away sheath and the tip was positioned in the lower SVC. Catheter placement was confirmed with fluoroscopy. The port was accessed and flushed with heparinized saline. The port pocket was closed using two layers of absorbable sutures and skin glue. The vein skin site was closed using a single layer of absorbable suture and skin glue. Sterile dressings were applied. Patient tolerated the procedure well without an immediate complication. Ultrasound and fluoroscopic images were taken and saved for this procedure. IMPRESSION: Placement of a subcutaneous port device. The catheter tip is in lower SVC and ready to be used. Electronically Signed   By: Markus Daft M.D.   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 Also noted to have left supraclavicular and bilateral axillary left more than right FDG avid lymphadenopathy. Based on imaging this would represent at least Stage III disease if this were a lymphoma. LDH level is not significantly elevated. Blood counts are stable. PET/CT scan does show fairly active disease which is FDG avid with FDG avid splenomegaly. Patient has some constitutional symptoms with about a 10 pound weight loss and some night sweats. Hepatitis profile negative. No other obvious new focal symptoms.  Patient has had an ECHo which shows nl EF Plan - Patient's labs are stable with no  overt chemotherapy related toxicities. -Has received Neulasta to minimize the degree and duration of neutropenia.  #2 severe bilateral back spasms ? Flare of osteoarthritis vs Vincristine toxicity. These were not controlled by her by mouth oxycodone at home. No overt focal bone pains. No new neurological  symptoms. Labs stable. Plan -Patient had lumbar and lumbosacral x-rays which showed no new osseous lesions. -Was treated with IV Dilaudid, IV Toradol, IV Ativan as a muscle relaxant and received IV fluids. -Symptoms are resolved and patient was discharged home in stable condition. -She was asked to take naproxen twice a day for 2-3 days and tizanidine when necessary for muscle cramps. -I: On the following day and she notes the pain was well-controlled with no return of further muscle cramps.   -we will plan to rpt PET/CT after 3 cycles of rx and plan to treat with 6 cycles of R-CHOP if tolerated.  Stat X Ray Lumbar and Sacral spine today IV NS and IV pain meds/ativan/solumedrol today. RTC with Dr Irene Limbo C2D1 of R-CHOP chemotherapy   All of the patients questions were answered with apparent satisfaction. The patient knows to call the clinic with any problems, questions or concerns.  I spent 25 minutes counseling the patient face to face. The total time spent in the appointment was 30 minutes and more than 50% was on counseling and direct patient cares.    Sullivan Lone MD Amelia AAHIVMS [facility] Hematology/Oncology Physician [facility]  (Office):       [phone] (Work cell):  [phone] (Fax):           [phone]

## 2017-01-14 ENCOUNTER — Other Ambulatory Visit: Payer: BLUE CROSS/BLUE SHIELD

## 2017-01-14 ENCOUNTER — Ambulatory Visit (HOSPITAL_BASED_OUTPATIENT_CLINIC_OR_DEPARTMENT_OTHER): Payer: BLUE CROSS/BLUE SHIELD

## 2017-01-14 ENCOUNTER — Other Ambulatory Visit (HOSPITAL_BASED_OUTPATIENT_CLINIC_OR_DEPARTMENT_OTHER): Payer: BLUE CROSS/BLUE SHIELD

## 2017-01-14 ENCOUNTER — Ambulatory Visit (HOSPITAL_BASED_OUTPATIENT_CLINIC_OR_DEPARTMENT_OTHER): Payer: BLUE CROSS/BLUE SHIELD | Admitting: Hematology

## 2017-01-14 ENCOUNTER — Encounter: Payer: Self-pay | Admitting: Hematology

## 2017-01-14 ENCOUNTER — Encounter: Payer: Self-pay | Admitting: *Deleted

## 2017-01-14 ENCOUNTER — Ambulatory Visit: Payer: BLUE CROSS/BLUE SHIELD | Admitting: Hematology

## 2017-01-14 ENCOUNTER — Ambulatory Visit: Payer: BLUE CROSS/BLUE SHIELD

## 2017-01-14 VITALS — BP 127/75 | HR 97 | Temp 98.7°F | Resp 16

## 2017-01-14 VITALS — BP 143/87 | HR 87 | Temp 98.4°F | Resp 16 | Wt 193.5 lb

## 2017-01-14 DIAGNOSIS — C8248 Follicular lymphoma grade IIIb, lymph nodes of multiple sites: Secondary | ICD-10-CM

## 2017-01-14 DIAGNOSIS — Z5112 Encounter for antineoplastic immunotherapy: Secondary | ICD-10-CM | POA: Diagnosis not present

## 2017-01-14 DIAGNOSIS — Z5111 Encounter for antineoplastic chemotherapy: Secondary | ICD-10-CM | POA: Diagnosis not present

## 2017-01-14 DIAGNOSIS — G629 Polyneuropathy, unspecified: Secondary | ICD-10-CM | POA: Diagnosis not present

## 2017-01-14 LAB — COMPREHENSIVE METABOLIC PANEL WITH GFR
ALT: 18 U/L (ref 0–55)
AST: 19 U/L (ref 5–34)
Albumin: 3.9 g/dL (ref 3.5–5.0)
Alkaline Phosphatase: 81 U/L (ref 40–150)
Anion Gap: 10 meq/L (ref 3–11)
BUN: 11.3 mg/dL (ref 7.0–26.0)
CO2: 25 meq/L (ref 22–29)
Calcium: 9.5 mg/dL (ref 8.4–10.4)
Chloride: 106 meq/L (ref 98–109)
Creatinine: 0.7 mg/dL (ref 0.6–1.1)
EGFR: 84 mL/min/{1.73_m2} — ABNORMAL LOW
Glucose: 167 mg/dL — ABNORMAL HIGH (ref 70–140)
Potassium: 3.9 meq/L (ref 3.5–5.1)
Sodium: 141 meq/L (ref 136–145)
Total Bilirubin: 0.33 mg/dL (ref 0.20–1.20)
Total Protein: 7.3 g/dL (ref 6.4–8.3)

## 2017-01-14 LAB — CBC WITH DIFFERENTIAL/PLATELET
BASO%: 1.4 % (ref 0.0–2.0)
Basophils Absolute: 0.1 10*3/uL (ref 0.0–0.1)
EOS%: 1.1 % (ref 0.0–7.0)
Eosinophils Absolute: 0.1 10*3/uL (ref 0.0–0.5)
HEMATOCRIT: 34.7 % — AB (ref 34.8–46.6)
HEMOGLOBIN: 11.9 g/dL (ref 11.6–15.9)
LYMPH#: 1.3 10*3/uL (ref 0.9–3.3)
LYMPH%: 21.9 % (ref 14.0–49.7)
MCH: 29.4 pg (ref 25.1–34.0)
MCHC: 34.4 g/dL (ref 31.5–36.0)
MCV: 85.5 fL (ref 79.5–101.0)
MONO#: 0.5 10*3/uL (ref 0.1–0.9)
MONO%: 8.7 % (ref 0.0–14.0)
NEUT%: 66.9 % (ref 38.4–76.8)
NEUTROS ABS: 4.1 10*3/uL (ref 1.5–6.5)
Platelets: 315 10*3/uL (ref 145–400)
RBC: 4.06 10*6/uL (ref 3.70–5.45)
RDW: 14.5 % (ref 11.2–14.5)
WBC: 6.2 10*3/uL (ref 3.9–10.3)

## 2017-01-14 MED ORDER — SODIUM CHLORIDE 0.9 % IV SOLN
2.0000 mg | Freq: Once | INTRAVENOUS | Status: AC
  Administered 2017-01-14: 2 mg via INTRAVENOUS
  Filled 2017-01-14: qty 2

## 2017-01-14 MED ORDER — SODIUM CHLORIDE 0.9 % IV SOLN
750.0000 mg/m2 | Freq: Once | INTRAVENOUS | Status: AC
  Administered 2017-01-14: 1480 mg via INTRAVENOUS
  Filled 2017-01-14: qty 74

## 2017-01-14 MED ORDER — ACETAMINOPHEN 325 MG PO TABS
650.0000 mg | ORAL_TABLET | Freq: Once | ORAL | Status: AC
  Administered 2017-01-14: 650 mg via ORAL

## 2017-01-14 MED ORDER — PEGFILGRASTIM 6 MG/0.6ML ~~LOC~~ PSKT
6.0000 mg | PREFILLED_SYRINGE | Freq: Once | SUBCUTANEOUS | Status: AC
  Administered 2017-01-14: 6 mg via SUBCUTANEOUS
  Filled 2017-01-14: qty 0.6

## 2017-01-14 MED ORDER — HEPARIN SOD (PORK) LOCK FLUSH 100 UNIT/ML IV SOLN
500.0000 [IU] | Freq: Once | INTRAVENOUS | Status: AC | PRN
  Administered 2017-01-14: 500 [IU]
  Filled 2017-01-14: qty 5

## 2017-01-14 MED ORDER — DOXORUBICIN HCL CHEMO IV INJECTION 2 MG/ML
50.0000 mg/m2 | Freq: Once | INTRAVENOUS | Status: AC
  Administered 2017-01-14: 98 mg via INTRAVENOUS
  Filled 2017-01-14: qty 49

## 2017-01-14 MED ORDER — SODIUM CHLORIDE 0.9 % IV SOLN
Freq: Once | INTRAVENOUS | Status: AC
Start: 2017-01-14 — End: 2017-01-14
  Administered 2017-01-14: 11:00:00 via INTRAVENOUS

## 2017-01-14 MED ORDER — DIPHENHYDRAMINE HCL 25 MG PO CAPS
50.0000 mg | ORAL_CAPSULE | Freq: Once | ORAL | Status: AC
  Administered 2017-01-14: 50 mg via ORAL

## 2017-01-14 MED ORDER — SODIUM CHLORIDE 0.9 % IV SOLN
375.0000 mg/m2 | Freq: Once | INTRAVENOUS | Status: AC
  Administered 2017-01-14: 700 mg via INTRAVENOUS
  Filled 2017-01-14: qty 50

## 2017-01-14 MED ORDER — DEXAMETHASONE SODIUM PHOSPHATE 10 MG/ML IJ SOLN
10.0000 mg | Freq: Once | INTRAMUSCULAR | Status: AC
  Administered 2017-01-14: 10 mg via INTRAVENOUS

## 2017-01-14 MED ORDER — PALONOSETRON HCL INJECTION 0.25 MG/5ML
INTRAVENOUS | Status: AC
  Filled 2017-01-14: qty 5

## 2017-01-14 MED ORDER — DEXAMETHASONE SODIUM PHOSPHATE 10 MG/ML IJ SOLN
INTRAMUSCULAR | Status: AC
  Filled 2017-01-14: qty 1

## 2017-01-14 MED ORDER — DIPHENHYDRAMINE HCL 25 MG PO CAPS
ORAL_CAPSULE | ORAL | Status: AC
  Filled 2017-01-14: qty 2

## 2017-01-14 MED ORDER — ACETAMINOPHEN 325 MG PO TABS
ORAL_TABLET | ORAL | Status: AC
  Filled 2017-01-14: qty 2

## 2017-01-14 MED ORDER — SODIUM CHLORIDE 0.9% FLUSH
10.0000 mL | INTRAVENOUS | Status: DC | PRN
  Administered 2017-01-14: 10 mL
  Filled 2017-01-14: qty 10

## 2017-01-14 MED ORDER — PALONOSETRON HCL INJECTION 0.25 MG/5ML
0.2500 mg | Freq: Once | INTRAVENOUS | Status: AC
Start: 2017-01-14 — End: 2017-01-14
  Administered 2017-01-14: 0.25 mg via INTRAVENOUS

## 2017-01-14 NOTE — Patient Instructions (Signed)
Implanted Port Home Guide An implanted port is a type of central line that is placed under the skin. Central lines are used to provide IV access when treatment or nutrition needs to be given through a person's veins. Implanted ports are used for long-term IV access. An implanted port may be placed because:  You need IV medicine that would be irritating to the small veins in your hands or arms.  You need long-term IV medicines, such as antibiotics.  You need IV nutrition for a long period.  You need frequent blood draws for lab tests.  You need dialysis.  Implanted ports are usually placed in the chest area, but they can also be placed in the upper arm, the abdomen, or the leg. An implanted port has two main parts:  Reservoir. The reservoir is round and will appear as a small, raised area under your skin. The reservoir is the part where a needle is inserted to give medicines or draw blood.  Catheter. The catheter is a thin, flexible tube that extends from the reservoir. The catheter is placed into a large vein. Medicine that is inserted into the reservoir goes into the catheter and then into the vein.  How will I care for my incision site? Do not get the incision site wet. Bathe or shower as directed by your health care provider. How is my port accessed? Special steps must be taken to access the port:  Before the port is accessed, a numbing cream can be placed on the skin. This helps numb the skin over the port site.  Your health care provider uses a sterile technique to access the port. ? Your health care provider must put on a mask and sterile gloves. ? The skin over your port is cleaned carefully with an antiseptic and allowed to dry. ? The port is gently pinched between sterile gloves, and a needle is inserted into the port.  Only "non-coring" port needles should be used to access the port. Once the port is accessed, a blood return should be checked. This helps ensure that the port  is in the vein and is not clogged.  If your port needs to remain accessed for a constant infusion, a clear (transparent) bandage will be placed over the needle site. The bandage and needle will need to be changed every week, or as directed by your health care provider.  Keep the bandage covering the needle clean and dry. Do not get it wet. Follow your health care provider's instructions on how to take a shower or bath while the port is accessed.  If your port does not need to stay accessed, no bandage is needed over the port.  What is flushing? Flushing helps keep the port from getting clogged. Follow your health care provider's instructions on how and when to flush the port. Ports are usually flushed with saline solution or a medicine called heparin. The need for flushing will depend on how the port is used.  If the port is used for intermittent medicines or blood draws, the port will need to be flushed: ? After medicines have been given. ? After blood has been drawn. ? As part of routine maintenance.  If a constant infusion is running, the port may not need to be flushed.  How long will my port stay implanted? The port can stay in for as long as your health care provider thinks it is needed. When it is time for the port to come out, surgery will be   done to remove it. The procedure is similar to the one performed when the port was put in. When should I seek immediate medical care? When you have an implanted port, you should seek immediate medical care if:  You notice a bad smell coming from the incision site.  You have swelling, redness, or drainage at the incision site.  You have more swelling or pain at the port site or the surrounding area.  You have a fever that is not controlled with medicine.  This information is not intended to replace advice given to you by your health care provider. Make sure you discuss any questions you have with your health care provider. Document  Released: 09/09/2005 Document Revised: 02/15/2016 Document Reviewed: 05/17/2013 [employer] Interactive Patient Education  2017 [employer]  

## 2017-01-14 NOTE — Progress Notes (Signed)
Marland Kitchen    HEMATOLOGY/ONCOLOGY CLINIC NOTE  Date of Service: .01/14/2017  Patient Care Team: Asencion Noble, MD as PCP - General (Internal Medicine)  CHIEF COMPLAINTS/PURPOSE OF CONSULTATION:   f/u for high grade follicular lymphoma  HISTORY OF PRESENTING ILLNESS:   Katelyn Reeves is a wonderful 66 y.o. female who has been referred to [place] by Dr .Asencion Noble, MD for evaluation and management of suspected non-Hodgkin's lymphoma.  Patient notes that she presented with back pain for 4-6 weeks and eventually had pain radiating to her right inguinal area. She notes that she has had interstitial cystitis in the past. She was thought to have a urinary tract infection and was treated with Macrodantin. Says the pain got worse and started radiating into the right groin she had a CT of the abdomen renal stone protocol on 10/25/2016 which showed extensive retroperitoneal lymphadenopathy concerning for lymphoproliferative disorder. She was incidentally noted to have a nodular border of the liver with possible liver cirrhosis. No urinary stones noted.  She subsequently had a PET CT scan ordered by her primary care physician which was done on 11/07/2016. This showed diffuse hypermetabolic spleen with mild splenomegaly. Hypermetabolic adenopathy in the left supraclavicular, bilateral axillary, peripancreatic, gastrohepatic ligament, celiac, porta hepatis, periaortic, and left external iliac chains. Hypermetabolic mesenteric lymph nodes. Appearance favors lymphoma.  Patient notes some night sweats for 2 weeks. Has lost about 10 pounds in the last month. No acute fevers or chills. Notes the back pain is controlled with when necessary Vicodin.  INTERVAL HISTORY  Katelyn Reeves is here for her scheduled follow-up prior to her second cycle of R CHOP chemotherapy. She notes no acute new issues. Feels well. No nausea no vomiting. 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 She notes that most of the  symptoms have resolved.  No further back pains. No fevers no chills no night sweats. She is working with her job and with the Education officer, museum to determine her best options for short-term disability/FMLA/work restriction accommodations.  MEDICAL HISTORY:  #1 fibrocystic disease of the breast #2 hypertension #3 constipation #4 duplicated right sided ureters #5 imaging concern for possible liver cirrhosis  SURGICAL HISTORY:  #1 total abdominal hysterectomy with bilateral salpingo-oophorectomy for ovarian cyst. #2 Lipoma removal left upper back #3 hemorrhoidectomy  SOCIAL HISTORY: Social History   Social History  . Marital status: Divorced    Spouse name: N/A  . Number of children: N/A  . Years of education: N/A   Occupational History  . Not on file.   Social History Main Topics  . Smoking status: Never Smoker  . Smokeless tobacco: Never Used  . Alcohol use No  . Drug use: No  . Sexual activity: Not on file   Other Topics Concern  . Not on file   Social History Narrative  . No narrative on file  Nonsmoker no issues with alcohol use or drug use. 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   FAMILY HISTORY:  Notes her mother had ovarian cancer followed by leukemia and died at age 34 years Maternal aunt gastric cancer  ALLERGIES:  is allergic to sulfa antibiotics.  MEDICATIONS:  Current Outpatient Prescriptions  Medication Sig Dispense Refill  . allopurinol (ZYLOPRIM) 300 MG tablet Take 1 tablet (300 mg total) by mouth daily. 30 tablet 3  . HYDROcodone-acetaminophen (NORCO) 5-325 MG tablet Take 1-2 tablets by mouth every 6 (six) hours as needed for moderate pain or severe pain.  30 tablet 0  . lidocaine-prilocaine (EMLA) cream Apply to affected area once 30 g 3  . naproxen (NAPROSYN) 500 MG tablet Take 1 tablet (500 mg total) by mouth 2 (two) times daily with a meal. For 3 days then 1 tab po BID as needed 20 tablet 0  .  ondansetron (ZOFRAN) 8 MG tablet Take 1 tablet (8 mg total) by mouth 2 (two) times daily as needed for refractory nausea / vomiting. Start on day 3 after cyclophosphamide. 30 tablet 1  . predniSONE (DELTASONE) 20 MG tablet Take 3 tablets (60 mg total) by mouth daily. Take on days 1-5 of chemotherapy. 15 tablet 6  . prochlorperazine (COMPAZINE) 10 MG tablet Take 1 tablet (10 mg total) by mouth every 6 (six) hours as needed (Nausea or vomiting). 30 tablet 6  . senna-docusate (SENNA S) 8.6-50 MG tablet Take 2 tablets by mouth at bedtime as needed for mild constipation or moderate constipation. 60 tablet 1  . tiZANidine (ZANAFLEX) 2 MG tablet Take 2 tablets (4 mg total) by mouth every 8 (eight) hours as needed for muscle spasms. 30 tablet 0   No current facility-administered medications for this visit.     REVIEW OF SYSTEMS:    10 Point review of Systems was done is negative except as noted above.  PHYSICAL EXAMINATION: ECOG PERFORMANCE STATUS: 1 - Symptomatic but completely ambulatory  . Vitals:   01/14/17 1017  BP: (!) 143/87  Pulse: 87  Resp: 16  Temp: 98.4 F (36.9 C)   Filed Weights   01/14/17 1017  Weight: 193 lb 8 oz (87.8 kg)   .Body mass index is 35.39 kg/m.  GENERAL:alert, in no acute distress and comfortable SKIN: skin color, texture, turgor are normal, no rashes or significant lesions EYES: normal, conjunctiva are pink and non-injected, sclera clear OROPHARYNX:no exudate, no erythema and lips, buccal mucosa, and tongue normal  NECK: supple, no JVD, thyroid normal size, non-tender, without nodularity LYMPH: palpable left supraclavicular and left axillary lymph node. Smaller right axillary lymph node. LUNGS: clear to auscultation with normal respiratory effort HEART: regular rate & rhythm,  no murmurs and no lower extremity edema ABDOMEN: abdomen soft, non-tender, normoactive bowel sounds , mild tenderness to palpation in left upper abdomen. Musculoskeletal: no cyanosis  of digits and no clubbing  PSYCH: alert & oriented x 3 with fluent speech NEURO: no focal motor/sensory deficits  LABORATORY DATA:  I have reviewed the data as listed  . CBC Latest Ref Rng & Units 01/14/2017 01/01/2017 12/20/2016  WBC 3.9 - 10.3 10e3/uL 6.2 3.5(L) 5.0  Hemoglobin 11.6 - 15.9 g/dL 11.9 12.1 12.1  Hematocrit 34.8 - 46.6 % 34.7(L) 35.0 35.8(L)  Platelets 145 - 400 10e3/uL 315 170 227   CBC    Component Value Date/Time   WBC 6.2 01/14/2017 0937   WBC 5.0 12/20/2016 1141   RBC 4.06 01/14/2017 0937   RBC 4.16 12/20/2016 1141   HGB 11.9 01/14/2017 0937   HCT 34.7 (L) 01/14/2017 0937   PLT 315 01/14/2017 0937   MCV 85.5 01/14/2017 0937   MCH 29.4 01/14/2017 0937   MCH 29.1 12/20/2016 1141   MCHC 34.4 01/14/2017 0937   MCHC 33.8 12/20/2016 1141   RDW 14.5 01/14/2017 0937   LYMPHSABS 1.3 01/14/2017 0937   MONOABS 0.5 01/14/2017 0937   EOSABS 0.1 01/14/2017 0937   BASOSABS 0.1 01/14/2017 0937   . CMP Latest Ref Rng & Units 01/14/2017 01/01/2017 12/24/2016  Glucose 70 - 140 mg/dl 167(H) 102 115  BUN 7.0 - 26.0 mg/dL 11.3 12.8 14.2  Creatinine 0.6 - 1.1 mg/dL 0.7 0.7 0.7  Sodium 136 - 145 mEq/L 141 137 140  Potassium 3.5 - 5.1 mEq/L 3.9 3.8 4.1  CO2 22 - 29 mEq/L 25 20(L) 23  Calcium 8.4 - 10.4 mg/dL 9.5 9.9 9.5  Total Protein 6.4 - 8.3 g/dL 7.3 7.7 7.7  Total Bilirubin 0.20 - 1.20 mg/dL 0.33 0.54 0.35  Alkaline Phos 40 - 150 U/L 81 88 77  AST 5 - 34 U/L 19 16 21   ALT 0 - 55 U/L 18 17 13    . Lab Results  Component Value Date   LDH 210 11/12/2016   Component     Latest Ref Rng & Units 11/12/2016  Hep C Virus Ab     0.0 - 0.9 s/co ratio <0.1  Hepatitis B Surface Ag     Negative Negative  Hep B Core Ab, Tot     Negative Negative       *[place]*                  *[facility] [employer].                        [place], [place] [address]                             [phone]  ------------------------------------------------------------------- Transthoracic Echocardiography  Patient:    Katelyn Reeves, Katelyn Reeves MR #:       [id number] Study Date: 11/19/2016 Gender:     F Age:        89 Height:     160 cm Weight:     90.2 kg BSA:        2.04 m^2 Pt. Status: Room:   SONOGRAPHER  Tresa Res, RDCS  PERFORMING   [facility], Outpatient  ATTENDING    Roselee Nova     [place], [place] Kishore  REFERRING    [place], [place] Kishore  cc:  ------------------------------------------------------------------- LV EF: 60% -   65%  ------------------------------------------------------------------- Indications:      V58.11 Chemotherapy Evaluation.  ------------------------------------------------------------------- History:   Risk factors:  Lymphoma. Obese.  ------------------------------------------------------------------- Study Conclusions  - Left ventricle: The cavity size was normal. There was mild   concentric hypertrophy. Systolic function was normal. The   estimated ejection fraction was in the range of 60% to 65%. Wall   motion was normal; there were no regional wall motion   abnormalities. Doppler parameters are consistent with abnormal   left ventricular relaxation (grade 1 diastolic dysfunction).   Doppler parameters are consistent with indeterminate ventricular   filling pressure. - Aortic valve: Transvalvular velocity was within the normal range.   There was no stenosis. There was trivial regurgitation. - Mitral valve: Transvalvular velocity was within the normal range.   There was no evidence for stenosis. There was trivial   regurgitation. - Right ventricle: The cavity size was normal. Wall thickness was   normal. Systolic function was normal. - Atrial septum: No defect or patent foramen ovale was identified   by color flow Doppler. - Tricuspid valve: There was no regurgitation. - Global longitudinal strain  -17.6%   RADIOGRAPHIC STUDIES: I have personally reviewed the radiological images as  listed and agreed with the findings in the report. Dg Lumbar Spine 2-3 Views  Result Date: 01/01/2017 CLINICAL DATA:  Patient suddenly felt low back and tailbone pain this am; no pain down either leg; no injury EXAM: LUMBAR SPINE - 2-3 VIEW COMPARISON:  None. FINDINGS: Normal alignment of lumbar vertebral bodies. No loss of vertebral body height or disc height. No pars fracture. No subluxation. IMPRESSION: No acute osseous abnormality. Electronically Signed   By: Suzy Bouchard M.D.   On: 01/01/2017 16:04   Dg Sacrum/coccyx  Result Date: 01/01/2017 CLINICAL DATA:  Patient suddenly felt low back and tailbone pain this am; no pain down either leg; no injury EXAM: SACRUM AND COCCYX - 2+ VIEW COMPARISON:  None. FINDINGS: No fracture of the sacrum or coccyx identified. Degenerate spurring of lumbar spine. IMPRESSION: No acute osseous abnormality. Electronically Signed   By: Suzy Bouchard M.D.   On: 01/01/2017 16:05   Ir US Guide Vasc Access Right  Result Date: 12/20/2016 INDICATION: 66 year old with grade 3 follicular lymphoma of lymph nodes of multiple regions. EXAM: FLUOROSCOPIC AND ULTRASOUND GUIDED PLACEMENT OF A SUBCUTANEOUS PORT COMPARISON:  None. MEDICATIONS: Ancef 2 g; The antibiotic was administered within an appropriate time interval prior to skin puncture. ANESTHESIA/SEDATION: Versed 6.0 mg IV; Fentanyl 100 mcg IV; Moderate Sedation Time:  43 minutes The patient was continuously monitored during the procedure by the interventional radiology nurse under my direct supervision. FLUOROSCOPY TIME:  48 seconds, 8 mGy COMPLICATIONS: None immediate. PROCEDURE: The procedure, risks, benefits, and alternatives were explained to the patient. Questions regarding the procedure were encouraged and answered. The patient understands and consents to the procedure. Patient was placed supine on the interventional table.  Ultrasound confirmed a patent right internal jugular vein. The right chest and neck were cleaned with a skin antiseptic and a sterile drape was placed. Maximal barrier sterile technique was utilized including caps, mask, sterile gowns, sterile gloves, sterile drape, hand hygiene and skin antiseptic. The right neck was anesthetized with 1% lidocaine. Small incision was made in the right neck with a blade. Micropuncture set was placed in the right internal jugular vein with ultrasound guidance. The micropuncture wire was used for measurement purposes. The right chest was anesthetized with 1% lidocaine with epinephrine. #15 blade was used to make an incision and a subcutaneous port pocket was formed. [place] was assembled. Subcutaneous tunnel was formed with a stiff tunneling device. The port catheter was brought through the subcutaneous tunnel. The port was placed in the subcutaneous pocket and sutured in place. The micropuncture set was exchanged for a peel-away sheath. The catheter was placed through the peel-away sheath and the tip was positioned in the lower SVC. Catheter placement was confirmed with fluoroscopy. The port was accessed and flushed with heparinized saline. The port pocket was closed using two layers of absorbable sutures and skin glue. The vein skin site was closed using a single layer of absorbable suture and skin glue. Sterile dressings were applied. Patient tolerated the procedure well without an immediate complication. Ultrasound and fluoroscopic images were taken and saved for this procedure. IMPRESSION: Placement of a subcutaneous port device. The catheter tip is in lower SVC and ready to be used. Electronically Signed   By: Markus Daft M.D.   On: 12/20/2016 17:37   Ir Fluoro Guide Port Insertion Right  Result Date: 12/20/2016 INDICATION: 66 year old with grade 3 follicular lymphoma of lymph nodes of multiple regions. EXAM: FLUOROSCOPIC AND ULTRASOUND GUIDED PLACEMENT OF A  SUBCUTANEOUS PORT COMPARISON:  None. MEDICATIONS: Ancef 2 g; The antibiotic was administered within an appropriate time interval prior to skin puncture. ANESTHESIA/SEDATION: Versed 6.0 mg IV; Fentanyl 100 mcg IV; Moderate Sedation Time:  43 minutes The patient was continuously monitored during the procedure by the interventional radiology nurse under my direct supervision. FLUOROSCOPY TIME:  48 seconds, 8 mGy COMPLICATIONS: None immediate. PROCEDURE: The procedure, risks, benefits, and alternatives were explained to the patient. Questions regarding the procedure were encouraged and answered. The patient understands and consents to the procedure. Patient was placed supine on the interventional table. Ultrasound confirmed a patent right internal jugular vein. The right chest and neck were cleaned with a skin antiseptic and a sterile drape was placed. Maximal barrier sterile technique was utilized including caps, mask, sterile gowns, sterile gloves, sterile drape, hand hygiene and skin antiseptic. The right neck was anesthetized with 1% lidocaine. Small incision was made in the right neck with a blade. Micropuncture set was placed in the right internal jugular vein with ultrasound guidance. The micropuncture wire was used for measurement purposes. The right chest was anesthetized with 1% lidocaine with epinephrine. #15 blade was used to make an incision and a subcutaneous port pocket was formed. [place] was assembled. Subcutaneous tunnel was formed with a stiff tunneling device. The port catheter was brought through the subcutaneous tunnel. The port was placed in the subcutaneous pocket and sutured in place. The micropuncture set was exchanged for a peel-away sheath. The catheter was placed through the peel-away sheath and the tip was positioned in the lower SVC. Catheter placement was confirmed with fluoroscopy. The port was accessed and flushed with heparinized saline. The port pocket was closed using two  layers of absorbable sutures and skin glue. The vein skin site was closed using a single layer of absorbable suture and skin glue. Sterile dressings were applied. Patient tolerated the procedure well without an immediate complication. Ultrasound and fluoroscopic images were taken and saved for this procedure. IMPRESSION: Placement of a subcutaneous port device. The catheter tip is in lower SVC and ready to be used. Electronically Signed   By: Markus Daft M.D.   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 Also noted to have left supraclavicular and bilateral axillary left more than right FDG avid lymphadenopathy. Based on imaging this would represent at least Stage III disease if this were a lymphoma. LDH level is not significantly elevated. Blood counts are stable. PET/CT scan does show fairly active disease which is FDG avid with FDG avid splenomegaly. Patient has some constitutional symptoms with about a 10 pound weight loss and some night sweats. Hepatitis profile negative. No other obvious new focal symptoms.  Patient has had an ECHO which shows nl EF  Patient is status post first cycle of R CHOP with no prohibitive toxicities.  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 Most of the symptoms have resolved between treatment cycles. No indications for dose reduction at this time. Plan - Patient's labs are stable today with no overt prohibitive treatment toxicities. -We shall proceed with her second planned cycle of R CHOP chemotherapy with Neulasta support. --we will plan to rpt PET/CT after 3 cycles of rx and plan  to treat with 6 cycles of R-CHOP if tolerated.  #3 severe bilateral back spasms ? Flare of  osteoarthritis.  Patient had lumbar and lumbosacral x-rays which showed no new osseous lesions. Patient notes no further back pain or back spasms at this time and has not been any pain medications for this. This has resolved.Marland Kitchen    RTC with Dr Irene Limbo in 3 weeks with labs C3D1 Continue R-CHOP as per schedule  All of the patients questions were answered with apparent satisfaction. The patient knows to call the clinic with any problems, questions or concerns.  I spent 25 minutes counseling the patient face to face. The total time spent in the appointment was 30 minutes and more than 50% was on counseling and direct patient cares.    Sullivan Lone MD [place] AAHIVMS [facility] Hematology/Oncology Physician [facility]  (Office):       [phone] (Work cell):  [phone] (Fax):           [phone]

## 2017-01-14 NOTE — Progress Notes (Signed)
Blood return noted before, every 5ms, after Adriamycin push and before and after Vincristine.   Neulasta instructions reviewed with patient. Patient verbalized understanding.

## 2017-01-14 NOTE — Progress Notes (Signed)
[place] Work  Holiday representative was referred by Therapist, sports for resources and support.  CSw met with patient in the infusion room at [facility] to offer support and assess for needs.  Patient expressed concerns for telling her grandson about her diagnosis.  CSW and patient processed her concerns and discussed positive ways to talk with her grandson about her diagnosis.  CSW also provided patient with handouts on talking with children when a loved one has been diagnosed with cancer.  CSw also provided information on [facility] as an additional counseling resource.  Patient had multiple questions and concerns regarding work and FMLA vs ADA.  CSW and patient discussed multiple options.  CSW encouraged patient to meet with her HR department to determine which avenue would be in the patients best interest.  Patient stated she felt "better" having more information on "what questions to ask" when she meets with HR.  CSW provided contact information and encouraged patient to call with questions or concerns.    Johnnye Lana, MSW, LCSW, OSW-C Clinical Social Worker [facility] [phone]

## 2017-01-14 NOTE — Patient Instructions (Signed)
Thank you for choosing [facility] to provide your oncology and hematology care.  To afford each patient quality time with our providers, please arrive 30 minutes before your scheduled appointment time.  If you arrive late for your appointment, you may be asked to reschedule.  We strive to give you quality time with our providers, and arriving late affects you and other patients whose appointments are after yours.  If you are a no show for multiple scheduled visits, you may be dismissed from the clinic at the providers discretion.   Again, thank you for choosing [facility], our hope is that these requests will decrease the amount of time that you wait before being seen by our physicians.  ______________________________________________________________________ Should you have questions after your visit to the [facility], please contact our office at [phone] between the hours of 8:30 and 4:30 p.m.    Voicemails left after 4:30p.m will not be returned until the following business day.   For prescription refill requests, please have your pharmacy contact us directly.  Please also try to allow 48 hours for prescription requests.   Please contact the scheduling department for questions regarding scheduling.  For scheduling of procedures such as PET scans, CT scans, MRI, Ultrasound, etc please contact central scheduling at [phone].   Resources For Cancer Patients and Caregivers:  American Cancer Society:  800-227-2345  Can help patients locate various types of support and financial assistance Cancer Care: 1-800-813-HOPE (4673) Provides financial assistance, online support groups, medication/co-pay assistance.   [place] DSS:  336-641-3447 Where to apply for food stamps, Medicaid, and utility assistance Medicare Rights Center: 800-333-4114 Helps people with Medicare understand their rights and benefits, navigate the Medicare system, and secure the  quality healthcare they deserve SCAT: 336-333-6589 [place] [employer]'s shared-ride transportation service for eligible riders who have a disability that prevents them from riding the fixed route bus.   For additional information on assistance programs please contact our social worker:   Grier Hock/Abigail Elmore:  [phone] 

## 2017-01-14 NOTE — Patient Instructions (Signed)
[place] Discharge Instructions for Patients Receiving Chemotherapy  Today you received the following chemotherapy agents: Adriamycin, Vincristine, Cytoxan and Rituxan   To help prevent nausea and vomiting after your treatment, we encourage you to take your nausea medication as directed.    If you develop nausea and vomiting that is not controlled by your nausea medication, call the clinic.   BELOW ARE SYMPTOMS THAT SHOULD BE REPORTED IMMEDIATELY:  *FEVER GREATER THAN 100.5 F  *CHILLS WITH OR WITHOUT FEVER  NAUSEA AND VOMITING THAT IS NOT CONTROLLED WITH YOUR NAUSEA MEDICATION  *UNUSUAL SHORTNESS OF BREATH  *UNUSUAL BRUISING OR BLEEDING  TENDERNESS IN MOUTH AND THROAT WITH OR WITHOUT PRESENCE OF ULCERS  *URINARY PROBLEMS  *BOWEL PROBLEMS  UNUSUAL RASH Items with * indicate a potential emergency and should be followed up as soon as possible.  Feel free to call the clinic you have any questions or concerns. The clinic phone number is [phone].  Please show the [place] at check-in to the Emergency Department and triage nurse.

## 2017-01-15 ENCOUNTER — Telehealth: Payer: Self-pay | Admitting: *Deleted

## 2017-01-15 ENCOUNTER — Telehealth: Payer: Self-pay | Admitting: Hematology

## 2017-01-15 NOTE — Telephone Encounter (Signed)
Per Dr. Irene Limbo, called pt to instruct her to discontinue allopurinol.  Pt verbalized understanding/thankful for call.

## 2017-01-15 NOTE — Telephone Encounter (Signed)
Scheduled appt per 4/24 los. Patient is aware of appt date and time.

## 2017-01-16 ENCOUNTER — Ambulatory Visit: Payer: BLUE CROSS/BLUE SHIELD

## 2017-01-21 ENCOUNTER — Telehealth: Payer: Self-pay | Admitting: Hematology

## 2017-01-21 NOTE — Telephone Encounter (Signed)
Completed [employer] forms for patient and scanned into Epic under media tab. called to advise that they are ready for pickup at the front registration. Patient states that she will be in to pick them up in about a week. Also stated that she will be faxing FMLA forms over as well. Gave patient the fax number

## 2017-01-22 ENCOUNTER — Encounter: Payer: Self-pay | Admitting: Hematology

## 2017-02-04 ENCOUNTER — Other Ambulatory Visit (HOSPITAL_BASED_OUTPATIENT_CLINIC_OR_DEPARTMENT_OTHER): Payer: BLUE CROSS/BLUE SHIELD

## 2017-02-04 ENCOUNTER — Ambulatory Visit: Payer: BLUE CROSS/BLUE SHIELD

## 2017-02-04 ENCOUNTER — Other Ambulatory Visit: Payer: BLUE CROSS/BLUE SHIELD

## 2017-02-04 ENCOUNTER — Encounter: Payer: Self-pay | Admitting: Hematology

## 2017-02-04 ENCOUNTER — Ambulatory Visit (HOSPITAL_BASED_OUTPATIENT_CLINIC_OR_DEPARTMENT_OTHER): Payer: BLUE CROSS/BLUE SHIELD | Admitting: Hematology

## 2017-02-04 ENCOUNTER — Ambulatory Visit (HOSPITAL_BASED_OUTPATIENT_CLINIC_OR_DEPARTMENT_OTHER): Payer: BLUE CROSS/BLUE SHIELD

## 2017-02-04 VITALS — BP 154/71 | HR 78 | Temp 98.7°F | Resp 18 | Ht 62.0 in | Wt 194.3 lb

## 2017-02-04 VITALS — BP 124/80 | HR 95 | Temp 99.0°F | Resp 17

## 2017-02-04 DIAGNOSIS — Z5111 Encounter for antineoplastic chemotherapy: Secondary | ICD-10-CM | POA: Diagnosis not present

## 2017-02-04 DIAGNOSIS — R252 Cramp and spasm: Secondary | ICD-10-CM | POA: Diagnosis not present

## 2017-02-04 DIAGNOSIS — C8248 Follicular lymphoma grade IIIb, lymph nodes of multiple sites: Secondary | ICD-10-CM

## 2017-02-04 DIAGNOSIS — Z5112 Encounter for antineoplastic immunotherapy: Secondary | ICD-10-CM

## 2017-02-04 DIAGNOSIS — G629 Polyneuropathy, unspecified: Secondary | ICD-10-CM | POA: Diagnosis not present

## 2017-02-04 LAB — CBC & DIFF AND RETIC
BASO%: 1.2 % (ref 0.0–2.0)
Basophils Absolute: 0.1 10*3/uL (ref 0.0–0.1)
EOS%: 1.2 % (ref 0.0–7.0)
Eosinophils Absolute: 0.1 10*3/uL (ref 0.0–0.5)
HCT: 33.1 % — ABNORMAL LOW (ref 34.8–46.6)
HGB: 11 g/dL — ABNORMAL LOW (ref 11.6–15.9)
Immature Retic Fract: 6.8 % (ref 1.60–10.00)
LYMPH%: 11.8 % — ABNORMAL LOW (ref 14.0–49.7)
MCH: 29.4 pg (ref 25.1–34.0)
MCHC: 33.2 g/dL (ref 31.5–36.0)
MCV: 88.5 fL (ref 79.5–101.0)
MONO#: 0.2 10*3/uL (ref 0.1–0.9)
MONO%: 2.8 % (ref 0.0–14.0)
NEUT%: 83 % — ABNORMAL HIGH (ref 38.4–76.8)
NEUTROS ABS: 4.7 10*3/uL (ref 1.5–6.5)
Platelets: 264 10*3/uL (ref 145–400)
RBC: 3.74 10*6/uL (ref 3.70–5.45)
RDW: 16.2 % — AB (ref 11.2–14.5)
RETIC %: 2.24 % — AB (ref 0.70–2.10)
Retic Ct Abs: 83.78 10*3/uL (ref 33.70–90.70)
WBC: 5.7 10*3/uL (ref 3.9–10.3)
lymph#: 0.7 10*3/uL — ABNORMAL LOW (ref 0.9–3.3)

## 2017-02-04 LAB — COMPREHENSIVE METABOLIC PANEL
ALT: 16 U/L (ref 0–55)
AST: 17 U/L (ref 5–34)
Albumin: 3.9 g/dL (ref 3.5–5.0)
Alkaline Phosphatase: 73 U/L (ref 40–150)
Anion Gap: 11 mEq/L (ref 3–11)
BUN: 11 mg/dL (ref 7.0–26.0)
CALCIUM: 9.6 mg/dL (ref 8.4–10.4)
CHLORIDE: 106 meq/L (ref 98–109)
CO2: 25 meq/L (ref 22–29)
Creatinine: 0.7 mg/dL (ref 0.6–1.1)
EGFR: 85 mL/min/{1.73_m2} — ABNORMAL LOW (ref >90)
GLUCOSE: 161 mg/dL — AB (ref 70–140)
POTASSIUM: 4.1 meq/L (ref 3.5–5.1)
SODIUM: 141 meq/L (ref 136–145)
Total Bilirubin: 0.37 mg/dL (ref 0.20–1.20)
Total Protein: 7.4 g/dL (ref 6.4–8.3)

## 2017-02-04 MED ORDER — DOXORUBICIN HCL CHEMO IV INJECTION 2 MG/ML
50.0000 mg/m2 | Freq: Once | INTRAVENOUS | Status: AC
  Administered 2017-02-04: 98 mg via INTRAVENOUS
  Filled 2017-02-04: qty 49

## 2017-02-04 MED ORDER — PEGFILGRASTIM 6 MG/0.6ML ~~LOC~~ PSKT
6.0000 mg | PREFILLED_SYRINGE | Freq: Once | SUBCUTANEOUS | Status: AC
  Administered 2017-02-04: 6 mg via SUBCUTANEOUS
  Filled 2017-02-04: qty 0.6

## 2017-02-04 MED ORDER — ACETAMINOPHEN 325 MG PO TABS
ORAL_TABLET | ORAL | Status: AC
Start: 2017-02-04 — End: 2017-02-04
  Filled 2017-02-04: qty 2

## 2017-02-04 MED ORDER — PALONOSETRON HCL INJECTION 0.25 MG/5ML
INTRAVENOUS | Status: AC
  Filled 2017-02-04: qty 5

## 2017-02-04 MED ORDER — PALONOSETRON HCL INJECTION 0.25 MG/5ML
0.2500 mg | Freq: Once | INTRAVENOUS | Status: AC
  Administered 2017-02-04: 0.25 mg via INTRAVENOUS

## 2017-02-04 MED ORDER — DIPHENHYDRAMINE HCL 25 MG PO CAPS
ORAL_CAPSULE | ORAL | Status: AC
  Filled 2017-02-04: qty 2

## 2017-02-04 MED ORDER — VINCRISTINE SULFATE CHEMO INJECTION 1 MG/ML
2.0000 mg | Freq: Once | INTRAVENOUS | Status: AC
  Administered 2017-02-04: 2 mg via INTRAVENOUS
  Filled 2017-02-04: qty 2

## 2017-02-04 MED ORDER — DIPHENHYDRAMINE HCL 25 MG PO CAPS
50.0000 mg | ORAL_CAPSULE | Freq: Once | ORAL | Status: AC
  Administered 2017-02-04: 50 mg via ORAL

## 2017-02-04 MED ORDER — DEXAMETHASONE SODIUM PHOSPHATE 10 MG/ML IJ SOLN
10.0000 mg | Freq: Once | INTRAMUSCULAR | Status: AC
  Administered 2017-02-04: 10 mg via INTRAVENOUS

## 2017-02-04 MED ORDER — SODIUM CHLORIDE 0.9 % IV SOLN
Freq: Once | INTRAVENOUS | Status: AC
  Administered 2017-02-04: 11:00:00 via INTRAVENOUS

## 2017-02-04 MED ORDER — SODIUM CHLORIDE 0.9% FLUSH
10.0000 mL | INTRAVENOUS | Status: DC | PRN
  Administered 2017-02-04: 10 mL
  Filled 2017-02-04: qty 10

## 2017-02-04 MED ORDER — SODIUM CHLORIDE 0.9% FLUSH
10.0000 mL | INTRAVENOUS | Status: DC | PRN
Start: 2017-02-04 — End: 2017-02-04
  Administered 2017-02-04: 10 mL via INTRAVENOUS
  Filled 2017-02-04: qty 10

## 2017-02-04 MED ORDER — SODIUM CHLORIDE 0.9 % IV SOLN
375.0000 mg/m2 | Freq: Once | INTRAVENOUS | Status: AC
  Administered 2017-02-04: 700 mg via INTRAVENOUS
  Filled 2017-02-04: qty 50

## 2017-02-04 MED ORDER — ACETAMINOPHEN 325 MG PO TABS
650.0000 mg | ORAL_TABLET | Freq: Once | ORAL | Status: AC
  Administered 2017-02-04: 650 mg via ORAL

## 2017-02-04 MED ORDER — DEXAMETHASONE SODIUM PHOSPHATE 10 MG/ML IJ SOLN
INTRAMUSCULAR | Status: AC
  Filled 2017-02-04: qty 1

## 2017-02-04 MED ORDER — CYCLOPHOSPHAMIDE CHEMO INJECTION 1 GM
750.0000 mg/m2 | Freq: Once | INTRAMUSCULAR | Status: AC
  Administered 2017-02-04: 1480 mg via INTRAVENOUS
  Filled 2017-02-04: qty 74

## 2017-02-04 MED ORDER — HEPARIN SOD (PORK) LOCK FLUSH 100 UNIT/ML IV SOLN
500.0000 [IU] | Freq: Once | INTRAVENOUS | Status: AC | PRN
  Administered 2017-02-04: 500 [IU]
  Filled 2017-02-04: qty 5

## 2017-02-04 NOTE — Progress Notes (Signed)
Marland Kitchen    HEMATOLOGY/ONCOLOGY CLINIC NOTE  Date of Service: .02/04/2017  Patient Care Team: Asencion Noble, MD as PCP - General (Internal Medicine)  CHIEF COMPLAINTS/PURPOSE OF CONSULTATION:   f/u for high grade follicular lymphoma  HISTORY OF PRESENTING ILLNESS:   Katelyn Reeves is a wonderful 66 y.o. female who has been referred to [place] by Dr .Asencion Noble, MD for evaluation and management of suspected non-Hodgkin's lymphoma.  Patient notes that she presented with back pain for 4-6 weeks and eventually had pain radiating to her right inguinal area. She notes that she has had interstitial cystitis in the past. She was thought to have a urinary tract infection and was treated with Macrodantin. Says the pain got worse and started radiating into the right groin she had a CT of the abdomen renal stone protocol on 10/25/2016 which showed extensive retroperitoneal lymphadenopathy concerning for lymphoproliferative disorder. She was incidentally noted to have a nodular border of the liver with possible liver cirrhosis. No urinary stones noted.  She subsequently had a PET CT scan ordered by her primary care physician which was done on 11/07/2016. This showed diffuse hypermetabolic spleen with mild splenomegaly. Hypermetabolic adenopathy in the left supraclavicular, bilateral axillary, peripancreatic, gastrohepatic ligament, celiac, porta hepatis, periaortic, and left external iliac chains. Hypermetabolic mesenteric lymph nodes. Appearance favors lymphoma.  Patient notes some night sweats for 2 weeks. Has lost about 10 pounds in the last month. No acute fevers or chills. Notes the back pain is controlled with when necessary Vicodin.  INTERVAL HISTORY  Katelyn Reeves is here for her scheduled follow-up prior to her 3rd cycle of R CHOP chemotherapy. She notes no acute new issues. Feels well. No nausea no vomiting. Minimal tingling in her fingers likely related to vincristine which has resolved currently. She notes  that most of the symptoms have resolved.  No further back pains. No fevers no chills no night sweats. She notes that she is working mostly from home at this time.   MEDICAL HISTORY:  #1 fibrocystic disease of the breast #2 hypertension #3 constipation #4 duplicated right sided ureters #5 imaging concern for possible liver cirrhosis  SURGICAL HISTORY:  #1 total abdominal hysterectomy with bilateral salpingo-oophorectomy for ovarian cyst. #2 Lipoma removal left upper back #3 hemorrhoidectomy  SOCIAL HISTORY: Social History   Social History  . Marital status: Divorced    Spouse name: N/A  . Number of children: N/A  . Years of education: N/A   Occupational History  . Not on file.   Social History Main Topics  . Smoking status: Never Smoker  . Smokeless tobacco: Never Used  . Alcohol use No  . Drug use: No  . Sexual activity: Not on file   Other Topics Concern  . Not on file   Social History Narrative  . No narrative on file  Nonsmoker no issues with alcohol use or drug use. 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   FAMILY HISTORY:  Notes her mother had ovarian cancer followed by leukemia and died at age 36 years Maternal aunt gastric cancer  ALLERGIES:  is allergic to sulfa antibiotics.  MEDICATIONS:  Current Outpatient Prescriptions  Medication Sig Dispense Refill  . HYDROcodone-acetaminophen (NORCO) 5-325 MG tablet Take 1-2 tablets by mouth every 6 (six) hours as needed for moderate pain or severe pain. 30 tablet 0  . lidocaine-prilocaine (EMLA) cream Apply to affected area once 30 g 3  . naproxen (NAPROSYN) 500 MG tablet Take 1  tablet (500 mg total) by mouth 2 (two) times daily with a meal. For 3 days then 1 tab po BID as needed 20 tablet 0  . ondansetron (ZOFRAN) 8 MG tablet Take 1 tablet (8 mg total) by mouth 2 (two) times daily as needed for refractory nausea / vomiting. Start on day 3 after cyclophosphamide. 30  tablet 1  . predniSONE (DELTASONE) 20 MG tablet Take 3 tablets (60 mg total) by mouth daily. Take on days 1-5 of chemotherapy. 15 tablet 6  . prochlorperazine (COMPAZINE) 10 MG tablet Take 1 tablet (10 mg total) by mouth every 6 (six) hours as needed (Nausea or vomiting). 30 tablet 6  . senna-docusate (SENNA S) 8.6-50 MG tablet Take 2 tablets by mouth at bedtime as needed for mild constipation or moderate constipation. 60 tablet 1  . tiZANidine (ZANAFLEX) 2 MG tablet Take 2 tablets (4 mg total) by mouth every 8 (eight) hours as needed for muscle spasms. 30 tablet 0   No current facility-administered medications for this visit.    Facility-Administered Medications Ordered in Other Visits  Medication Dose Route Frequency Provider Last Rate Last Dose  . cyclophosphamide (CYTOXAN) 1,480 mg in sodium chloride 0.9 % 250 mL chemo infusion  750 mg/m2 (Treatment Plan Recorded) Intravenous Once Brunetta Genera, MD 648 mL/hr at 02/04/17 1208 1,480 mg at 02/04/17 1208  . heparin lock flush 100 unit/mL  500 Units Intracatheter Once PRN Brunetta Genera, MD      . pegfilgrastim (NEULASTA ONPRO KIT) injection 6 mg  6 mg Subcutaneous Once Brunetta Genera, MD      . riTUXimab (RITUXAN) 700 mg in sodium chloride 0.9 % 180 mL chemo infusion  375 mg/m2 (Treatment Plan Recorded) Intravenous Once Brunetta Genera, MD      . sodium chloride flush (NS) 0.9 % injection 10 mL  10 mL Intracatheter PRN Brunetta Genera, MD        REVIEW OF SYSTEMS:    10 Point review of Systems was done is negative except as noted above.  PHYSICAL EXAMINATION: ECOG PERFORMANCE STATUS: 1 - Symptomatic but completely ambulatory  . Vitals:   02/04/17 0913  BP: (!) 154/71  Pulse: 78  Resp: 18  Temp: 98.7 F (37.1 C)   Filed Weights   02/04/17 0913  Weight: 194 lb 4.8 oz (88.1 kg)   .Body mass index is 35.54 kg/m.  GENERAL:alert, in no acute distress and comfortable SKIN: skin color, texture, turgor are  normal, no rashes or significant lesions EYES: normal, conjunctiva are pink and non-injected, sclera clear OROPHARYNX:no exudate, no erythema and lips, buccal mucosa, and tongue normal  NECK: supple, no JVD, thyroid normal size, non-tender, without nodularity LYMPH: palpable left supraclavicular and left axillary lymph node. Smaller right axillary lymph node. LUNGS: clear to auscultation with normal respiratory effort HEART: regular rate & rhythm,  no murmurs and no lower extremity edema ABDOMEN: abdomen soft, non-tender, normoactive bowel sounds , mild tenderness to palpation in left upper abdomen. Musculoskeletal: no cyanosis of digits and no clubbing  PSYCH: alert & oriented x 3 with fluent speech NEURO: no focal motor/sensory deficits  LABORATORY DATA:  I have reviewed the data as listed  . CBC Latest Ref Rng & Units 02/04/2017 01/14/2017 01/01/2017  WBC 3.9 - 10.3 10e3/uL 5.7 6.2 3.5(L)  Hemoglobin 11.6 - 15.9 g/dL 11.0(L) 11.9 12.1  Hematocrit 34.8 - 46.6 % 33.1(L) 34.7(L) 35.0  Platelets 145 - 400 10e3/uL 264 315 170   CBC  Component Value Date/Time   WBC 5.7 02/04/2017 0752   WBC 5.0 12/20/2016 1141   RBC 3.74 02/04/2017 0752   RBC 4.16 12/20/2016 1141   HGB 11.0 (L) 02/04/2017 0752   HCT 33.1 (L) 02/04/2017 0752   PLT 264 02/04/2017 0752   MCV 88.5 02/04/2017 0752   MCH 29.4 02/04/2017 0752   MCH 29.1 12/20/2016 1141   MCHC 33.2 02/04/2017 0752   MCHC 33.8 12/20/2016 1141   RDW 16.2 (H) 02/04/2017 0752   LYMPHSABS 0.7 (L) 02/04/2017 0752   MONOABS 0.2 02/04/2017 0752   EOSABS 0.1 02/04/2017 0752   BASOSABS 0.1 02/04/2017 0752   . CMP Latest Ref Rng & Units 02/04/2017 01/14/2017 01/01/2017  Glucose 70 - 140 mg/dl 161(H) 167(H) 102  BUN 7.0 - 26.0 mg/dL 11.0 11.3 12.8  Creatinine 0.6 - 1.1 mg/dL 0.7 0.7 0.7  Sodium 136 - 145 mEq/L 141 141 137  Potassium 3.5 - 5.1 mEq/L 4.1 3.9 3.8  CO2 22 - 29 mEq/L 25 25 20(L)  Calcium 8.4 - 10.4 mg/dL 9.6 9.5 9.9  Total  Protein 6.4 - 8.3 g/dL 7.4 7.3 7.7  Total Bilirubin 0.20 - 1.20 mg/dL 0.37 0.33 0.54  Alkaline Phos 40 - 150 U/L 73 81 88  AST 5 - 34 U/L 17 19 16   ALT 0 - 55 U/L 16 18 17    . Lab Results  Component Value Date   LDH 210 11/12/2016   Component     Latest Ref Rng & Units 11/12/2016  Hep C Virus Ab     0.0 - 0.9 s/co ratio <0.1  Hepatitis B Surface Ag     Negative Negative  Hep B Core Ab, Tot     Negative Negative       *[facility]*                  *[facility] [employer]                        [place], [place] [address]                            [phone]  ------------------------------------------------------------------- Transthoracic Echocardiography  Patient:    Katelyn, Reeves MR #:       [id number] Study Date: 11/19/2016 Gender:     F Age:        42 Height:     160 cm Weight:     90.2 kg BSA:        2.04 m^2 Pt. Status: Room:   SONOGRAPHER  Tresa Res, RDCS  PERFORMING   [facility], Outpatient  ATTENDING    Roselee Nova     [place], [place] Kishore  REFERRING    [place], [place] Kishore  cc:  ------------------------------------------------------------------- LV EF: 60% -   65%  ------------------------------------------------------------------- Indications:      V58.11 Chemotherapy Evaluation.  ------------------------------------------------------------------- History:   Risk factors:  Lymphoma. Obese.  ------------------------------------------------------------------- Study Conclusions  - Left ventricle: The cavity size was normal. There was mild   concentric hypertrophy. Systolic function was normal. The   estimated ejection fraction was in the range of 60% to 65%. Wall   motion was normal; there were no regional wall motion   abnormalities. Doppler  parameters are consistent with abnormal   left ventricular relaxation (grade 1 diastolic dysfunction).   Doppler parameters are  consistent with indeterminate ventricular   filling pressure. - Aortic valve: Transvalvular velocity was within the normal range.   There was no stenosis. There was trivial regurgitation. - Mitral valve: Transvalvular velocity was within the normal range.   There was no evidence for stenosis. There was trivial   regurgitation. - Right ventricle: The cavity size was normal. Wall thickness was   normal. Systolic function was normal. - Atrial septum: No defect or patent foramen ovale was identified   by color flow Doppler. - Tricuspid valve: There was no regurgitation. - Global longitudinal strain -17.6%   RADIOGRAPHIC STUDIES: I have personally reviewed the radiological images as listed and agreed with the findings in the report. No results found.  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 Also noted to have left supraclavicular and bilateral axillary left more than right FDG avid lymphadenopathy. Based on imaging this would represent at least Stage III disease if this were a lymphoma. LDH level is not significantly elevated. Blood counts are stable. PET/CT scan does show fairly active disease which is FDG avid with FDG avid splenomegaly. Patient has some constitutional symptoms with about a 10 pound weight loss and some night sweats. Hepatitis profile negative. No other obvious new focal symptoms.  Patient has had an ECHO which shows nl EF  Patient is status post 2 cycles of R CHOP with no prohibitive toxicities.  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 Most of the symptoms have resolved between treatment cycles. No indications for dose reduction at this time. Plan - Patient's labs are stable today with no overt prohibitive  treatment toxicities. -We shall proceed with her 3rd planned cycle of R CHOP chemotherapy with Neulasta support. --PET/CT in 2 weeks to re-evaluate response to treatment and planning further treatment strategy.  #3 severe bilateral back spasms ? Flare of osteoarthritis.  Patient had lumbar and lumbosacral x-rays which showed no new osseous lesions. Patient notes no further back pain or back spasms at this time and has not been any pain medications for this. This has resolved..   continue treatment as per schedule -PET/CT in 2 weeks -RTC with Dr Irene Limbo in 3 weeks with labs C4D1  All of the patients questions were answered with apparent satisfaction. The patient knows to call the clinic with any problems, questions or concerns.  I spent 20 minutes counseling the patient face to face. The total time spent in the appointment was 25 minutes and more than 50% was on counseling and direct patient cares.    Sullivan Lone MD [place] AAHIVMS [facility] Hematology/Oncology Physician [facility]  (Office):       [phone] (Work cell):  [phone] (Fax):           [phone]

## 2017-02-04 NOTE — Patient Instructions (Signed)
Thank you for choosing [facility] to provide your oncology and hematology care.  To afford each patient quality time with our providers, please arrive 30 minutes before your scheduled appointment time.  If you arrive late for your appointment, you may be asked to reschedule.  We strive to give you quality time with our providers, and arriving late affects you and other patients whose appointments are after yours.   If you are a no show for multiple scheduled visits, you may be dismissed from the clinic at the providers discretion.    Again, thank you for choosing [facility], our hope is that these requests will decrease the amount of time that you wait before being seen by our physicians.  ______________________________________________________________________  Should you have questions after your visit to the [facility], please contact our office at [phone] between the hours of 8:30 and 4:30 p.m.    Voicemails left after 4:30p.m will not be returned until the following business day.    For prescription refill requests, please have your pharmacy contact us directly.  Please also try to allow 48 hours for prescription requests.    Please contact the scheduling department for questions regarding scheduling.  For scheduling of procedures such as PET scans, CT scans, MRI, Ultrasound, etc please contact central scheduling at [phone].    Resources For Cancer Patients and Caregivers:   Oncolink.org:  A wonderful resource for patients and healthcare providers for information regarding your disease, ways to tract your treatment, what to expect, etc.     American Cancer Society:  800-227-2345  Can help patients locate various types of support and financial assistance  Cancer Care: 1-800-813-HOPE (4673) Provides financial assistance, online support groups, medication/co-pay assistance.    [place] DSS:  336-641-3447 Where to apply for food  stamps, Medicaid, and utility assistance  Medicare Rights Center: 800-333-4114 Helps people with Medicare understand their rights and benefits, navigate the Medicare system, and secure the quality healthcare they deserve  SCAT: 336-333-6589 [place] [employer]'s shared-ride transportation service for eligible riders who have a disability that prevents them from riding the fixed route bus.    For additional information on assistance programs please contact our social worker:   Grier Hock/Abigail Elmore:  [phone]            

## 2017-02-04 NOTE — Patient Instructions (Signed)
Implanted Port Home Guide An implanted port is a type of central line that is placed under the skin. Central lines are used to provide IV access when treatment or nutrition needs to be given through a person's veins. Implanted ports are used for long-term IV access. An implanted port may be placed because:  You need IV medicine that would be irritating to the small veins in your hands or arms.  You need long-term IV medicines, such as antibiotics.  You need IV nutrition for a long period.  You need frequent blood draws for lab tests.  You need dialysis.  Implanted ports are usually placed in the chest area, but they can also be placed in the upper arm, the abdomen, or the leg. An implanted port has two main parts:  Reservoir. The reservoir is round and will appear as a small, raised area under your skin. The reservoir is the part where a needle is inserted to give medicines or draw blood.  Catheter. The catheter is a thin, flexible tube that extends from the reservoir. The catheter is placed into a large vein. Medicine that is inserted into the reservoir goes into the catheter and then into the vein.  How will I care for my incision site? Do not get the incision site wet. Bathe or shower as directed by your health care provider. How is my port accessed? Special steps must be taken to access the port:  Before the port is accessed, a numbing cream can be placed on the skin. This helps numb the skin over the port site.  Your health care provider uses a sterile technique to access the port. ? Your health care provider must put on a mask and sterile gloves. ? The skin over your port is cleaned carefully with an antiseptic and allowed to dry. ? The port is gently pinched between sterile gloves, and a needle is inserted into the port.  Only "non-coring" port needles should be used to access the port. Once the port is accessed, a blood return should be checked. This helps ensure that the port  is in the vein and is not clogged.  If your port needs to remain accessed for a constant infusion, a clear (transparent) bandage will be placed over the needle site. The bandage and needle will need to be changed every week, or as directed by your health care provider.  Keep the bandage covering the needle clean and dry. Do not get it wet. Follow your health care provider's instructions on how to take a shower or bath while the port is accessed.  If your port does not need to stay accessed, no bandage is needed over the port.  What is flushing? Flushing helps keep the port from getting clogged. Follow your health care provider's instructions on how and when to flush the port. Ports are usually flushed with saline solution or a medicine called heparin. The need for flushing will depend on how the port is used.  If the port is used for intermittent medicines or blood draws, the port will need to be flushed: ? After medicines have been given. ? After blood has been drawn. ? As part of routine maintenance.  If a constant infusion is running, the port may not need to be flushed.  How long will my port stay implanted? The port can stay in for as long as your health care provider thinks it is needed. When it is time for the port to come out, surgery will be   done to remove it. The procedure is similar to the one performed when the port was put in. When should I seek immediate medical care? When you have an implanted port, you should seek immediate medical care if:  You notice a bad smell coming from the incision site.  You have swelling, redness, or drainage at the incision site.  You have more swelling or pain at the port site or the surrounding area.  You have a fever that is not controlled with medicine.  This information is not intended to replace advice given to you by your health care provider. Make sure you discuss any questions you have with your health care provider. Document  Released: 09/09/2005 Document Revised: 02/15/2016 Document Reviewed: 05/17/2013 [employer] Interactive Patient Education  2017 [employer]  

## 2017-02-04 NOTE — Patient Instructions (Signed)
[facility] Discharge Instructions for Patients Receiving Chemotherapy  Today you received the following chemotherapy agents:  Doxorubicin, Vincristine, Cytoxan, Rituxan  To help prevent nausea and vomiting after your treatment, we encourage you to take your nausea medication as prescribed.   If you develop nausea and vomiting that is not controlled by your nausea medication, call the clinic.   BELOW ARE SYMPTOMS THAT SHOULD BE REPORTED IMMEDIATELY:  *FEVER GREATER THAN 100.5 F  *CHILLS WITH OR WITHOUT FEVER  NAUSEA AND VOMITING THAT IS NOT CONTROLLED WITH YOUR NAUSEA MEDICATION  *UNUSUAL SHORTNESS OF BREATH  *UNUSUAL BRUISING OR BLEEDING  TENDERNESS IN MOUTH AND THROAT WITH OR WITHOUT PRESENCE OF ULCERS  *URINARY PROBLEMS  *BOWEL PROBLEMS  UNUSUAL RASH Items with * indicate a potential emergency and should be followed up as soon as possible.  Feel free to call the clinic you have any questions or concerns. The clinic phone number is [phone].  Please show the [place] at check-in to the Emergency Department and triage nurse.

## 2017-02-07 ENCOUNTER — Other Ambulatory Visit: Payer: Self-pay | Admitting: *Deleted

## 2017-02-07 ENCOUNTER — Telehealth: Payer: Self-pay | Admitting: Hematology

## 2017-02-07 ENCOUNTER — Encounter: Payer: Self-pay | Admitting: Hematology

## 2017-02-07 NOTE — Telephone Encounter (Signed)
Scheduled appt per 5/15 los - patient aware of appts added and will check my chart

## 2017-02-12 ENCOUNTER — Encounter: Payer: Self-pay | Admitting: Hematology

## 2017-02-13 ENCOUNTER — Telehealth: Payer: Self-pay

## 2017-02-14 ENCOUNTER — Encounter: Payer: Self-pay | Admitting: Hematology

## 2017-02-14 ENCOUNTER — Telehealth: Payer: Self-pay | Admitting: Hematology

## 2017-02-14 NOTE — Telephone Encounter (Signed)
Received copy of message from patient inquiring on her request to obtain a copy of corrected FMLA forms that were sent to her company [employer]. Tried to call patient to advise that copies were faxed to her e-mail from our fax machine and may have ended up in Spam folder and to let her know that I made a copy of forms and placed them in the Infusion room desk for her to pick up on Saturday. I didn't get an answer and no way to leave a message as voicemail box was full so I also mailed a copy of the completed forms. I also scanned copy in Epic under Media Tab

## 2017-02-21 ENCOUNTER — Ambulatory Visit (HOSPITAL_COMMUNITY)
Admission: RE | Admit: 2017-02-21 | Discharge: 2017-02-21 | Disposition: A | Discharge status: Home / Self Care | Payer: BLUE CROSS/BLUE SHIELD | Source: Ambulatory Visit | Attending: Hematology | Admitting: Hematology

## 2017-02-21 DIAGNOSIS — C8248 Follicular lymphoma grade IIIb, lymph nodes of multiple sites: Secondary | ICD-10-CM | POA: Diagnosis present

## 2017-02-21 LAB — GLUCOSE, CAPILLARY: GLUCOSE-CAPILLARY: 150 mg/dL — AB (ref 65–99)

## 2017-02-21 MED ORDER — FLUDEOXYGLUCOSE F - 18 (FDG) INJECTION: 9.5000 | Freq: Once | INTRAVENOUS | Status: DC | PRN

## 2017-02-24 ENCOUNTER — Encounter: Payer: Self-pay | Admitting: Hematology

## 2017-02-25 ENCOUNTER — Encounter: Payer: Self-pay | Admitting: Hematology

## 2017-02-25 ENCOUNTER — Ambulatory Visit: Payer: BLUE CROSS/BLUE SHIELD

## 2017-02-25 ENCOUNTER — Other Ambulatory Visit (HOSPITAL_BASED_OUTPATIENT_CLINIC_OR_DEPARTMENT_OTHER): Payer: BLUE CROSS/BLUE SHIELD

## 2017-02-25 ENCOUNTER — Ambulatory Visit (HOSPITAL_BASED_OUTPATIENT_CLINIC_OR_DEPARTMENT_OTHER): Payer: BLUE CROSS/BLUE SHIELD | Admitting: Hematology

## 2017-02-25 ENCOUNTER — Ambulatory Visit (HOSPITAL_BASED_OUTPATIENT_CLINIC_OR_DEPARTMENT_OTHER): Payer: BLUE CROSS/BLUE SHIELD

## 2017-02-25 ENCOUNTER — Telehealth: Payer: Self-pay | Admitting: Hematology

## 2017-02-25 VITALS — BP 108/61 | HR 86 | Temp 98.3°F | Resp 17

## 2017-02-25 VITALS — BP 149/75 | HR 80 | Temp 97.0°F | Resp 16

## 2017-02-25 DIAGNOSIS — Z452 Encounter for adjustment and management of vascular access device: Secondary | ICD-10-CM

## 2017-02-25 DIAGNOSIS — R252 Cramp and spasm: Secondary | ICD-10-CM

## 2017-02-25 DIAGNOSIS — Z5111 Encounter for antineoplastic chemotherapy: Secondary | ICD-10-CM | POA: Diagnosis not present

## 2017-02-25 DIAGNOSIS — G893 Neoplasm related pain (acute) (chronic): Secondary | ICD-10-CM

## 2017-02-25 DIAGNOSIS — G629 Polyneuropathy, unspecified: Secondary | ICD-10-CM | POA: Diagnosis not present

## 2017-02-25 DIAGNOSIS — C8248 Follicular lymphoma grade IIIb, lymph nodes of multiple sites: Secondary | ICD-10-CM | POA: Diagnosis not present

## 2017-02-25 DIAGNOSIS — Z5112 Encounter for antineoplastic immunotherapy: Secondary | ICD-10-CM | POA: Diagnosis not present

## 2017-02-25 DIAGNOSIS — Z95828 Presence of other vascular implants and grafts: Secondary | ICD-10-CM

## 2017-02-25 LAB — CBC & DIFF AND RETIC
BASO%: 0.9 % (ref 0.0–2.0)
Basophils Absolute: 0.1 10*3/uL (ref 0.0–0.1)
EOS%: 0.6 % (ref 0.0–7.0)
Eosinophils Absolute: 0 10*3/uL (ref 0.0–0.5)
HEMATOCRIT: 33.7 % — AB (ref 34.8–46.6)
HGB: 11.3 g/dL — ABNORMAL LOW (ref 11.6–15.9)
IMMATURE RETIC FRACT: 5.6 % (ref 1.60–10.00)
LYMPH%: 9.6 % — AB (ref 14.0–49.7)
MCH: 29.9 pg (ref 25.1–34.0)
MCHC: 33.5 g/dL (ref 31.5–36.0)
MCV: 89.2 fL (ref 79.5–101.0)
MONO#: 0.3 10*3/uL (ref 0.1–0.9)
MONO%: 4.4 % (ref 0.0–14.0)
NEUT#: 5.7 10*3/uL (ref 1.5–6.5)
NEUT%: 84.5 % — AB (ref 38.4–76.8)
PLATELETS: 277 10*3/uL (ref 145–400)
RBC: 3.78 10*6/uL (ref 3.70–5.45)
RDW: 16.4 % — ABNORMAL HIGH (ref 11.2–14.5)
RETIC CT ABS: 94.88 10*3/uL — AB (ref 33.70–90.70)
Retic %: 2.51 % — ABNORMAL HIGH (ref 0.70–2.10)
WBC: 6.8 10*3/uL (ref 3.9–10.3)
lymph#: 0.7 10*3/uL — ABNORMAL LOW (ref 0.9–3.3)
nRBC: 0 % (ref 0–0)

## 2017-02-25 LAB — COMPREHENSIVE METABOLIC PANEL
ALK PHOS: 67 U/L (ref 40–150)
ALT: 14 U/L (ref 0–55)
ANION GAP: 11 meq/L (ref 3–11)
AST: 18 U/L (ref 5–34)
Albumin: 4.1 g/dL (ref 3.5–5.0)
BUN: 12.2 mg/dL (ref 7.0–26.0)
CALCIUM: 9.8 mg/dL (ref 8.4–10.4)
CO2: 24 mEq/L (ref 22–29)
CREATININE: 0.7 mg/dL (ref 0.6–1.1)
Chloride: 106 mEq/L (ref 98–109)
EGFR: 85 mL/min/{1.73_m2} — ABNORMAL LOW (ref >90)
Glucose: 143 mg/dl — ABNORMAL HIGH (ref 70–140)
Potassium: 4.1 mEq/L (ref 3.5–5.1)
Sodium: 140 mEq/L (ref 136–145)
TOTAL PROTEIN: 7.5 g/dL (ref 6.4–8.3)
Total Bilirubin: 0.39 mg/dL (ref 0.20–1.20)

## 2017-02-25 LAB — LACTATE DEHYDROGENASE: LDH: 165 U/L (ref 125–245)

## 2017-02-25 MED ORDER — SODIUM CHLORIDE 0.9 % IV SOLN
750.0000 mg/m2 | Freq: Once | INTRAVENOUS | Status: AC
  Administered 2017-02-25: 1480 mg via INTRAVENOUS
  Filled 2017-02-25: qty 74

## 2017-02-25 MED ORDER — DEXAMETHASONE SODIUM PHOSPHATE 10 MG/ML IJ SOLN
INTRAMUSCULAR | Status: AC
  Filled 2017-02-25: qty 1

## 2017-02-25 MED ORDER — DIPHENHYDRAMINE HCL 25 MG PO CAPS
ORAL_CAPSULE | ORAL | Status: AC
  Filled 2017-02-25: qty 2

## 2017-02-25 MED ORDER — HEPARIN SOD (PORK) LOCK FLUSH 100 UNIT/ML IV SOLN
500.0000 [IU] | Freq: Once | INTRAVENOUS | Status: AC | PRN
  Administered 2017-02-25: 500 [IU]
  Filled 2017-02-25: qty 5

## 2017-02-25 MED ORDER — ACETAMINOPHEN 325 MG PO TABS
650.0000 mg | ORAL_TABLET | Freq: Once | ORAL | Status: AC
  Administered 2017-02-25: 650 mg via ORAL

## 2017-02-25 MED ORDER — ALTEPLASE 2 MG IJ SOLR
2.0000 mg | Freq: Once | INTRAMUSCULAR | Status: AC | PRN
  Administered 2017-02-25: 2 mg
  Filled 2017-02-25: qty 2

## 2017-02-25 MED ORDER — DOXORUBICIN HCL CHEMO IV INJECTION 2 MG/ML
50.0000 mg/m2 | Freq: Once | INTRAVENOUS | Status: AC
  Administered 2017-02-25: 98 mg via INTRAVENOUS
  Filled 2017-02-25: qty 49

## 2017-02-25 MED ORDER — SODIUM CHLORIDE 0.9 % IV SOLN
375.0000 mg/m2 | Freq: Once | INTRAVENOUS | Status: AC
  Administered 2017-02-25: 700 mg via INTRAVENOUS
  Filled 2017-02-25: qty 50

## 2017-02-25 MED ORDER — DEXAMETHASONE SODIUM PHOSPHATE 10 MG/ML IJ SOLN
10.0000 mg | Freq: Once | INTRAMUSCULAR | Status: AC
  Administered 2017-02-25: 10 mg via INTRAVENOUS

## 2017-02-25 MED ORDER — PALONOSETRON HCL INJECTION 0.25 MG/5ML
INTRAVENOUS | Status: AC
  Filled 2017-02-25: qty 5

## 2017-02-25 MED ORDER — ACETAMINOPHEN 325 MG PO TABS
ORAL_TABLET | ORAL | Status: AC
  Filled 2017-02-25: qty 2

## 2017-02-25 MED ORDER — SODIUM CHLORIDE 0.9 % IV SOLN
Freq: Once | INTRAVENOUS | Status: AC
  Administered 2017-02-25: 12:00:00 via INTRAVENOUS

## 2017-02-25 MED ORDER — PEGFILGRASTIM 6 MG/0.6ML ~~LOC~~ PSKT
6.0000 mg | PREFILLED_SYRINGE | Freq: Once | SUBCUTANEOUS | Status: AC
  Administered 2017-02-25: 6 mg via SUBCUTANEOUS
  Filled 2017-02-25: qty 0.6

## 2017-02-25 MED ORDER — SODIUM CHLORIDE 0.9% FLUSH
10.0000 mL | INTRAVENOUS | Status: DC | PRN
  Administered 2017-02-25: 10 mL via INTRAVENOUS
  Filled 2017-02-25: qty 10

## 2017-02-25 MED ORDER — VINCRISTINE SULFATE CHEMO INJECTION 1 MG/ML
2.0000 mg | Freq: Once | INTRAVENOUS | Status: AC
  Administered 2017-02-25: 2 mg via INTRAVENOUS
  Filled 2017-02-25: qty 2

## 2017-02-25 MED ORDER — SODIUM CHLORIDE 0.9% FLUSH
10.0000 mL | INTRAVENOUS | Status: DC | PRN
  Administered 2017-02-25: 10 mL
  Filled 2017-02-25: qty 10

## 2017-02-25 MED ORDER — PALONOSETRON HCL INJECTION 0.25 MG/5ML
0.2500 mg | Freq: Once | INTRAVENOUS | Status: AC
  Administered 2017-02-25: 0.25 mg via INTRAVENOUS

## 2017-02-25 MED ORDER — DIPHENHYDRAMINE HCL 25 MG PO CAPS
50.0000 mg | ORAL_CAPSULE | Freq: Once | ORAL | Status: AC
  Administered 2017-02-25: 50 mg via ORAL

## 2017-02-25 NOTE — Progress Notes (Signed)
Blood return noted before and every 3 cc of Adriamycin until approx 1305, no blood return noted Adriamycin push stopped and at 1318 TPA instilled.  1426: blood return noted before, every 3 cc and after Adriamycin push.  Blood return noted before and after Vincristine infusion

## 2017-02-25 NOTE — Patient Instructions (Signed)
[place] [facility] Discharge Instructions for Patients Receiving Chemotherapy  Today you received the following chemotherapy agents: Adriamycin, Vincristine, Cytoxan and Rituxan   To help prevent nausea and vomiting after your treatment, we encourage you to take your nausea medication as directed.    If you develop nausea and vomiting that is not controlled by your nausea medication, call the clinic.   BELOW ARE SYMPTOMS THAT SHOULD BE REPORTED IMMEDIATELY:  *FEVER GREATER THAN 100.5 F  *CHILLS WITH OR WITHOUT FEVER  NAUSEA AND VOMITING THAT IS NOT CONTROLLED WITH YOUR NAUSEA MEDICATION  *UNUSUAL SHORTNESS OF BREATH  *UNUSUAL BRUISING OR BLEEDING  TENDERNESS IN MOUTH AND THROAT WITH OR WITHOUT PRESENCE OF ULCERS  *URINARY PROBLEMS  *BOWEL PROBLEMS  UNUSUAL RASH Items with * indicate a potential emergency and should be followed up as soon as possible.  Feel free to call the clinic you have any questions or concerns. The clinic phone number is [phone].  Please show the [place] at check-in to the Emergency Department and triage nurse.

## 2017-02-25 NOTE — Progress Notes (Signed)
1410 Positive blood return noted. Port flushed easily without difficulty, swelling, redness or pain. Adriamycin push continued.

## 2017-02-25 NOTE — Patient Instructions (Signed)

## 2017-02-25 NOTE — Patient Instructions (Signed)
Thank you for choosing [facility] to provide your oncology and hematology care.  To afford each patient quality time with our providers, please arrive 30 minutes before your scheduled appointment time.  If you arrive late for your appointment, you may be asked to reschedule.  We strive to give you quality time with our providers, and arriving late affects you and other patients whose appointments are after yours.   If you are a no show for multiple scheduled visits, you may be dismissed from the clinic at the providers discretion.    Again, thank you for choosing [facility], our hope is that these requests will decrease the amount of time that you wait before being seen by our physicians.  ______________________________________________________________________  Should you have questions after your visit to the [facility], please contact our office at [phone] between the hours of 8:30 and 4:30 p.m.    Voicemails left after 4:30p.m will not be returned until the following business day.    For prescription refill requests, please have your pharmacy contact us directly.  Please also try to allow 48 hours for prescription requests.    Please contact the scheduling department for questions regarding scheduling.  For scheduling of procedures such as PET scans, CT scans, MRI, Ultrasound, etc please contact central scheduling at [phone].    Resources For Cancer Patients and Caregivers:   Oncolink.org:  A wonderful resource for patients and healthcare providers for information regarding your disease, ways to tract your treatment, what to expect, etc.     American Cancer Society:  800-227-2345  Can help patients locate various types of support and financial assistance  Cancer Care: 1-800-813-HOPE (4673) Provides financial assistance, online support groups, medication/co-pay assistance.    [place] DSS:  336-641-3447 Where to apply for food  stamps, Medicaid, and utility assistance  Medicare Rights Center: 800-333-4114 Helps people with Medicare understand their rights and benefits, navigate the Medicare system, and secure the quality healthcare they deserve  SCAT: 336-333-6589 [place] [employer]'s shared-ride transportation service for eligible riders who have a disability that prevents them from riding the fixed route bus.    For additional information on assistance programs please contact our social worker:   Grier Hock/Abigail Elmore:  [phone]            

## 2017-02-25 NOTE — Telephone Encounter (Signed)
Gave patient AVS and calender per 6/5 LOS.

## 2017-02-26 ENCOUNTER — Encounter: Payer: Self-pay | Admitting: Hematology

## 2017-03-07 ENCOUNTER — Telehealth: Payer: Self-pay

## 2017-03-12 NOTE — Progress Notes (Signed)
Marland Kitchen    HEMATOLOGY/ONCOLOGY CLINIC NOTE  Date of Service: .02/25/2017  Patient Care Team: Asencion Noble, MD as PCP - General (Internal Medicine)  CHIEF COMPLAINTS/PURPOSE OF CONSULTATION:   f/u for high grade follicular lymphoma  HISTORY OF PRESENTING ILLNESS:   Katelyn Reeves is a wonderful 66 y.o. female who has been referred to [place] by Dr .Asencion Noble, MD for evaluation and management of suspected non-Hodgkin's lymphoma.  Patient notes that she presented with back pain for 4-6 weeks and eventually had pain radiating to her right inguinal area. She notes that she has had interstitial cystitis in the past. She was thought to have a urinary tract infection and was treated with Macrodantin. Says the pain got worse and started radiating into the right groin she had a CT of the abdomen renal stone protocol on 10/25/2016 which showed extensive retroperitoneal lymphadenopathy concerning for lymphoproliferative disorder. She was incidentally noted to have a nodular border of the liver with possible liver cirrhosis. No urinary stones noted.  She subsequently had a PET CT scan ordered by her primary care physician which was done on 11/07/2016. This showed diffuse hypermetabolic spleen with mild splenomegaly. Hypermetabolic adenopathy in the left supraclavicular, bilateral axillary, peripancreatic, gastrohepatic ligament, celiac, porta hepatis, periaortic, and left external iliac chains. Hypermetabolic mesenteric lymph nodes. Appearance favors lymphoma.  Patient notes some night sweats for 2 weeks. Has lost about 10 pounds in the last month. No acute fevers or chills. Notes the back pain is controlled with when necessary Vicodin.  INTERVAL HISTORY  Ms Mcginnis is here for her scheduled follow-up prior to her 4th cycle of R CHOP chemotherapy. She notes no acute new issues. Feels well. No nausea no vomiting. Minimal tingling in her fingers likely related to vincristine which has resolved currently. She had a  PET/CT on 02/21/2017 which shows complete metabolic response of her lymphoma. Images were reviewed and patients questions were answered. She is understandably glad with the response to treatment No further back pains. No fevers no chills no night sweats. Counseled on continued infection prevention strategies. Filled out paper for her continued short term disability while on treatment.   MEDICAL HISTORY:  #1 fibrocystic disease of the breast #2 hypertension #3 constipation #4 duplicated right sided ureters #5 imaging concern for possible liver cirrhosis  SURGICAL HISTORY:  #1 total abdominal hysterectomy with bilateral salpingo-oophorectomy for ovarian cyst. #2 Lipoma removal left upper back #3 hemorrhoidectomy  SOCIAL HISTORY: Social History   Social History  . Marital status: Divorced    Spouse name: N/A  . Number of children: N/A  . Years of education: N/A   Occupational History  . Not on file.   Social History Main Topics  . Smoking status: Never Smoker  . Smokeless tobacco: Never Used  . Alcohol use No  . Drug use: No  . Sexual activity: Not on file   Other Topics Concern  . Not on file   Social History Narrative  . No narrative on file  Nonsmoker no issues with alcohol use or drug use. 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   FAMILY HISTORY:  Notes her mother had ovarian cancer followed by leukemia and died at age 70 years Maternal aunt gastric cancer  ALLERGIES:  is allergic to sulfa antibiotics.  MEDICATIONS:  Current Outpatient Prescriptions  Medication Sig Dispense Refill  . HYDROcodone-acetaminophen (NORCO) 5-325 MG tablet Take 1-2 tablets by mouth every 6 (six) hours as needed for moderate pain or  severe pain. 30 tablet 0  . lidocaine-prilocaine (EMLA) cream Apply to affected area once 30 g 3  . naproxen (NAPROSYN) 500 MG tablet Take 1 tablet (500 mg total) by mouth 2 (two) times daily with a meal. For 3  days then 1 tab po BID as needed 20 tablet 0  . ondansetron (ZOFRAN) 8 MG tablet Take 1 tablet (8 mg total) by mouth 2 (two) times daily as needed for refractory nausea / vomiting. Start on day 3 after cyclophosphamide. 30 tablet 1  . predniSONE (DELTASONE) 20 MG tablet Take 3 tablets (60 mg total) by mouth daily. Take on days 1-5 of chemotherapy. 15 tablet 6  . prochlorperazine (COMPAZINE) 10 MG tablet Take 1 tablet (10 mg total) by mouth every 6 (six) hours as needed (Nausea or vomiting). 30 tablet 6  . senna-docusate (SENNA S) 8.6-50 MG tablet Take 2 tablets by mouth at bedtime as needed for mild constipation or moderate constipation. 60 tablet 1  . tiZANidine (ZANAFLEX) 2 MG tablet Take 2 tablets (4 mg total) by mouth every 8 (eight) hours as needed for muscle spasms. 30 tablet 0   No current facility-administered medications for this visit.     REVIEW OF SYSTEMS:    10 Point review of Systems was done is negative except as noted above.  PHYSICAL EXAMINATION: ECOG PERFORMANCE STATUS: 1 - Symptomatic but completely ambulatory  . Vitals:   02/25/17 1100  BP: (!) 149/75  Pulse: 80  Resp: 16  Temp: 97 F (36.1 C)   There were no vitals filed for this visit. .There is no height or weight on file to calculate BMI.  GENERAL:alert, in no acute distress and comfortable SKIN: skin color, texture, turgor are normal, no rashes or significant lesions EYES: normal, conjunctiva are pink and non-injected, sclera clear OROPHARYNX:no exudate, no erythema and lips, buccal mucosa, and tongue normal  NECK: supple, no JVD, thyroid normal size, non-tender, without nodularity LYMPH: palpable left supraclavicular and left axillary lymph node. Smaller right axillary lymph node. LUNGS: clear to auscultation with normal respiratory effort HEART: regular rate & rhythm,  no murmurs and no lower extremity edema ABDOMEN: abdomen soft, non-tender, normoactive bowel sounds , mild tenderness to palpation in  left upper abdomen. Musculoskeletal: no cyanosis of digits and no clubbing  PSYCH: alert & oriented x 3 with fluent speech NEURO: no focal motor/sensory deficits  LABORATORY DATA:  I have reviewed the data as listed  . CBC Latest Ref Rng & Units 02/25/2017 02/04/2017 01/14/2017  WBC 3.9 - 10.3 10e3/uL 6.8 5.7 6.2  Hemoglobin 11.6 - 15.9 g/dL 11.3(L) 11.0(L) 11.9  Hematocrit 34.8 - 46.6 % 33.7(L) 33.1(L) 34.7(L)  Platelets 145 - 400 10e3/uL 277 264 315   CBC    Component Value Date/Time   WBC 6.8 02/25/2017 1020   WBC 5.0 12/20/2016 1141   RBC 3.78 02/25/2017 1020   RBC 4.16 12/20/2016 1141   HGB 11.3 (L) 02/25/2017 1020   HCT 33.7 (L) 02/25/2017 1020   PLT 277 02/25/2017 1020   MCV 89.2 02/25/2017 1020   MCH 29.9 02/25/2017 1020   MCH 29.1 12/20/2016 1141   MCHC 33.5 02/25/2017 1020   MCHC 33.8 12/20/2016 1141   RDW 16.4 (H) 02/25/2017 1020   LYMPHSABS 0.7 (L) 02/25/2017 1020   MONOABS 0.3 02/25/2017 1020   EOSABS 0.0 02/25/2017 1020   BASOSABS 0.1 02/25/2017 1020   . CMP Latest Ref Rng & Units 02/25/2017 02/04/2017 01/14/2017  Glucose 70 - 140 mg/dl 143(H)  161(H) 167(H)  BUN 7.0 - 26.0 mg/dL 12.2 11.0 11.3  Creatinine 0.6 - 1.1 mg/dL 0.7 0.7 0.7  Sodium 136 - 145 mEq/L 140 141 141  Potassium 3.5 - 5.1 mEq/L 4.1 4.1 3.9  CO2 22 - 29 mEq/L 24 25 25   Calcium 8.4 - 10.4 mg/dL 9.8 9.6 9.5  Total Protein 6.4 - 8.3 g/dL 7.5 7.4 7.3  Total Bilirubin 0.20 - 1.20 mg/dL 0.39 0.37 0.33  Alkaline Phos 40 - 150 U/L 67 73 81  AST 5 - 34 U/L 18 17 19   ALT 0 - 55 U/L 14 16 18    . Lab Results  Component Value Date   LDH 165 02/25/2017   Component     Latest Ref Rng & Units 11/12/2016  Hep C Virus Ab     0.0 - 0.9 s/co ratio <0.1  Hepatitis B Surface Ag     Negative Negative  Hep B Core Ab, Tot     Negative Negative       *[place]*                  *[facility] [employer].                        [place], [place] [address]                             [phone]  ------------------------------------------------------------------- Transthoracic Echocardiography  Patient:    Destane, Speas MR #:       [id number] Study Date: 11/19/2016 Gender:     F Age:        26 Height:     160 cm Weight:     90.2 kg BSA:        2.04 m^2 Pt. Status: Room:   SONOGRAPHER  Tresa Res, RDCS  PERFORMING   [facility], Outpatient  ATTENDING    Roselee Nova     [place], [place] Kishore  REFERRING    [place], [place] Kishore  cc:  ------------------------------------------------------------------- LV EF: 60% -   65%  ------------------------------------------------------------------- Indications:      V58.11 Chemotherapy Evaluation.  ------------------------------------------------------------------- History:   Risk factors:  Lymphoma. Obese.  ------------------------------------------------------------------- Study Conclusions  - Left ventricle: The cavity size was normal. There was mild   concentric hypertrophy. Systolic function was normal. The   estimated ejection fraction was in the range of 60% to 65%. Wall   motion was normal; there were no regional wall motion   abnormalities. Doppler parameters are consistent with abnormal   left ventricular relaxation (grade 1 diastolic dysfunction).   Doppler parameters are consistent with indeterminate ventricular   filling pressure. - Aortic valve: Transvalvular velocity was within the normal range.   There was no stenosis. There was trivial regurgitation. - Mitral valve: Transvalvular velocity was within the normal range.   There was no evidence for stenosis. There was trivial   regurgitation. - Right ventricle: The cavity size was normal. Wall thickness was   normal. Systolic function was normal. - Atrial septum: No defect or patent foramen ovale was identified   by color flow Doppler. - Tricuspid valve: There was no regurgitation. - Global  longitudinal strain -17.6%   RADIOGRAPHIC STUDIES: I have personally reviewed the  radiological images as listed and agreed with the findings in the report. Nm Pet Image Restag (ps) Skull Base To Thigh  Result Date: 02/21/2017 CLINICAL DATA:  Subsequent treatment strategy for grade 3B follicular lymphoma. Status post initial chemotherapy. EXAM: NUCLEAR MEDICINE PET SKULL BASE TO THIGH TECHNIQUE: 9.5 mCi F-18 FDG was injected intravenously. Full-ring PET imaging was performed from the skull base to thigh after the radiotracer. CT data was obtained and used for attenuation correction and anatomic localization. FASTING BLOOD GLUCOSE:  Value: 150 mg/dl COMPARISON:  PET-CT 11/07/2016 FINDINGS: NECK Resolution of hypermetabolic LEFT supraclavicular adenopathy. CHEST Resolution of hypermetabolic axillary lymphadenopathy. No suspicious pulmonary nodules. ABDOMEN/PELVIS Resolution of metabolic activity within the spleen. Activity now normal (less than liver activity). Interval decrease in size and metabolic activity of retroperitoneal adenopathy. Example LEFT periaortic lymph node measures 20 mm (image 121, series 4) compared with 25 mm. Lymph node now has metabolic activity less than mediastinal activity. Bulky lymph nodes to the RIGHT of the aorta also resolved in metabolic activity. Example 10 mm node adjacent the IVC (image 127, series 4) decreased from 21 mm with SUV max less than or equal blood pooled decreased from SUV max 25. SKELETON No evidence of abnormal marrow activity. IMPRESSION: 1. Resolution metabolic activity associated thoracic and periaortic retroperitoneal lymph nodes consistent with positive chemotherapy response ( Deauville 2). 2. Resolution of metabolic activity in the spleen which is now normal (less than liver). 3. No evidence of new or progressive lymphoma. Electronically Signed   By: Suzy Bouchard M.D.   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 Also noted to have left supraclavicular and bilateral axillary left more than right FDG avid lymphadenopathy. Based on imaging this would represent at least Stage III disease if this were a lymphoma. LDH level is not significantly elevated. Blood counts are stable. PET/CT scan does show fairly active disease which is FDG avid with FDG avid splenomegaly. Patient has some constitutional symptoms with about a 10 pound weight loss and some night sweats. Hepatitis profile negative. No other obvious new focal symptoms.  Patient has had an ECHO which shows nl EF  Patient is status post 3 cycles of R CHOP with no prohibitive toxicities.  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 Most of the symptoms have resolved between treatment cycles. No indications for dose reduction at this time. Plan - Patient's labs are stable today with no overt prohibitive treatment toxicities. -her PET/CT results and images were reviewed in details with her -We shall proceed with her 4th planned cycle of R CHOP chemotherapy with Neulasta support.  #3 severe bilateral back spasms ? Flare of osteoarthritis.  Patient had lumbar and lumbosacral x-rays which showed no new osseous lesions. Patient notes no further back pain or back spasms at this time and has not been any pain medications for this. This has resolved..  Continue Treatment as per orders. RTC with labs in 3 weeks with Irene Limbo on C5D1  All of the patients questions were answered with apparent satisfaction. The patient knows to call the clinic with any problems, questions or concerns.  I spent 20 minutes counseling the patient face to face. The  total time  spent in the appointment was 25 minutes and more than 50% was on counseling and direct patient cares.    Sullivan Lone MD [place] AAHIVMS [facility] Hematology/Oncology Physician [facility]  (Office):       [phone] (Work cell):  [phone] (Fax):           [phone]

## 2017-03-18 ENCOUNTER — Ambulatory Visit (HOSPITAL_BASED_OUTPATIENT_CLINIC_OR_DEPARTMENT_OTHER): Payer: BLUE CROSS/BLUE SHIELD | Admitting: Hematology

## 2017-03-18 ENCOUNTER — Ambulatory Visit: Payer: BLUE CROSS/BLUE SHIELD

## 2017-03-18 ENCOUNTER — Other Ambulatory Visit (HOSPITAL_BASED_OUTPATIENT_CLINIC_OR_DEPARTMENT_OTHER): Payer: BLUE CROSS/BLUE SHIELD

## 2017-03-18 ENCOUNTER — Encounter: Payer: Self-pay | Admitting: Hematology

## 2017-03-18 ENCOUNTER — Ambulatory Visit (HOSPITAL_BASED_OUTPATIENT_CLINIC_OR_DEPARTMENT_OTHER): Payer: BLUE CROSS/BLUE SHIELD

## 2017-03-18 VITALS — BP 143/74 | HR 80 | Temp 98.7°F | Resp 17 | Ht 62.0 in | Wt 192.4 lb

## 2017-03-18 VITALS — BP 117/65 | HR 90 | Temp 98.7°F | Resp 20

## 2017-03-18 DIAGNOSIS — G629 Polyneuropathy, unspecified: Secondary | ICD-10-CM | POA: Diagnosis not present

## 2017-03-18 DIAGNOSIS — C8248 Follicular lymphoma grade IIIb, lymph nodes of multiple sites: Secondary | ICD-10-CM

## 2017-03-18 DIAGNOSIS — Z5111 Encounter for antineoplastic chemotherapy: Secondary | ICD-10-CM | POA: Diagnosis not present

## 2017-03-18 DIAGNOSIS — R252 Cramp and spasm: Secondary | ICD-10-CM | POA: Diagnosis not present

## 2017-03-18 DIAGNOSIS — Z5112 Encounter for antineoplastic immunotherapy: Secondary | ICD-10-CM | POA: Diagnosis not present

## 2017-03-18 DIAGNOSIS — Z95828 Presence of other vascular implants and grafts: Secondary | ICD-10-CM

## 2017-03-18 LAB — COMPREHENSIVE METABOLIC PANEL
ALBUMIN: 3.9 g/dL (ref 3.5–5.0)
ALK PHOS: 71 U/L (ref 40–150)
ALT: 14 U/L (ref 0–55)
AST: 16 U/L (ref 5–34)
Anion Gap: 10 mEq/L (ref 3–11)
BILIRUBIN TOTAL: 0.43 mg/dL (ref 0.20–1.20)
BUN: 13 mg/dL (ref 7.0–26.0)
CO2: 24 meq/L (ref 22–29)
CREATININE: 0.8 mg/dL (ref 0.6–1.1)
Calcium: 9.8 mg/dL (ref 8.4–10.4)
Chloride: 106 mEq/L (ref 98–109)
EGFR: 76 mL/min/{1.73_m2} — AB (ref >90)
GLUCOSE: 162 mg/dL — AB (ref 70–140)
Potassium: 4.2 mEq/L (ref 3.5–5.1)
SODIUM: 139 meq/L (ref 136–145)
TOTAL PROTEIN: 7.2 g/dL (ref 6.4–8.3)

## 2017-03-18 LAB — CBC & DIFF AND RETIC
BASO%: 0.8 % (ref 0.0–2.0)
Basophils Absolute: 0.1 10*3/uL (ref 0.0–0.1)
EOS ABS: 0 10*3/uL (ref 0.0–0.5)
EOS%: 0.7 % (ref 0.0–7.0)
HCT: 32.2 % — ABNORMAL LOW (ref 34.8–46.6)
HEMOGLOBIN: 10.9 g/dL — AB (ref 11.6–15.9)
IMMATURE RETIC FRACT: 4.1 % (ref 1.60–10.00)
LYMPH#: 0.5 10*3/uL — AB (ref 0.9–3.3)
LYMPH%: 7.7 % — AB (ref 14.0–49.7)
MCH: 31.2 pg (ref 25.1–34.0)
MCHC: 33.9 g/dL (ref 31.5–36.0)
MCV: 92.3 fL (ref 79.5–101.0)
MONO#: 0.3 10*3/uL (ref 0.1–0.9)
MONO%: 4.9 % (ref 0.0–14.0)
NEUT#: 5.3 10*3/uL (ref 1.5–6.5)
NEUT%: 85.9 % — AB (ref 38.4–76.8)
PLATELETS: 248 10*3/uL (ref 145–400)
RBC: 3.49 10*6/uL — AB (ref 3.70–5.45)
RDW: 15.8 % — ABNORMAL HIGH (ref 11.2–14.5)
RETIC CT ABS: 73.99 10*3/uL (ref 33.70–90.70)
Retic %: 2.12 % — ABNORMAL HIGH (ref 0.70–2.10)
WBC: 6.1 10*3/uL (ref 3.9–10.3)

## 2017-03-18 LAB — LACTATE DEHYDROGENASE: LDH: 149 U/L (ref 125–245)

## 2017-03-18 MED ORDER — PALONOSETRON HCL INJECTION 0.25 MG/5ML
INTRAVENOUS | Status: AC
  Filled 2017-03-18: qty 5

## 2017-03-18 MED ORDER — VINCRISTINE SULFATE CHEMO INJECTION 1 MG/ML
2.0000 mg | Freq: Once | INTRAVENOUS | Status: AC
  Administered 2017-03-18: 2 mg via INTRAVENOUS
  Filled 2017-03-18: qty 2

## 2017-03-18 MED ORDER — DOXORUBICIN HCL CHEMO IV INJECTION 2 MG/ML
50.0000 mg/m2 | Freq: Once | INTRAVENOUS | Status: AC
  Administered 2017-03-18: 98 mg via INTRAVENOUS
  Filled 2017-03-18: qty 49

## 2017-03-18 MED ORDER — DEXAMETHASONE SODIUM PHOSPHATE 10 MG/ML IJ SOLN
INTRAMUSCULAR | Status: AC
  Filled 2017-03-18: qty 1

## 2017-03-18 MED ORDER — SODIUM CHLORIDE 0.9% FLUSH
10.0000 mL | INTRAVENOUS | Status: DC | PRN
  Administered 2017-03-18: 10 mL
  Filled 2017-03-18: qty 10

## 2017-03-18 MED ORDER — DEXAMETHASONE SODIUM PHOSPHATE 10 MG/ML IJ SOLN
10.0000 mg | Freq: Once | INTRAMUSCULAR | Status: AC
  Administered 2017-03-18: 10 mg via INTRAVENOUS

## 2017-03-18 MED ORDER — DIPHENHYDRAMINE HCL 25 MG PO CAPS
ORAL_CAPSULE | ORAL | Status: AC
  Filled 2017-03-18: qty 2

## 2017-03-18 MED ORDER — DIPHENHYDRAMINE HCL 25 MG PO CAPS
50.0000 mg | ORAL_CAPSULE | Freq: Once | ORAL | Status: AC
  Administered 2017-03-18: 50 mg via ORAL

## 2017-03-18 MED ORDER — PEGFILGRASTIM 6 MG/0.6ML ~~LOC~~ PSKT
6.0000 mg | PREFILLED_SYRINGE | Freq: Once | SUBCUTANEOUS | Status: AC
  Administered 2017-03-18: 6 mg via SUBCUTANEOUS
  Filled 2017-03-18: qty 0.6

## 2017-03-18 MED ORDER — HEPARIN SOD (PORK) LOCK FLUSH 100 UNIT/ML IV SOLN
500.0000 [IU] | Freq: Once | INTRAVENOUS | Status: AC | PRN
  Administered 2017-03-18: 500 [IU]
  Filled 2017-03-18: qty 5

## 2017-03-18 MED ORDER — ACETAMINOPHEN 325 MG PO TABS
650.0000 mg | ORAL_TABLET | Freq: Once | ORAL | Status: AC
  Administered 2017-03-18: 650 mg via ORAL

## 2017-03-18 MED ORDER — ACETAMINOPHEN 325 MG PO TABS
ORAL_TABLET | ORAL | Status: AC
  Filled 2017-03-18: qty 2

## 2017-03-18 MED ORDER — PALONOSETRON HCL INJECTION 0.25 MG/5ML
0.2500 mg | Freq: Once | INTRAVENOUS | Status: AC
  Administered 2017-03-18: 0.25 mg via INTRAVENOUS

## 2017-03-18 MED ORDER — SODIUM CHLORIDE 0.9 % IV SOLN
750.0000 mg/m2 | Freq: Once | INTRAVENOUS | Status: AC
  Administered 2017-03-18: 1480 mg via INTRAVENOUS
  Filled 2017-03-18: qty 74

## 2017-03-18 MED ORDER — SODIUM CHLORIDE 0.9 % IV SOLN
375.0000 mg/m2 | Freq: Once | INTRAVENOUS | Status: AC
  Administered 2017-03-18: 700 mg via INTRAVENOUS
  Filled 2017-03-18: qty 50

## 2017-03-18 MED ORDER — SODIUM CHLORIDE 0.9% FLUSH
10.0000 mL | INTRAVENOUS | Status: DC | PRN
  Administered 2017-03-18: 10 mL via INTRAVENOUS
  Filled 2017-03-18: qty 10

## 2017-03-18 MED ORDER — SODIUM CHLORIDE 0.9 % IV SOLN
Freq: Once | INTRAVENOUS | Status: AC
  Administered 2017-03-18: 14:00:00 via INTRAVENOUS

## 2017-03-18 NOTE — Patient Instructions (Signed)
Implanted Port Home Guide An implanted port is a type of central line that is placed under the skin. Central lines are used to provide IV access when treatment or nutrition needs to be given through a person's veins. Implanted ports are used for long-term IV access. An implanted port may be placed because:  You need IV medicine that would be irritating to the small veins in your hands or arms.  You need long-term IV medicines, such as antibiotics.  You need IV nutrition for a long period.  You need frequent blood draws for lab tests.  You need dialysis.  Implanted ports are usually placed in the chest area, but they can also be placed in the upper arm, the abdomen, or the leg. An implanted port has two main parts:  Reservoir. The reservoir is round and will appear as a small, raised area under your skin. The reservoir is the part where a needle is inserted to give medicines or draw blood.  Catheter. The catheter is a thin, flexible tube that extends from the reservoir. The catheter is placed into a large vein. Medicine that is inserted into the reservoir goes into the catheter and then into the vein.  How will I care for my incision site? Do not get the incision site wet. Bathe or shower as directed by your health care provider. How is my port accessed? Special steps must be taken to access the port:  Before the port is accessed, a numbing cream can be placed on the skin. This helps numb the skin over the port site.  Your health care provider uses a sterile technique to access the port. ? Your health care provider must put on a mask and sterile gloves. ? The skin over your port is cleaned carefully with an antiseptic and allowed to dry. ? The port is gently pinched between sterile gloves, and a needle is inserted into the port.  Only "non-coring" port needles should be used to access the port. Once the port is accessed, a blood return should be checked. This helps ensure that the port  is in the vein and is not clogged.  If your port needs to remain accessed for a constant infusion, a clear (transparent) bandage will be placed over the needle site. The bandage and needle will need to be changed every week, or as directed by your health care provider.  Keep the bandage covering the needle clean and dry. Do not get it wet. Follow your health care provider's instructions on how to take a shower or bath while the port is accessed.  If your port does not need to stay accessed, no bandage is needed over the port.  What is flushing? Flushing helps keep the port from getting clogged. Follow your health care provider's instructions on how and when to flush the port. Ports are usually flushed with saline solution or a medicine called heparin. The need for flushing will depend on how the port is used.  If the port is used for intermittent medicines or blood draws, the port will need to be flushed: ? After medicines have been given. ? After blood has been drawn. ? As part of routine maintenance.  If a constant infusion is running, the port may not need to be flushed.  How long will my port stay implanted? The port can stay in for as long as your health care provider thinks it is needed. When it is time for the port to come out, surgery will be   done to remove it. The procedure is similar to the one performed when the port was put in. When should I seek immediate medical care? When you have an implanted port, you should seek immediate medical care if:  You notice a bad smell coming from the incision site.  You have swelling, redness, or drainage at the incision site.  You have more swelling or pain at the port site or the surrounding area.  You have a fever that is not controlled with medicine.  This information is not intended to replace advice given to you by your health care provider. Make sure you discuss any questions you have with your health care provider. Document  Released: 09/09/2005 Document Revised: 02/15/2016 Document Reviewed: 05/17/2013 [employer] Interactive Patient Education  2017 [employer]  

## 2017-03-18 NOTE — Progress Notes (Signed)
Marland Kitchen    HEMATOLOGY/ONCOLOGY CLINIC NOTE  Date of Service: .03/18/2017  Patient Care Team: Asencion Noble, MD as PCP - General (Internal Medicine)  CHIEF COMPLAINTS/PURPOSE OF CONSULTATION:   f/u for high grade follicular lymphoma  HISTORY OF PRESENTING ILLNESS:   Katelyn Reeves is a wonderful 66 y.o. female who has been referred to [place] by Dr .Asencion Noble, MD for evaluation and management of suspected non-Hodgkin's lymphoma.  Patient notes that she presented with back pain for 4-6 weeks and eventually had pain radiating to her right inguinal area. She notes that she has had interstitial cystitis in the past. She was thought to have a urinary tract infection and was treated with Macrodantin. Says the pain got worse and started radiating into the right groin she had a CT of the abdomen renal stone protocol on 10/25/2016 which showed extensive retroperitoneal lymphadenopathy concerning for lymphoproliferative disorder. She was incidentally noted to have a nodular border of the liver with possible liver cirrhosis. No urinary stones noted.  She subsequently had a PET CT scan ordered by her primary care physician which was done on 11/07/2016. This showed diffuse hypermetabolic spleen with mild splenomegaly. Hypermetabolic adenopathy in the left supraclavicular, bilateral axillary, peripancreatic, gastrohepatic ligament, celiac, porta hepatis, periaortic, and left external iliac chains. Hypermetabolic mesenteric lymph nodes. Appearance favors lymphoma.  Patient notes some night sweats for 2 weeks. Has lost about 10 pounds in the last month. No acute fevers or chills. Notes the back pain is controlled with when necessary Vicodin.  INTERVAL HISTORY  Katelyn Reeves is here for her scheduled follow-up prior to her 5th cycle of R CHOP chemotherapy. She notes no acute new issues. Feels well. No nausea no vomiting. Minimal tingling in her fingers likely related to vincristine which has resolved currently. No fevers no  chills no night sweats. Counseled on continued infection prevention strategies.   MEDICAL HISTORY:  #1 fibrocystic disease of the breast #2 hypertension #3 constipation #4 duplicated right sided ureters #5 imaging concern for possible liver cirrhosis  SURGICAL HISTORY:  #1 total abdominal hysterectomy with bilateral salpingo-oophorectomy for ovarian cyst. #2 Lipoma removal left upper back #3 hemorrhoidectomy  SOCIAL HISTORY: Social History   Social History  . Marital status: Divorced    Spouse name: N/A  . Number of children: N/A  . Years of education: N/A   Occupational History  . Not on file.   Social History Main Topics  . Smoking status: Never Smoker  . Smokeless tobacco: Never Used  . Alcohol use No  . Drug use: No  . Sexual activity: Not on file   Other Topics Concern  . Not on file   Social History Narrative  . No narrative on file  Nonsmoker no issues with alcohol use or drug use. 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   FAMILY HISTORY:  Notes her mother had ovarian cancer followed by leukemia and died at age 58 years Maternal aunt gastric cancer  ALLERGIES:  is allergic to sulfa antibiotics.  MEDICATIONS:  Current Outpatient Prescriptions  Medication Sig Dispense Refill  . HYDROcodone-acetaminophen (NORCO) 5-325 MG tablet Take 1-2 tablets by mouth every 6 (six) hours as needed for moderate pain or severe pain. 30 tablet 0  . lidocaine-prilocaine (EMLA) cream Apply to affected area once 30 g 3  . naproxen (NAPROSYN) 500 MG tablet Take 1 tablet (500 mg total) by mouth 2 (two) times daily with a meal. For 3 days then 1 tab po  BID as needed 20 tablet 0  . ondansetron (ZOFRAN) 8 MG tablet Take 1 tablet (8 mg total) by mouth 2 (two) times daily as needed for refractory nausea / vomiting. Start on day 3 after cyclophosphamide. 30 tablet 1  . predniSONE (DELTASONE) 20 MG tablet Take 3 tablets (60 mg total) by mouth  daily. Take on days 1-5 of chemotherapy. 15 tablet 6  . prochlorperazine (COMPAZINE) 10 MG tablet Take 1 tablet (10 mg total) by mouth every 6 (six) hours as needed (Nausea or vomiting). 30 tablet 6  . senna-docusate (SENNA S) 8.6-50 MG tablet Take 2 tablets by mouth at bedtime as needed for mild constipation or moderate constipation. 60 tablet 1  . tiZANidine (ZANAFLEX) 2 MG tablet Take 2 tablets (4 mg total) by mouth every 8 (eight) hours as needed for muscle spasms. 30 tablet 0   No current facility-administered medications for this visit.     REVIEW OF SYSTEMS:    10 Point review of Systems was done is negative except as noted above.  PHYSICAL EXAMINATION: ECOG PERFORMANCE STATUS: 1 - Symptomatic but completely ambulatory  . Vitals:   03/18/17 0941  BP: (!) 143/74  Pulse: 80  Resp: 17  Temp: 98.7 F (37.1 C)   Filed Weights   03/18/17 0941  Weight: 192 lb 6.4 oz (87.3 kg)   .Body mass index is 35.19 kg/m.  GENERAL:alert, in no acute distress and comfortable SKIN: skin color, texture, turgor are normal, no rashes or significant lesions EYES: normal, conjunctiva are pink and non-injected, sclera clear OROPHARYNX:no exudate, no erythema and lips, buccal mucosa, and tongue normal  NECK: supple, no JVD, thyroid normal size, non-tender, without nodularity LYMPH: palpable left supraclavicular and left axillary lymph node. Smaller right axillary lymph node. LUNGS: clear to auscultation with normal respiratory effort HEART: regular rate & rhythm,  no murmurs and no lower extremity edema ABDOMEN: abdomen soft, non-tender, normoactive bowel sounds , mild tenderness to palpation in left upper abdomen. Musculoskeletal: no cyanosis of digits and no clubbing  PSYCH: alert & oriented x 3 with fluent speech NEURO: no focal motor/sensory deficits  LABORATORY DATA:  I have reviewed the data as listed  . CBC Latest Ref Rng & Units 03/18/2017 02/25/2017 02/04/2017  WBC 3.9 - 10.3 10e3/uL  6.1 6.8 5.7  Hemoglobin 11.6 - 15.9 g/dL 10.9(L) 11.3(L) 11.0(L)  Hematocrit 34.8 - 46.6 % 32.2(L) 33.7(L) 33.1(L)  Platelets 145 - 400 10e3/uL 248 277 264   CBC    Component Value Date/Time   WBC 6.1 03/18/2017 0905   WBC 5.0 12/20/2016 1141   RBC 3.49 (L) 03/18/2017 0905   RBC 4.16 12/20/2016 1141   HGB 10.9 (L) 03/18/2017 0905   HCT 32.2 (L) 03/18/2017 0905   PLT 248 03/18/2017 0905   MCV 92.3 03/18/2017 0905   MCH 31.2 03/18/2017 0905   MCH 29.1 12/20/2016 1141   MCHC 33.9 03/18/2017 0905   MCHC 33.8 12/20/2016 1141   RDW 15.8 (H) 03/18/2017 0905   LYMPHSABS 0.5 (L) 03/18/2017 0905   MONOABS 0.3 03/18/2017 0905   EOSABS 0.0 03/18/2017 0905   BASOSABS 0.1 03/18/2017 0905   . CMP Latest Ref Rng & Units 03/18/2017 02/25/2017 02/04/2017  Glucose 70 - 140 mg/dl 162(H) 143(H) 161(H)  BUN 7.0 - 26.0 mg/dL 13.0 12.2 11.0  Creatinine 0.6 - 1.1 mg/dL 0.8 0.7 0.7  Sodium 136 - 145 mEq/L 139 140 141  Potassium 3.5 - 5.1 mEq/L 4.2 4.1 4.1  CO2 22 - 29  mEq/L 24 24 25   Calcium 8.4 - 10.4 mg/dL 9.8 9.8 9.6  Total Protein 6.4 - 8.3 g/dL 7.2 7.5 7.4  Total Bilirubin 0.20 - 1.20 mg/dL 0.43 0.39 0.37  Alkaline Phos 40 - 150 U/L 71 67 73  AST 5 - 34 U/L 16 18 17   ALT 0 - 55 U/L 14 14 16    . Lab Results  Component Value Date   LDH 149 03/18/2017   Component     Latest Ref Rng & Units 11/12/2016  Hep C Virus Ab     0.0 - 0.9 s/co ratio <0.1  Hepatitis B Surface Ag     Negative Negative  Hep B Core Ab, Tot     Negative Negative       *[place]*                  *[facility] [employer].                        [place], [place] [address]                            [phone]  ------------------------------------------------------------------- Transthoracic Echocardiography  Patient:    Kajah, Santizo MR #:       [id number] Study Date: 11/19/2016 Gender:     F Age:        51 Height:     160 cm Weight:     90.2 kg BSA:         2.04 m^2 Pt. Status: Room:   SONOGRAPHER  Tresa Res, RDCS  PERFORMING   [facility], Outpatient  ATTENDING    Roselee Nova     [place], [place] Kishore  REFERRING    [place], [place] Kishore  cc:  ------------------------------------------------------------------- LV EF: 60% -   65%  ------------------------------------------------------------------- Indications:      V58.11 Chemotherapy Evaluation.  ------------------------------------------------------------------- History:   Risk factors:  Lymphoma. Obese.  ------------------------------------------------------------------- Study Conclusions  - Left ventricle: The cavity size was normal. There was mild   concentric hypertrophy. Systolic function was normal. The   estimated ejection fraction was in the range of 60% to 65%. Wall   motion was normal; there were no regional wall motion   abnormalities. Doppler parameters are consistent with abnormal   left ventricular relaxation (grade 1 diastolic dysfunction).   Doppler parameters are consistent with indeterminate ventricular   filling pressure. - Aortic valve: Transvalvular velocity was within the normal range.   There was no stenosis. There was trivial regurgitation. - Mitral valve: Transvalvular velocity was within the normal range.   There was no evidence for stenosis. There was trivial   regurgitation. - Right ventricle: The cavity size was normal. Wall thickness was   normal. Systolic function was normal. - Atrial septum: No defect or patent foramen ovale was identified   by color flow Doppler. - Tricuspid valve: There was no regurgitation. - Global longitudinal strain -17.6%   RADIOGRAPHIC STUDIES: I have personally reviewed the radiological images as listed and agreed with the findings in the report. Nm Pet Image Restag (ps) Skull Base To Thigh  Result Date: 02/21/2017 CLINICAL DATA:  Subsequent treatment strategy for grade 3B follicular lymphoma.  Status post initial chemotherapy. EXAM: NUCLEAR MEDICINE  PET SKULL BASE TO THIGH TECHNIQUE: 9.5 mCi F-18 FDG was injected intravenously. Full-ring PET imaging was performed from the skull base to thigh after the radiotracer. CT data was obtained and used for attenuation correction and anatomic localization. FASTING BLOOD GLUCOSE:  Value: 150 mg/dl COMPARISON:  PET-CT 11/07/2016 FINDINGS: NECK Resolution of hypermetabolic LEFT supraclavicular adenopathy. CHEST Resolution of hypermetabolic axillary lymphadenopathy. No suspicious pulmonary nodules. ABDOMEN/PELVIS Resolution of metabolic activity within the spleen. Activity now normal (less than liver activity). Interval decrease in size and metabolic activity of retroperitoneal adenopathy. Example LEFT periaortic lymph node measures 20 mm (image 121, series 4) compared with 25 mm. Lymph node now has metabolic activity less than mediastinal activity. Bulky lymph nodes to the RIGHT of the aorta also resolved in metabolic activity. Example 10 mm node adjacent the IVC (image 127, series 4) decreased from 21 mm with SUV max less than or equal blood pooled decreased from SUV max 25. SKELETON No evidence of abnormal marrow activity. IMPRESSION: 1. Resolution metabolic activity associated thoracic and periaortic retroperitoneal lymph nodes consistent with positive chemotherapy response ( Deauville 2). 2. Resolution of metabolic activity in the spleen which is now normal (less than liver). 3. No evidence of new or progressive lymphoma. Electronically Signed   By: Suzy Bouchard M.D.   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 Also noted to have left supraclavicular and bilateral axillary left more than  right FDG avid lymphadenopathy. Based on imaging this would represent at least Stage III disease if this were a lymphoma. LDH level is not significantly elevated. Blood counts are stable. PET/CT scan does show fairly active disease which is FDG avid with FDG avid splenomegaly. Patient has some constitutional symptoms with about a 10 pound weight loss and some night sweats. Hepatitis profile negative. No other obvious new focal symptoms.  Patient has had an ECHO which shows nl EF  Patient is status post 4 cycles of R CHOP with no prohibitive toxicities.  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 Most of the symptoms have resolved between treatment cycles. No indications for dose reduction at this time. Plan - Patient's labs are stable today with no overt prohibitive treatment toxicities. -We shall proceed with her 5th planned cycle of R CHOP chemotherapy with Neulasta support.  #3 severe bilateral back spasms ? Flare of osteoarthritis.  Patient had lumbar and lumbosacral x-rays which showed no new osseous lesions. Patient notes no further back pain or back spasms at this time and has not been any pain medications for this. This has resolved..  -please schedule for C6 of R-CHOP in 3 weeks -RTC with Dr Irene Limbo with labs in 3 weeks on C6D1  All of the patients questions were answered with apparent satisfaction. The patient knows to call the clinic with any problems, questions or concerns.  I spent 20 minutes counseling the patient face to face. The total time spent in the appointment was 25 minutes and more than 50% was on counseling and direct patient cares.    Sullivan Lone MD [place] AAHIVMS [facility] Hematology/Oncology Physician [place]  (Office):       [phone] (Work cell):  [phone] (Fax):           [phone]

## 2017-03-18 NOTE — Patient Instructions (Signed)
Thank you for choosing [facility] to provide your oncology and hematology care.  To afford each patient quality time with our providers, please arrive 30 minutes before your scheduled appointment time.  If you arrive late for your appointment, you may be asked to reschedule.  We strive to give you quality time with our providers, and arriving late affects you and other patients whose appointments are after yours.  If you are a no show for multiple scheduled visits, you may be dismissed from the clinic at the providers discretion.   Again, thank you for choosing [facility], our hope is that these requests will decrease the amount of time that you wait before being seen by our physicians.  ______________________________________________________________________ Should you have questions after your visit to the [facility], please contact our office at [phone] between the hours of 8:30 and 4:30 p.m.    Voicemails left after 4:30p.m will not be returned until the following business day.   For prescription refill requests, please have your pharmacy contact us directly.  Please also try to allow 48 hours for prescription requests.   Please contact the scheduling department for questions regarding scheduling.  For scheduling of procedures such as PET scans, CT scans, MRI, Ultrasound, etc please contact central scheduling at [phone].   Resources For Cancer Patients and Caregivers:  American Cancer Society:  800-227-2345  Can help patients locate various types of support and financial assistance Cancer Care: 1-800-813-HOPE (4673) Provides financial assistance, online support groups, medication/co-pay assistance.   [place] DSS:  336-641-3447 Where to apply for food stamps, Medicaid, and utility assistance Medicare Rights Center: 800-333-4114 Helps people with Medicare understand their rights and benefits, navigate the Medicare system, and secure the  quality healthcare they deserve SCAT: 336-333-6589 [employer] [employer]'s shared-ride transportation service for eligible riders who have a disability that prevents them from riding the fixed route bus.   For additional information on assistance programs please contact our social worker:   Grier Hock/Abigail Elmore:  [phone] 

## 2017-03-19 ENCOUNTER — Telehealth: Payer: Self-pay | Admitting: Hematology

## 2017-03-19 NOTE — Telephone Encounter (Signed)
Scheduled appt per 6/26 sch message. - patient is aware of appt time and date.

## 2017-03-26 ENCOUNTER — Encounter: Payer: Self-pay | Admitting: Hematology

## 2017-04-04 NOTE — Telephone Encounter (Signed)
Fax sent with appropriate disability forms. Confirmed fax receipt.

## 2017-04-04 NOTE — Telephone Encounter (Signed)
Internal fax sent with letter of disability for pt on 6/14 and 6/15 so that pt can continue working with the approved limitations documented. Confirmed fax receipt of both letters.

## 2017-04-08 ENCOUNTER — Other Ambulatory Visit (HOSPITAL_BASED_OUTPATIENT_CLINIC_OR_DEPARTMENT_OTHER): Payer: BLUE CROSS/BLUE SHIELD

## 2017-04-08 ENCOUNTER — Ambulatory Visit (HOSPITAL_BASED_OUTPATIENT_CLINIC_OR_DEPARTMENT_OTHER): Payer: BLUE CROSS/BLUE SHIELD

## 2017-04-08 VITALS — BP 115/68 | HR 89 | Temp 98.0°F | Resp 16

## 2017-04-08 DIAGNOSIS — C8248 Follicular lymphoma grade IIIb, lymph nodes of multiple sites: Secondary | ICD-10-CM

## 2017-04-08 DIAGNOSIS — Z5111 Encounter for antineoplastic chemotherapy: Secondary | ICD-10-CM | POA: Diagnosis not present

## 2017-04-08 DIAGNOSIS — Z5112 Encounter for antineoplastic immunotherapy: Secondary | ICD-10-CM

## 2017-04-08 LAB — COMPREHENSIVE METABOLIC PANEL
ALT: 15 U/L (ref 0–55)
ANION GAP: 7 meq/L (ref 3–11)
AST: 18 U/L (ref 5–34)
Albumin: 3.9 g/dL (ref 3.5–5.0)
Alkaline Phosphatase: 71 U/L (ref 40–150)
BUN: 9.8 mg/dL (ref 7.0–26.0)
CHLORIDE: 105 meq/L (ref 98–109)
CO2: 23 meq/L (ref 22–29)
Calcium: 9.7 mg/dL (ref 8.4–10.4)
Creatinine: 0.8 mg/dL (ref 0.6–1.1)
EGFR: 80 mL/min/{1.73_m2} — AB (ref >90)
Glucose: 185 mg/dl — ABNORMAL HIGH (ref 70–140)
Potassium: 4.3 mEq/L (ref 3.5–5.1)
Sodium: 135 mEq/L — ABNORMAL LOW (ref 136–145)
Total Bilirubin: 0.36 mg/dL (ref 0.20–1.20)
Total Protein: 7.2 g/dL (ref 6.4–8.3)

## 2017-04-08 LAB — CBC WITH DIFFERENTIAL/PLATELET
BASO%: 1.1 % (ref 0.0–2.0)
Basophils Absolute: 0.1 10*3/uL (ref 0.0–0.1)
EOS%: 0.8 % (ref 0.0–7.0)
Eosinophils Absolute: 0 10*3/uL (ref 0.0–0.5)
HCT: 31.6 % — ABNORMAL LOW (ref 34.8–46.6)
HGB: 10.7 g/dL — ABNORMAL LOW (ref 11.6–15.9)
LYMPH%: 9.9 % — ABNORMAL LOW (ref 14.0–49.7)
MCH: 32.1 pg (ref 25.1–34.0)
MCHC: 33.9 g/dL (ref 31.5–36.0)
MCV: 94.9 fL (ref 79.5–101.0)
MONO#: 0.1 10*3/uL (ref 0.1–0.9)
MONO%: 2.7 % (ref 0.0–14.0)
NEUT#: 4.5 10*3/uL (ref 1.5–6.5)
NEUT%: 85.5 % — AB (ref 38.4–76.8)
PLATELETS: 239 10*3/uL (ref 145–400)
RBC: 3.33 10*6/uL — AB (ref 3.70–5.45)
RDW: 14.3 % (ref 11.2–14.5)
WBC: 5.2 10*3/uL (ref 3.9–10.3)
lymph#: 0.5 10*3/uL — ABNORMAL LOW (ref 0.9–3.3)

## 2017-04-08 MED ORDER — DEXAMETHASONE SODIUM PHOSPHATE 10 MG/ML IJ SOLN
INTRAMUSCULAR | Status: AC
  Filled 2017-04-08: qty 1

## 2017-04-08 MED ORDER — SODIUM CHLORIDE 0.9 % IV SOLN
375.0000 mg/m2 | Freq: Once | INTRAVENOUS | Status: AC
  Administered 2017-04-08: 700 mg via INTRAVENOUS
  Filled 2017-04-08: qty 50

## 2017-04-08 MED ORDER — PALONOSETRON HCL INJECTION 0.25 MG/5ML
0.2500 mg | Freq: Once | INTRAVENOUS | Status: AC
Start: 2017-04-08 — End: 2017-04-08
  Administered 2017-04-08: 0.25 mg via INTRAVENOUS

## 2017-04-08 MED ORDER — SODIUM CHLORIDE 0.9% FLUSH
10.0000 mL | INTRAVENOUS | Status: DC | PRN
  Administered 2017-04-08: 10 mL
  Filled 2017-04-08: qty 10

## 2017-04-08 MED ORDER — ACETAMINOPHEN 325 MG PO TABS
ORAL_TABLET | ORAL | Status: AC
Start: 2017-04-08 — End: 2017-04-08
  Filled 2017-04-08: qty 2

## 2017-04-08 MED ORDER — PEGFILGRASTIM 6 MG/0.6ML ~~LOC~~ PSKT
6.0000 mg | PREFILLED_SYRINGE | Freq: Once | SUBCUTANEOUS | Status: AC
  Administered 2017-04-08: 6 mg via SUBCUTANEOUS
  Filled 2017-04-08: qty 0.6

## 2017-04-08 MED ORDER — DIPHENHYDRAMINE HCL 25 MG PO CAPS
ORAL_CAPSULE | ORAL | Status: AC
  Filled 2017-04-08: qty 2

## 2017-04-08 MED ORDER — CYCLOPHOSPHAMIDE CHEMO INJECTION 1 GM
750.0000 mg/m2 | Freq: Once | INTRAMUSCULAR | Status: AC
  Administered 2017-04-08: 1480 mg via INTRAVENOUS
  Filled 2017-04-08: qty 74

## 2017-04-08 MED ORDER — DOXORUBICIN HCL CHEMO IV INJECTION 2 MG/ML
50.0000 mg/m2 | Freq: Once | INTRAVENOUS | Status: AC
  Administered 2017-04-08: 98 mg via INTRAVENOUS
  Filled 2017-04-08: qty 49

## 2017-04-08 MED ORDER — ACETAMINOPHEN 325 MG PO TABS
650.0000 mg | ORAL_TABLET | Freq: Once | ORAL | Status: AC
  Administered 2017-04-08: 650 mg via ORAL

## 2017-04-08 MED ORDER — PALONOSETRON HCL INJECTION 0.25 MG/5ML
INTRAVENOUS | Status: AC
  Filled 2017-04-08: qty 5

## 2017-04-08 MED ORDER — VINCRISTINE SULFATE CHEMO INJECTION 1 MG/ML
2.0000 mg | Freq: Once | INTRAVENOUS | Status: AC
  Administered 2017-04-08: 2 mg via INTRAVENOUS
  Filled 2017-04-08: qty 2

## 2017-04-08 MED ORDER — HEPARIN SOD (PORK) LOCK FLUSH 100 UNIT/ML IV SOLN
500.0000 [IU] | Freq: Once | INTRAVENOUS | Status: AC | PRN
  Administered 2017-04-08: 500 [IU]
  Filled 2017-04-08: qty 5

## 2017-04-08 MED ORDER — DIPHENHYDRAMINE HCL 25 MG PO CAPS
50.0000 mg | ORAL_CAPSULE | Freq: Once | ORAL | Status: AC
  Administered 2017-04-08: 50 mg via ORAL

## 2017-04-08 MED ORDER — SODIUM CHLORIDE 0.9 % IV SOLN
Freq: Once | INTRAVENOUS | Status: AC
  Administered 2017-04-08: 11:00:00 via INTRAVENOUS

## 2017-04-08 MED ORDER — DEXAMETHASONE SODIUM PHOSPHATE 10 MG/ML IJ SOLN
10.0000 mg | Freq: Once | INTRAMUSCULAR | Status: AC
  Administered 2017-04-08: 10 mg via INTRAVENOUS

## 2017-04-08 NOTE — Patient Instructions (Signed)
[facility] Discharge Instructions for Patients Receiving Chemotherapy  Today you received the following chemotherapy agents: Adriamycin, Vincristine, Cytoxan and Rituxan   To help prevent nausea and vomiting after your treatment, we encourage you to take your nausea medication as directed.    If you develop nausea and vomiting that is not controlled by your nausea medication, call the clinic.   BELOW ARE SYMPTOMS THAT SHOULD BE REPORTED IMMEDIATELY:  *FEVER GREATER THAN 100.5 F  *CHILLS WITH OR WITHOUT FEVER  NAUSEA AND VOMITING THAT IS NOT CONTROLLED WITH YOUR NAUSEA MEDICATION  *UNUSUAL SHORTNESS OF BREATH  *UNUSUAL BRUISING OR BLEEDING  TENDERNESS IN MOUTH AND THROAT WITH OR WITHOUT PRESENCE OF ULCERS  *URINARY PROBLEMS  *BOWEL PROBLEMS  UNUSUAL RASH Items with * indicate a potential emergency and should be followed up as soon as possible.  Feel free to call the clinic you have any questions or concerns. The clinic phone number is [phone].  Please show the [place] at check-in to the Emergency Department and triage nurse.

## 2017-04-09 ENCOUNTER — Encounter: Payer: Self-pay | Admitting: Hematology

## 2017-04-10 ENCOUNTER — Other Ambulatory Visit: Payer: Self-pay | Admitting: Hematology

## 2017-04-10 ENCOUNTER — Other Ambulatory Visit: Payer: Self-pay | Admitting: *Deleted

## 2017-04-10 DIAGNOSIS — C8580 Other specified types of non-Hodgkin lymphoma, unspecified site: Secondary | ICD-10-CM

## 2017-04-10 DIAGNOSIS — C8248 Follicular lymphoma grade IIIb, lymph nodes of multiple sites: Secondary | ICD-10-CM

## 2017-04-11 ENCOUNTER — Telehealth: Payer: Self-pay | Admitting: Hematology

## 2017-04-11 NOTE — Telephone Encounter (Signed)
Scheduled appt per sch message from [facility] -left  Message with appt date and time.

## 2017-04-15 ENCOUNTER — Encounter: Payer: Self-pay | Admitting: Hematology

## 2017-04-16 ENCOUNTER — Other Ambulatory Visit: Payer: Self-pay

## 2017-04-16 ENCOUNTER — Telehealth: Payer: Self-pay

## 2017-04-16 NOTE — Telephone Encounter (Signed)
Spoke with pt. Understands that CBC with diff will be drawn at next appt, but not vitamin B. Pt to expect call from scheduling tomorrow for PET Scan, which should be completed prior to appt on 8/10 with physician. Pt wondering about options based on PET Scan results. Explained that Dr. Irene Limbo would speak with her about that at there appt once we have the review of the scan. Pt verbalized understanding.

## 2017-04-29 ENCOUNTER — Ambulatory Visit (HOSPITAL_COMMUNITY)
Admission: RE | Admit: 2017-04-29 | Discharge: 2017-04-29 | Disposition: A | Discharge status: Home / Self Care | Payer: BLUE CROSS/BLUE SHIELD | Source: Ambulatory Visit | Attending: Hematology | Admitting: Hematology

## 2017-04-29 DIAGNOSIS — R590 Localized enlarged lymph nodes: Secondary | ICD-10-CM | POA: Diagnosis not present

## 2017-04-29 DIAGNOSIS — C8580 Other specified types of non-Hodgkin lymphoma, unspecified site: Secondary | ICD-10-CM

## 2017-04-29 LAB — GLUCOSE, CAPILLARY: GLUCOSE-CAPILLARY: 154 mg/dL — AB (ref 65–99)

## 2017-04-29 MED ORDER — FLUDEOXYGLUCOSE F - 18 (FDG) INJECTION
9.4000 | Freq: Once | INTRAVENOUS | Status: AC | PRN
  Administered 2017-04-29: 9.4 via INTRAVENOUS

## 2017-05-02 ENCOUNTER — Ambulatory Visit (HOSPITAL_BASED_OUTPATIENT_CLINIC_OR_DEPARTMENT_OTHER): Payer: BLUE CROSS/BLUE SHIELD | Admitting: Hematology

## 2017-05-02 ENCOUNTER — Encounter: Payer: Self-pay | Admitting: Hematology

## 2017-05-02 ENCOUNTER — Telehealth: Payer: Self-pay | Admitting: Hematology

## 2017-05-02 ENCOUNTER — Other Ambulatory Visit (HOSPITAL_BASED_OUTPATIENT_CLINIC_OR_DEPARTMENT_OTHER): Payer: BLUE CROSS/BLUE SHIELD

## 2017-05-02 VITALS — BP 143/71 | HR 81 | Temp 98.3°F | Resp 18 | Ht 62.0 in | Wt 191.7 lb

## 2017-05-02 DIAGNOSIS — C8248 Follicular lymphoma grade IIIb, lymph nodes of multiple sites: Secondary | ICD-10-CM

## 2017-05-02 DIAGNOSIS — R530 Neoplastic (malignant) related fatigue: Secondary | ICD-10-CM

## 2017-05-02 DIAGNOSIS — T451X5A Adverse effect of antineoplastic and immunosuppressive drugs, initial encounter: Secondary | ICD-10-CM

## 2017-05-02 DIAGNOSIS — G620 Drug-induced polyneuropathy: Secondary | ICD-10-CM

## 2017-05-02 DIAGNOSIS — R5383 Other fatigue: Secondary | ICD-10-CM

## 2017-05-02 DIAGNOSIS — M62830 Muscle spasm of back: Secondary | ICD-10-CM | POA: Diagnosis not present

## 2017-05-02 DIAGNOSIS — Z23 Encounter for immunization: Secondary | ICD-10-CM

## 2017-05-02 LAB — COMPREHENSIVE METABOLIC PANEL
ALBUMIN: 3.9 g/dL (ref 3.5–5.0)
ALT: 18 U/L (ref 0–55)
ANION GAP: 10 meq/L (ref 3–11)
AST: 22 U/L (ref 5–34)
Alkaline Phosphatase: 70 U/L (ref 40–150)
BUN: 11 mg/dL (ref 7.0–26.0)
CALCIUM: 10 mg/dL (ref 8.4–10.4)
CHLORIDE: 104 meq/L (ref 98–109)
CO2: 27 meq/L (ref 22–29)
CREATININE: 0.8 mg/dL (ref 0.6–1.1)
EGFR: 78 mL/min/{1.73_m2} — AB (ref >90)
Glucose: 170 mg/dl — ABNORMAL HIGH (ref 70–140)
Potassium: 4.1 mEq/L (ref 3.5–5.1)
SODIUM: 140 meq/L (ref 136–145)
TOTAL PROTEIN: 7.3 g/dL (ref 6.4–8.3)
Total Bilirubin: 0.52 mg/dL (ref 0.20–1.20)

## 2017-05-02 LAB — CBC WITH DIFFERENTIAL/PLATELET
BASO%: 1.3 % (ref 0.0–2.0)
BASOS ABS: 0.1 10*3/uL (ref 0.0–0.1)
EOS ABS: 0.1 10*3/uL (ref 0.0–0.5)
EOS%: 1.8 % (ref 0.0–7.0)
HEMATOCRIT: 34.4 % — AB (ref 34.8–46.6)
HEMOGLOBIN: 11.7 g/dL (ref 11.6–15.9)
LYMPH#: 0.8 10*3/uL — AB (ref 0.9–3.3)
LYMPH%: 17.1 % (ref 14.0–49.7)
MCH: 31.9 pg (ref 25.1–34.0)
MCHC: 34 g/dL (ref 31.5–36.0)
MCV: 93.7 fL (ref 79.5–101.0)
MONO#: 0.5 10*3/uL (ref 0.1–0.9)
MONO%: 10.2 % (ref 0.0–14.0)
NEUT%: 69.6 % (ref 38.4–76.8)
NEUTROS ABS: 3.1 10*3/uL (ref 1.5–6.5)
PLATELETS: 217 10*3/uL (ref 145–400)
RBC: 3.67 10*6/uL — ABNORMAL LOW (ref 3.70–5.45)
RDW: 13.4 % (ref 11.2–14.5)
WBC: 4.5 10*3/uL (ref 3.9–10.3)

## 2017-05-02 LAB — LACTATE DEHYDROGENASE: LDH: 149 U/L (ref 125–245)

## 2017-05-02 MED ORDER — PNEUMOCOCCAL 13-VAL CONJ VACC IM SUSP
0.5000 mL | Freq: Once | INTRAMUSCULAR | Status: AC
  Administered 2017-05-02: 0.5 mL via INTRAMUSCULAR
  Filled 2017-05-02: qty 0.5

## 2017-05-02 NOTE — Patient Instructions (Addendum)
Thank you for choosing [facility] to provide your oncology and hematology care.  To afford each patient quality time with our providers, please arrive 30 minutes before your scheduled appointment time.  If you arrive late for your appointment, you may be asked to reschedule.  We strive to give you quality time with our providers, and arriving late affects you and other patients whose appointments are after yours.   If you are a no show for multiple scheduled visits, you may be dismissed from the clinic at the providers discretion.    Again, thank you for choosing [facility], our hope is that these requests will decrease the amount of time that you wait before being seen by our physicians.  ______________________________________________________________________  Should you have questions after your visit to the [facility], please contact our office at [phone] between the hours of 8:30 and 4:30 p.m.    Voicemails left after 4:30p.m will not be returned until the following business day.    For prescription refill requests, please have your pharmacy contact us directly.  Please also try to allow 48 hours for prescription requests.    Please contact the scheduling department for questions regarding scheduling.  For scheduling of procedures such as PET scans, CT scans, MRI, Ultrasound, etc please contact central scheduling at [phone].    Resources For Cancer Patients and Caregivers:   [employer]colink.org:  A wonderful resource for patients and healthcare providers for information regarding your disease, ways to tract your treatment, what to expect, etc.     [place]:  [phone]  Can help patients locate various types of support and financial assistance  Cancer Care: 1-800-813-HOPE (773)822-8614) Provides financial assistance, online support groups, medication/co-pay assistance.    [place]:  (508)566-2542 Where to apply for food  stamps, Medicaid, and utility assistance  Medicare Rights Center: 984-652-2982 Helps people with Medicare understand their rights and benefits, navigate the Medicare system, and secure the quality healthcare they deserve  [place]: [place] [employer]'s shared-ride transportation service for eligible riders who have a disability that prevents them from riding the fixed route bus.    For additional information on assistance programs please contact our social worker:   [place]:  [phone]   Pneumococcal Conjugate Vaccine suspension for injection What is this medicine? PNEUMOCOCCAL VACCINE (NEU mo KOK al vak SEEN) is a vaccine used to prevent pneumococcus bacterial infections. These bacteria can cause serious infections like pneumonia, meningitis, and blood infections. This vaccine will lower your chance of getting pneumonia. If you do get pneumonia, it can make your symptoms milder and your illness shorter. This vaccine will not treat an infection and will not cause infection. This vaccine is recommended for infants and young children, adults with certain medical conditions, and adults 1 years or older. This medicine may be used for other purposes; ask your health care provider or pharmacist if you have questions. COMMON BRAND NAME(S): Prevnar, Prevnar 13 What should I tell my health care provider before I take this medicine? They need to know if you have any of these conditions: -bleeding problems -fever -immune system problems -an unusual or allergic reaction to pneumococcal vaccine, diphtheria toxoid, other vaccines, latex, other medicines, foods, dyes, or preservatives -pregnant or trying to get pregnant -breast-feeding How should I use this medicine? This vaccine is for injection into a muscle. It is given by a health care professional. A copy of Vaccine Information Statements will be given  before each vaccination. Read this sheet carefully each  time. The sheet may change frequently. Talk to your pediatrician regarding the use of this medicine in children. While this drug may be prescribed for children as young as 15 weeks old for selected conditions, precautions do apply. Overdosage: If you think you have taken too much of this medicine contact a poison control center or emergency room at once. NOTE: This medicine is only for you. Do not share this medicine with others. What if I miss a dose? It is important not to miss your dose. Call your doctor or health care professional if you are unable to keep an appointment. What may interact with this medicine? -medicines for cancer chemotherapy -medicines that suppress your immune function -steroid medicines like prednisone or cortisone This list may not describe all possible interactions. Give your health care provider a list of all the medicines, herbs, non-prescription drugs, or dietary supplements you use. Also tell them if you smoke, drink alcohol, or use illegal drugs. Some items may interact with your medicine. What should I watch for while using this medicine? Mild fever and pain should go away in 3 days or less. Report any unusual symptoms to your doctor or health care professional. What side effects may I notice from receiving this medicine? Side effects that you should report to your doctor or health care professional as soon as possible: -allergic reactions like skin rash, itching or hives, swelling of the face, lips, or tongue -breathing problems -confused -fast or irregular heartbeat -fever over 102 degrees F -seizures -unusual bleeding or bruising -unusual muscle weakness Side effects that usually do not require medical attention (report to your doctor or health care professional if they continue or are bothersome): -aches and pains -diarrhea -fever of 102 degrees F or less -headache -irritable -loss of appetite -pain, tender at site where injected -trouble  sleeping This list may not describe all possible side effects. Call your doctor for medical advice about side effects. You may report side effects to FDA at 1-800-FDA-1088. Where should I keep my medicine? This does not apply. This vaccine is given in a clinic, pharmacy, doctor's office, or other health care setting and will not be stored at home. NOTE: This sheet is a summary. It may not cover all possible information. If you have questions about this medicine, talk to your doctor, pharmacist, or health care provider.  2018 Elsevier/Gold Standard (2014-06-16 10:27:27)

## 2017-05-02 NOTE — Progress Notes (Signed)
HEMATOLOGY/ONCOLOGY CLINIC NOTE  Date of Service: .05/02/2017  Patient Care Team: Katelyn Noble, MD as PCP - General (Internal Medicine)  CHIEF COMPLAINTS/PURPOSE OF CONSULTATION:   f/u for high grade follicular lymphoma  HISTORY OF PRESENTING ILLNESS:   Katelyn Reeves is a wonderful 66 y.o. female who has been referred to [place] by Dr .Katelyn Noble, MD for evaluation and management of suspected non-Hodgkin's lymphoma.  Patient notes that she presented with back pain for 4-6 weeks and eventually had pain radiating to her right inguinal area. She notes that she has had interstitial cystitis in the past. She was thought to have a urinary tract infection and was treated with Macrodantin. Says the pain got worse and started radiating into the right groin she had a CT of the abdomen renal stone protocol on 10/25/2016 which showed extensive retroperitoneal lymphadenopathy concerning for lymphoproliferative disorder. She was incidentally noted to have a nodular border of the liver with possible liver cirrhosis. No urinary stones noted.  She subsequently had a PET CT scan ordered by her primary care physician which was done on 11/07/2016. This showed diffuse hypermetabolic spleen with mild splenomegaly. Hypermetabolic adenopathy in the left supraclavicular, bilateral axillary, peripancreatic, gastrohepatic ligament, celiac, porta hepatis, periaortic, and left external iliac chains. Hypermetabolic mesenteric lymph nodes. Appearance favors lymphoma.  Patient notes some night sweats for 2 weeks. Has lost about 10 pounds in the last month. No acute fevers or chills. Notes the back pain is controlled with when necessary Vicodin.  INTERVAL HISTORY  Katelyn Reeves is here for her scheduled follow-up after completion of 6 cycles of R-CHOP chemotherapy with a reevaluation PET CT scan. She has no evidence of metabolically active disease. Notes grade 1 to treatment-related fatigue and grade 1 neuropathy. Would like to  take them. B complex which is reasonable to do. Does not feel the neuropathy is bothersome enough to consider using Cymbalta. Is agreeable to getting a referral to cancer therapy rehabilitation. No fevers no chills no night sweats. Counseled on continued infection prevention strategies. Patient notes that she has never had pneumococcal vaccination. We will proceed with Prevnar vaccination today and pneumococcal conjugate vaccination in 2 months. She will get the flu shot when available. She would like to keep the port in at this time. We discussed pros and cons of maintenance Rituxan and she would like to proceed with this.   MEDICAL HISTORY:  #1 fibrocystic disease of the breast #2 hypertension #3 constipation #4 duplicated right sided ureters #5 imaging concern for possible liver cirrhosis  SURGICAL HISTORY:  #1 total abdominal hysterectomy with bilateral salpingo-oophorectomy for ovarian cyst. #2 Lipoma removal left upper back #3 hemorrhoidectomy  SOCIAL HISTORY: Social History   Social History  . Marital status: Divorced    Spouse name: N/A  . Number of children: N/A  . Years of education: N/A   Occupational History  . Not on file.   Social History Main Topics  . Smoking status: Never Smoker  . Smokeless tobacco: Never Used  . Alcohol use No  . Drug use: No  . Sexual activity: Not on file   Other Topics Concern  . Not on file   Social History Narrative  . No narrative on file  Nonsmoker no issues with alcohol use or drug use. 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   FAMILY HISTORY:  Notes her mother had ovarian cancer followed by leukemia and died at age 69 years Maternal aunt gastric  cancer  ALLERGIES:  is allergic to sulfa antibiotics.  MEDICATIONS:  Current Outpatient Prescriptions  Medication Sig Dispense Refill  . pyridOXINE (VITAMIN B-6) 100 MG tablet Take 100 mg by mouth daily.    Marland Kitchen lidocaine-prilocaine  (EMLA) cream Apply to affected area once (Patient not taking: Reported on 05/02/2017) 30 g 3  . ondansetron (ZOFRAN) 8 MG tablet Take 1 tablet (8 mg total) by mouth 2 (two) times daily as needed for refractory nausea / vomiting. Start on day 3 after cyclophosphamide. (Patient not taking: Reported on 05/02/2017) 30 tablet 1  . predniSONE (DELTASONE) 20 MG tablet Take 3 tablets (60 mg total) by mouth daily. Take on days 1-5 of chemotherapy. (Patient not taking: Reported on 05/02/2017) 15 tablet 6  . prochlorperazine (COMPAZINE) 10 MG tablet Take 1 tablet (10 mg total) by mouth every 6 (six) hours as needed (Nausea or vomiting). (Patient not taking: Reported on 05/02/2017) 30 tablet 6  . senna-docusate (SENNA S) 8.6-50 MG tablet Take 2 tablets by mouth at bedtime as needed for mild constipation or moderate constipation. (Patient not taking: Reported on 05/02/2017) 60 tablet 1   No current facility-administered medications for this visit.     REVIEW OF SYSTEMS:    10 Point review of Systems was done is negative except as noted above.  PHYSICAL EXAMINATION: ECOG PERFORMANCE STATUS: 1 - Symptomatic but completely ambulatory  . Vitals:   05/02/17 1030  BP: (!) 143/71  Pulse: 81  Resp: 18  Temp: 98.3 F (36.8 C)  SpO2: 99%   Filed Weights   05/02/17 1030  Weight: 191 lb 11.2 oz (87 kg)   .Body mass index is 35.06 kg/m.  GENERAL:alert, in no acute distress and comfortable SKIN: skin color, texture, turgor are normal, no rashes or significant lesions EYES: normal, conjunctiva are pink and non-injected, sclera clear OROPHARYNX:no exudate, no erythema and lips, buccal mucosa, and tongue normal  NECK: supple, no JVD, thyroid normal size, non-tender, without nodularity LYMPH: palpable left supraclavicular and left axillary lymph node. Smaller right axillary lymph node. LUNGS: clear to auscultation with normal respiratory effort HEART: regular rate & rhythm,  no murmurs and no lower extremity  edema ABDOMEN: abdomen soft, non-tender, normoactive bowel sounds , mild tenderness to palpation in left upper abdomen. Musculoskeletal: no cyanosis of digits and no clubbing  PSYCH: alert & oriented x 3 with fluent speech NEURO: no focal motor/sensory deficits  LABORATORY DATA:  I have reviewed the data as listed  . CBC Latest Ref Rng & Units 05/02/2017 04/08/2017 03/18/2017  WBC 3.9 - 10.3 10e3/uL 4.5 5.2 6.1  Hemoglobin 11.6 - 15.9 g/dL 11.7 10.7(L) 10.9(L)  Hematocrit 34.8 - 46.6 % 34.4(L) 31.6(L) 32.2(L)  Platelets 145 - 400 10e3/uL 217 239 248   CBC    Component Value Date/Time   WBC 4.5 05/02/2017 1015   WBC 5.0 12/20/2016 1141   RBC 3.67 (L) 05/02/2017 1015   RBC 4.16 12/20/2016 1141   HGB 11.7 05/02/2017 1015   HCT 34.4 (L) 05/02/2017 1015   PLT 217 05/02/2017 1015   MCV 93.7 05/02/2017 1015   MCH 31.9 05/02/2017 1015   MCH 29.1 12/20/2016 1141   MCHC 34.0 05/02/2017 1015   MCHC 33.8 12/20/2016 1141   RDW 13.4 05/02/2017 1015   LYMPHSABS 0.8 (L) 05/02/2017 1015   MONOABS 0.5 05/02/2017 1015   EOSABS 0.1 05/02/2017 1015   BASOSABS 0.1 05/02/2017 1015   . CMP Latest Ref Rng & Units 05/02/2017 04/08/2017 03/18/2017  Glucose 70 -  140 mg/dl 170(H) 185(H) 162(H)  BUN 7.0 - 26.0 mg/dL 11.0 9.8 13.0  Creatinine 0.6 - 1.1 mg/dL 0.8 0.8 0.8  Sodium 136 - 145 mEq/L 140 135(L) 139  Potassium 3.5 - 5.1 mEq/L 4.1 4.3 4.2  CO2 22 - 29 mEq/L 27 23 24   Calcium 8.4 - 10.4 mg/dL 10.0 9.7 9.8  Total Protein 6.4 - 8.3 g/dL 7.3 7.2 7.2  Total Bilirubin 0.20 - 1.20 mg/dL 0.52 0.36 0.43  Alkaline Phos 40 - 150 U/L 70 71 71  AST 5 - 34 U/L 22 18 16   ALT 0 - 55 U/L 18 15 14    . Lab Results  Component Value Date   LDH 149 05/02/2017   Component     Latest Ref Rng & Units 11/12/2016  Hep C Virus Ab     0.0 - 0.9 s/co ratio <0.1  Hepatitis B Surface Ag     Negative Negative  Hep B Core Ab, Tot     Negative Negative       *[facility]*                  *[facility] [employer].                        [place], [place] [address]                            [phone]  ------------------------------------------------------------------- Transthoracic Echocardiography  Patient:    Vaishnavi, Dalby MR #:       [id number] Study Date: 11/19/2016 Gender:     F Age:        63 Height:     160 cm Weight:     90.2 kg BSA:        2.04 m^2 Pt. Status: Room:   SONOGRAPHER  Tresa Res, RDCS  PERFORMING   [facility], Outpatient  ATTENDING    Roselee Nova     [place], [place]  REFERRING    [place], [place]  cc:  ------------------------------------------------------------------- LV EF: 60% -   65%  ------------------------------------------------------------------- Indications:      V58.11 Chemotherapy Evaluation.  ------------------------------------------------------------------- History:   Risk factors:  Lymphoma. Obese.  ------------------------------------------------------------------- Study Conclusions  - Left ventricle: The cavity size was normal. There was mild   concentric hypertrophy. Systolic function was normal. The   estimated ejection fraction was in the range of 60% to 65%. Wall   motion was normal; there were no regional wall motion   abnormalities. Doppler parameters are consistent with abnormal   left ventricular relaxation (grade 1 diastolic dysfunction).   Doppler parameters are consistent with indeterminate ventricular   filling pressure. - Aortic valve: Transvalvular velocity was within the normal range.   There was no stenosis. There was trivial regurgitation. - Mitral valve: Transvalvular velocity was within the normal range.   There was no evidence for stenosis. There was trivial   regurgitation. - Right ventricle: The cavity size was normal. Wall thickness was   normal. Systolic function was normal. - Atrial septum: No defect or patent foramen ovale was  identified   by color flow Doppler. - Tricuspid valve: There was no regurgitation. - Global longitudinal strain -17.6%   RADIOGRAPHIC STUDIES: I have  personally reviewed the radiological images as listed and agreed with the findings in the report. Nm Pet Image Restag (ps) Skull Base To Thigh  Result Date: 04/29/2017 CLINICAL DATA:  Subsequent treatment strategy for large-cell lymphoma. EXAM: There is some slight nodularity to the liver margin raising the possibility of early cirrhosis. Aortoiliac atherosclerotic vascular disease. NUCLEAR MEDICINE PET SKULL BASE TO THIGH TECHNIQUE: 9.4 mCi F-18 FDG was injected intravenously. Full-ring PET imaging was performed from the skull base to thigh after the radiotracer. CT data was obtained and used for attenuation correction and anatomic localization. FASTING BLOOD GLUCOSE:  Value: 154 mg/dl COMPARISON:  Multiple exams, including 02/21/2017 FINDINGS: NECK No hypermetabolic lymph nodes in the neck. CHEST No hypermetabolic mediastinal or hilar nodes. No suspicious pulmonary nodules on the CT data. At the sites of prior upper paratracheal, supraclavicular, and left axillary lymph nodes are is no significant residual nodal abnormality or hypermetabolic activity. This represents Deauville 1, resolved activity. ABDOMEN/PELVIS No recurrent abnormal splenic activity. Left periaortic and aortocaval adenopathy observed with a left periaortic lymph node measuring 1.3 cm in short axis on image 117/4 (previously 2.0 cm) with maximum standard uptake value 2.2 (formerly 2.3) and they or aortocaval node measuring 1.3 cm in short axis on image 124/4 (formerly 1.4 cm by my measurements) with maximum SUV 2.7 (formerly 3.1). By way of comparison, background mediastinal blood pool activity is SUV 3.1 and background hepatic activity has SUV 3.9. A former left external iliac lymph that is no longer visible and has resolved. SKELETON No focal hypermetabolic activity to suggest skeletal  metastasis. Bridging spurring of the left sacroiliac joint. Lower lumbar spondylosis. IMPRESSION: 1. There is some residual retroperitoneal adenopathy with Deauville 2 activity, although with continued improvement in size and metabolic activity. The prior abnormal activity in the chest and pelvis has resolved (Deauville 1). No new abnormal activity. Electronically Signed   By: Van Clines M.D.   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 Also noted to have left supraclavicular and bilateral axillary left more than right FDG avid lymphadenopathy. Based on imaging this would represent at least Stage III disease if this were a lymphoma. LDH level is not significantly elevated. Blood counts are stable. PET/CT scan does show fairly active disease which is FDG avid with FDG avid splenomegaly. Patient has some constitutional symptoms with about a 10 pound weight loss and some night sweats. Hepatitis profile negative. No other obvious new focal symptoms.  Patient has had an ECHO which shows nl EF  Patient is status post 6 cycles of R CHOP with no prohibitive toxicities.  PET/CT on 8/72018 - showed complete metabolic response with no metabolically active disease. A few borderline lymph nodes in the retroperitoneum.  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 Patient notes is tolerable and would not like to consider Cymbalta at this time. She will empirically use B complex 1 tablet daily.  #3 Grade 2 fatigue related to cancer chemotherapy. Plan -PET/CT scan results were discussed in details with the patient. No evidence of metabolically active disease at this time. -We discussed pros and cons of maintenance Rituxan and  she is agreeable with this. We will schedule her for maintenance Rituxan every 60 days. -Prevnar vaccine today  Cancer rehab referral for strength and endurance training Pneumococcal conjugate vaccine in 2 months Port flush q4weeks Maintenance Rituxan every 60 days (plz schedule first 3 doses) RTC with Dr Irene Limbo in 60 days with 1st dose of Maintenance Rituxan with labs   #3 severe bilateral back spasms ? Flare of osteoarthritis.  Patient had lumbar and lumbosacral x-rays which showed no new osseous lesions. Patient notes no further back pain or back spasms at this time and has not been any pain medications for this. This has resolved..   All of the patients questions were answered with apparent satisfaction. The patient knows to call the clinic with any problems, questions or concerns.  I spent 30 minutes counseling the patient face to face. The total time spent in the appointment was 40 minutes and more than 50% was on counseling and direct patient cares.    Sullivan Lone MD [place] AAHIVMS [facility] Hematology/Oncology Physician [facility]  (Office):       [phone] (Work cell):  [phone] (Fax):           [phone]

## 2017-05-02 NOTE — Telephone Encounter (Signed)
Scheduled appt per 8/10 los - Gave patient AVS and calender per los.

## 2017-05-02 NOTE — Progress Notes (Signed)
Per pt request, disability paper work dropped off in managed care bin 05/02/17 @ 1145am.  Pt aware process takes 7-10 days.

## 2017-05-13 ENCOUNTER — Ambulatory Visit: Payer: BLUE CROSS/BLUE SHIELD | Attending: Hematology | Admitting: Physical Therapy

## 2017-05-13 ENCOUNTER — Encounter: Payer: Self-pay | Admitting: Physical Therapy

## 2017-05-13 DIAGNOSIS — M6281 Muscle weakness (generalized): Secondary | ICD-10-CM | POA: Insufficient documentation

## 2017-05-13 DIAGNOSIS — R262 Difficulty in walking, not elsewhere classified: Secondary | ICD-10-CM | POA: Insufficient documentation

## 2017-05-13 NOTE — Addendum Note (Signed)
Addended by: Manus Gunning L on: 05/13/2017 05:26 PM   Modules accepted: Orders

## 2017-05-13 NOTE — Therapy (Signed)
[place] Reeves, [place], [address] Phone: [phone]   Fax:  [phone]  Physical Therapy Evaluation  Patient Details  Name: Katelyn Reeves MRN: [id number] Date of Birth: 07-Oct-1950 Referring Provider: Irene Limbo  Encounter Date: 05/13/2017    Past Medical History:  Diagnosis Date  . Back pain   . Headache    migraines  . Swollen lymph nodes    left axilla, rectoperitonal     Past Surgical History:  Procedure Laterality Date  . ABDOMINAL HYSTERECTOMY  1980   total abdominal hysterectomy with tubes and ovaries  . BREAST SURGERY Left 1980's   breast biopsy  . CYST EXCISION Left 2017   left scapula  . HEMORRHOID SURGERY  1970's  . IR GENERIC HISTORICAL  12/20/2016   IR FLUORO GUIDE PORT INSERTION RIGHT 12/20/2016 Markus Daft, MD [facility]  . IR GENERIC HISTORICAL  12/20/2016   IR US GUIDE VASC ACCESS RIGHT 12/20/2016 Markus Daft, MD [facility]  . TUBAL LIGATION  1980's   before hysterectomy  . WRIST GANGLION EXCISION Left 1970's    There were no vitals filed for this visit.       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 I had 6 treatments of chemotherapy and the last one was the 10th of August. I felt weaker after each treatment. I had to take prednisone after each treatment which helped at first but then I just felt exhuasted and more and more weak and the prednisone did not help. My job requires that I travel my plane each week. I am on the road at least every 3 out of 5 days. 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 I am currently under restrictions that require I can not travel over 3 hours. He said that because I am immunosupressed and he didn't want me on an airplane. Now I am cleared with no restrictions and I do not feel like I have the stamina  to hop on a plane every week for my job.    Pertinent History Non hodgkins lymphoma, presented with back pain for 4-6 weeks and eventually had pain radiating to her right inguinal area, she had a CT of the abdomen renal stone protocol on 10/25/2016 which showed extensive retroperitoneal lymphadenopathy concerning for lymphoproliferative disorder, She was incidentally noted to have a nodular border of the liver with possible liver cirrhosis, She subsequently had a PET CT scan ordered by her primary care physician which was done on 11/07/2016. This showed diffuse hypermetabolic spleen with mild splenomegaly. Hypermetabolic adenopathy in the left supraclavicular, bilateral axillary, peripancreatic, gastrohepatic ligament, celiac, porta hepatis, periaortic, and left external iliac chains. Hypermetabolic mesenteric lymph nodes. Appearance favors lymphoma   How long can you walk comfortably? 2 or 3 minutes   Patient Stated Goals to be able to work at my full capacity at work, no feel exhausted, not have to go through the airport in a wheelchair   Currently in Pain? 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  No   Is the patient reluctant to leave their home because of a fear of falling?  No     Home Environment   Living Environment  Private residence   Living Arrangements Alone   Available Help at Discharge Family   Type of [place] to enter   Entrance Stairs-Number of Steps 1   Entrance Stairs-Rails None   Home Layout Two level   Alternate Level Stairs-Number of Steps 16   Alternate Level Stairs-Rails Left   Home Equipment None     Prior Function   Level of Independence Independent   Vocation Full time employment   Vocation Requirements clinical research associate- requires lots of weekly travel by plane or car, has to lift laptop bag and luggage, requires lots of walking through airport   Leisure pt does not exercise     Cognition   Overall Cognitive Status Within Functional Limits for tasks assessed     ROM / Strength   AROM / PROM / Strength Strength     Strength   Overall Strength Comments --   Right Shoulder Flexion 4/5   Right Shoulder ABduction 4/5   Left Shoulder Flexion 4/5   Left Shoulder ABduction 4/5   Right Elbow Flexion 5/5   Right Elbow Extension 3+/5   Left Elbow Flexion 5/5   Left Elbow Extension 3+/5   Right Hip Flexion 3+/5   Right Hip ABduction 4/5   Right Hip ADduction 5/5   Left Hip Flexion 3+/5   Left Hip ABduction 4/5   Left Hip ADduction 5/5   Right Knee Flexion 4/5   Right Knee Extension 3+/5   Left Knee Flexion 4/5   Left Knee Extension 3+/5   Right Ankle Dorsiflexion 5/5   Left Ankle Dorsiflexion 5/5     6 Minute Walk- Baseline   6 Minute Walk- Baseline yes  955 feet, 291 meters   Perceived Rate of Exertion (Borg) 15- Hard     6 Minute walk- Post Test   6 Minute Walk Post Test --  955 feet            Objective measurements completed on examination: See above findings.                  PT Education - 05/13/17 1708    Education provided Yes   Education Details importance of a walking program, fitness tracker   Person(s) Educated Patient   Methods Explanation;Handout   Comprehension Verbalized understanding                 [facility] Goals - 05/13/17 1715      CC Long Term Goal  #1   Title Pt will be able to complete 6 min walk test with no shortness of breath and report perceived exertion as very easy to prepare her to walk through the airport  Baseline RPE - hard, pt very short of breath, able to go 955 ft   Time 4   Period Weeks   Status New   Target Date 06/10/17     CC Long Term Goal  #2   Title Pt to be independent in a home exercise program that she is able to progress for continued strengthening and stretching.    Time 4   Period Weeks   Status New   Target Date 06/10/17     CC Long Term Goal  #3   Title Pt to demonstrate 4/5 hip flexor strength to decrease fatigue levels when walking   Baseline 3+/5 bilaterally   Time 4   Period Weeks   Status New   Target Date 06/10/17     CC Long Term Goal  #4   Title Pt to report a 50% improvement in feelings of overall fatigue throughout the day to allow improved function   Time 4   Period Weeks   Status New   Target Date 06/10/17             Plan - 05/13/17 1708    Clinical Impression Statement Pt presents to PT with fatigue and weakness following treatment for non Hodgkins lymphoma. Pt states her job requires her to travel frequently and to fly to frequently. She has to navigate through large airports and feels like she will be unable to do that at this time without a wheelchair due to her fatigue and decreased endurance. Manual muscle testing performed today reveals general weakness especially of bilateral hip flexors. She is very fatigued and after a 6 min walk test was very short of breath walking on level surfaces. She would benefit from skilled PT services to increase bilateral LE and UE strength, teach pt a home exercise program that she is able to progress at home and to decrease fatigue.    History and Personal Factors relevant to plan of care: pt job requires frequent travel by plane or driving and she has  to navigate long distances through airport   Clinical Presentation Evolving   Clinical Presentation due to: pt still undergoing chemotherapy   Clinical Decision Making Moderate   Rehab Potential Good   Clinical Impairments Affecting Rehab Potential pt to return to work and only able to come 1x/wk   PT Frequency 1x / week   PT Duration 4 weeks   PT Treatment/Interventions ADLs/Self Care Home Management;Stair training;Gait training;Neuromuscular re-education;Patient/family education;Therapeutic activities;Therapeutic exercise;Manual techniques   PT Next Visit Plan begin strengthening exercises for whole body- strength ABC?   PT Home Exercise Plan walking program   Consulted and Agree with Plan of Care Patient      Patient will benefit from skilled therapeutic intervention in order to improve the following deficits and impairments:  Difficulty walking, Decreased strength, Decreased endurance, Decreased activity tolerance, Decreased mobility  Visit Diagnosis: Muscle weakness (generalized)  Difficulty in walking, not elsewhere classified     Problem List Patient Active Problem List   Diagnosis Date Noted  . 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

## 2017-05-13 NOTE — Patient Instructions (Signed)
WALKING  Walking is a great form of exercise to increase your strength, endurance and overall fitness.  A walking program can help you start slowly and gradually build endurance as you go.  Everyone's ability is different, so each person's starting point will be different.  You do not have to follow them exactly.  The are just samples. You should simply find out what's right for you and stick to that program.   In the beginning, you'll start off walking 2-3 times a day for short distances.  As you get stronger, you'll be walking further at just 1-2 times per day.  A. You Can Walk For A Certain Length Of Time Each Day    Walk 5 minutes 3 times per day.  Increase 2 minutes every 2 days (3 times per day).  Work up to 25-30 minutes (1-2 times per day).   Example:   Day 1-2 5 minutes 3 times per day   Day 7-8 12 minutes 2-3 times per day   Day 13-14 25 minutes 1-2 times per day  B. You Can Walk For a Certain Distance Each Day     Distance can be substituted for time.    Example:   3 trips to mailbox (at road)   3 trips to corner of block   3 trips around the block  C. Go to local high school and use the track.    Walk for distance ____ around track  Or time ____ minutes  D. Walk ____ Jog ____ Run ___  Please only do the exercises that your therapist has initialed and dated   [facility] [address] [place], [place] [address]         [phone]  Why exercise?  So many benefits! Here are SOME of them: 1. Heart health, including raising your good cholesterol level and reducing heart rate and blood pressure 2. Lung health, including improved lung capacity 3. It burns fats, and most of us can stand to be leaner, whether or not we are overweight. 4. It increases the body's natural painkillers and mood elevators, so makes you feel better. 5. Not only makes you feel better, but look better too 6. Improves sleep 7. Takes a bite out of stress 8. May  decrease your risk of many types of cancer 9. If you are currently undergoing cancer treatment, exercise may improve your ability to tolerate treatments including chemotherapy. 10. For everybody, it can improve your energy level. Those with cancer-related fatigue report a 40-50% reduction in this symptom when exercising regularly. 11. If you are a survivor of breast, colon, or prostate cancer, it may decrease your risk of a recurrence. (This may hold for other cancers too, but so far we have data just for these three types.)  How to exercise: 1. Get your doctor's okay. 2. Pick something you enjoy doing, like walking, Zumba, biking, swimming, or whatever. 3. Start at low intensity and time, then gradually increase.  (See walking program handout.) 4. Set a goal to achieve over time.  The American Cancer Society, American Heart Association, and U.S. Dept. of Health and Human Services recommend 150 minutes of moderate exercise, 75 minutes of vigorous exercise, or a combination of both per week. This should be done in episodes at least 10 minutes long, spread throughout the week.  Need help being motivated? 1. Pick something you enjoy doing, because you'll be more inclined to stick with that activity than something that feels like a   chore. 2. Do it with a friend so that you are accountable to each other. 3. Schedule it into your day. Place it on your calendar and keep that appointment just like you do any appointment that you make. 4. Join an exercise group that meets at a specific time.  That way, you have to show up on time, and that makes it harder to procrastinate about doing your workout.  It also keeps you accountable-people begin to expect you to be there. 5. Join a gym where you feel comfortable and not intimidated, at the right cost. 6. Sign up for something that you'll need to be in shape for on a specific date, like a 1K or a 5K to walk or run, a 20 or 30 mile bike ride, a mud run or something  like that. If the date is looming, you know you'll need to train to be ready for it.  An added benefit is that many of these are fundraisers for good causes. 7. If you've already paid for a gym membership, group exercise class or event, you might as well work out, so you haven't wasted your money!  

## 2017-05-16 ENCOUNTER — Encounter: Payer: Self-pay | Admitting: Hematology

## 2017-05-19 ENCOUNTER — Telehealth: Payer: Self-pay

## 2017-05-19 NOTE — Telephone Encounter (Signed)
Return of information to pt regarding root canal. Okay to proceed with procedure as planned today. Pt verbalized already on antibiotic regimen since Friday. Dr. Irene Limbo okay with this plan.

## 2017-05-20 ENCOUNTER — Encounter: Payer: Self-pay | Admitting: Physical Therapy

## 2017-05-20 ENCOUNTER — Ambulatory Visit: Payer: BLUE CROSS/BLUE SHIELD | Admitting: Physical Therapy

## 2017-05-20 DIAGNOSIS — M6281 Muscle weakness (generalized): Secondary | ICD-10-CM

## 2017-05-20 DIAGNOSIS — R262 Difficulty in walking, not elsewhere classified: Secondary | ICD-10-CM

## 2017-05-20 NOTE — Therapy (Signed)
[place], [place], [address] Phone: [phone]   Fax:  [phone]  Physical Therapy Treatment  Patient Details  Name: Katelyn Reeves MRN: [id number] Date of Birth: 08-26-1951 Referring Provider: Irene Limbo  Encounter Date: 05/20/2017      PT End of Session - 05/20/17 1608    Visit Number 2   Number of Visits 5   Date for PT Re-Evaluation 06/10/17   PT Start Time 1522   PT Stop Time 1605   PT Time Calculation (min) 43 min   Activity Tolerance Patient tolerated treatment well   Behavior During Therapy [facility] for tasks assessed/performed      Past Medical History:  Diagnosis Date  . Back pain   . Headache    migraines  . Swollen lymph nodes    left axilla, rectoperitonal     Past Surgical History:  Procedure Laterality Date  . ABDOMINAL HYSTERECTOMY  1980   total abdominal hysterectomy with tubes and ovaries  . BREAST SURGERY Left 1980's   breast biopsy  . CYST EXCISION Left 2017   left scapula  . HEMORRHOID SURGERY  1970's  . IR GENERIC HISTORICAL  12/20/2016   IR FLUORO GUIDE PORT INSERTION RIGHT 12/20/2016 Markus Daft, MD [facility]  . IR GENERIC HISTORICAL  12/20/2016   IR US GUIDE VASC ACCESS RIGHT 12/20/2016 Markus Daft, MD [facility]  . TUBAL LIGATION  1980's   before hysterectomy  . WRIST GANGLION EXCISION Left 1970's    There were no vitals filed for this visit.      Subjective Assessment - 05/20/17 1523    Subjective I had to have a root canal yesterday and I have been on antibiotics. I had an abcessed tooth. I was feeling really lousy. I did not do any walking until yesterday morning. I walked to the bench and back and it took all I had. I walked again this morning. I could tell I was feeling a little bit better. Today I was able to walk about 15 ft beyond the bench.    Pertinent History Non hodgkins lymphoma, presented with back pain for 4-6 weeks and eventually had pain radiating  to her right inguinal area, she had a CT of the abdomen renal stone protocol on 10/25/2016 which showed extensive retroperitoneal lymphadenopathy concerning for lymphoproliferative disorder, She was incidentally noted to have a nodular border of the liver with possible liver cirrhosis, She subsequently had a PET CT scan ordered by her primary care physician which was done on 11/07/2016. This showed diffuse hypermetabolic spleen with mild splenomegaly. Hypermetabolic adenopathy in the left supraclavicular, bilateral axillary, peripancreatic, gastrohepatic ligament, celiac, porta hepatis, periaortic, and left external iliac chains. Hypermetabolic mesenteric lymph nodes. Appearance favors lymphoma   How long can you walk comfortably? 2 or 3 minutes   Patient Stated Goals to be able to work at my full capacity at work, no feel exhausted, not have to go through the airport in a wheelchair   Currently in Pain? Yes   Pain Score 1    Pain Location Teeth   Pain Orientation Left;Lower   Pain Descriptors / Indicators Discomfort   Pain Type Acute pain                         [facility] Adult PT Treatment/Exercise - 05/20/17 0001      Shoulder Exercises: Standing   Other Standing Exercises instructed pt in Strength After Breast Cancer program  through squats and stopped here- did all exercises x 10, held stretches for 30 sec, used 2 lb weights, skipped superwoman exercise. Issued this to pt to perform at home through squats                        [facility] - 05/13/17 1715      CC Long Term Goal  #1   Title Pt will be able to complete 6 min walk test with no shortness of breath and report perceived exertion as very easy to prepare her to walk through the airport   Baseline RPE - hard, pt very short of breath, able to go 955 ft   Time 4   Period Weeks   Status New   Target Date 06/10/17     CC Long Term Goal  #2   Title Pt to be independent in a home exercise  program that she is able to progress for continued strengthening and stretching.    Time 4   Period Weeks   Status New   Target Date 06/10/17     CC Long Term Goal  #3   Title Pt to demonstrate 4/5 hip flexor strength to decrease fatigue levels when walking   Baseline 3+/5 bilaterally   Time 4   Period Weeks   Status New   Target Date 06/10/17     CC Long Term Goal  #4   Title Pt to report a 50% improvement in feelings of overall fatigue throughout the day to allow improved function   Time 4   Period Weeks   Status New   Target Date 06/10/17            Plan - 05/20/17 1608    Clinical Impression Statement Instructed pt in Strength ABC program through page with squats. Had pt demonstrate all exercises x 10 and hold stretches for 30 seconds. Pt states she has begun her walking program and is attempting to increase the distance a small amount daily. Educated pt to begin Strength ABC at home through the page with squats daily.   Rehab Potential Good   Clinical Impairments Affecting Rehab Potential pt to return to work and only able to come 1x/wk   PT Frequency 1x / week   PT Duration 4 weeks   PT Treatment/Interventions ADLs/Self Care Home Management;Stair training;Gait training;Neuromuscular re-education;Patient/family education;Therapeutic activities;Therapeutic exercise;Manual techniques   PT Next Visit Plan continue instruction in strength ABC picking up after squats, educate on [place] walking program, strength ABC through squats   Consulted and Agree with Plan of Care Patient      Patient will benefit from skilled therapeutic intervention in order to improve the following deficits and impairments:  Difficulty walking, Decreased strength, Decreased endurance, Decreased activity tolerance, Decreased mobility  Visit Diagnosis: Muscle weakness (generalized)  Difficulty in walking, not elsewhere classified     Problem List Patient Active  Problem List   Diagnosis Date Noted  . 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

## 2017-05-28 ENCOUNTER — Ambulatory Visit: Payer: BLUE CROSS/BLUE SHIELD | Attending: Hematology

## 2017-05-28 DIAGNOSIS — R262 Difficulty in walking, not elsewhere classified: Secondary | ICD-10-CM | POA: Insufficient documentation

## 2017-05-28 DIAGNOSIS — M6281 Muscle weakness (generalized): Secondary | ICD-10-CM | POA: Insufficient documentation

## 2017-05-28 NOTE — Therapy (Signed)
[place], [place], [address] Phone: [phone]   Fax:  [phone]  Physical Therapy Treatment  Patient Details  Name: Katelyn Reeves MRN: [id number] Date of Birth: Dec 01, 1950 Referring Provider: Irene Limbo  Encounter Date: 05/28/2017      PT End of Session - 05/28/17 1716    Visit Number 3   Number of Visits 5   Date for PT Re-Evaluation 06/10/17   PT Start Time 9373   PT Stop Time 1705   PT Time Calculation (min) 58 min   Activity Tolerance Patient tolerated treatment well   Behavior During Therapy [facility] for tasks assessed/performed      Past Medical History:  Diagnosis Date  . Back pain   . Headache    migraines  . Swollen lymph nodes    left axilla, rectoperitonal     Past Surgical History:  Procedure Laterality Date  . ABDOMINAL HYSTERECTOMY  1980   total abdominal hysterectomy with tubes and ovaries  . BREAST SURGERY Left 1980's   breast biopsy  . CYST EXCISION Left 2017   left scapula  . HEMORRHOID SURGERY  1970's  . IR GENERIC HISTORICAL  12/20/2016   IR FLUORO GUIDE PORT INSERTION RIGHT 12/20/2016 Markus Daft, MD [facility]  . IR GENERIC HISTORICAL  12/20/2016   IR US GUIDE VASC ACCESS RIGHT 12/20/2016 Markus Daft, MD [facility]  . TUBAL LIGATION  1980's   before hysterectomy  . WRIST GANGLION EXCISION Left 1970's    There were no vitals filed for this visit.      Subjective Assessment - 05/28/17 1612    Subjective The root canal is healing fine but I will have another smaller surgery to fix something that they messed up during the root canal. I've started trying to work the exercises in I was taught at my last visit and that's going okay doing them throughout my day. I feel good when I've been able to walk but it's been so hot I haven't been walking as much as I want too.   Pertinent History Non hodgkins lymphoma, presented with back pain for 4-6 weeks and eventually had pain  radiating to her right inguinal area, she had a CT of the abdomen renal stone protocol on 10/25/2016 which showed extensive retroperitoneal lymphadenopathy concerning for lymphoproliferative disorder, She was incidentally noted to have a nodular border of the liver with possible liver cirrhosis, She subsequently had a PET CT scan ordered by her primary care physician which was done on 11/07/2016. This showed diffuse hypermetabolic spleen with mild splenomegaly. Hypermetabolic adenopathy in the left supraclavicular, bilateral axillary, peripancreatic, gastrohepatic ligament, celiac, porta hepatis, periaortic, and left external iliac chains. Hypermetabolic mesenteric lymph nodes. Appearance favors lymphoma   Currently in Pain? No/denies                         [facility] Adult PT Treatment/Exercise - 05/28/17 0001      Self-Care   Self-Care Other Self-Care Comments   Other Self-Care Comments  Spent alot of time answering pts questions regarding how to safely progress exercises now in near future.     Shoulder Exercises: Standing   Other Standing Exercises Completed instruction of Strength ABC Program from one arm rows using 1-2 lbs 5-10 times each and answered pts questions regarding previously instructed exercisee.   Other Standing Exercises Doorway Pectoralis Stretch 3 times with 10-20 sec hold at varying heights  Long Term Clinic Goals - 05/13/17 1715      CC Long Term Goal  #1   Title Pt will be able to complete 6 min walk test with no shortness of breath and report perceived exertion as very easy to prepare her to walk through the airport   Baseline RPE - hard, pt very short of breath, able to go 955 ft   Time 4   Period Weeks   Status New   Target Date 06/10/17     CC Long Term Goal  #2   Title Pt to be independent in a home exercise program that she is able to progress for continued strengthening and stretching.    Time 4   Period Weeks    Status New   Target Date 06/10/17     CC Long Term Goal  #3   Title Pt to demonstrate 4/5 hip flexor strength to decrease fatigue levels when walking   Baseline 3+/5 bilaterally   Time 4   Period Weeks   Status New   Target Date 06/10/17     CC Long Term Goal  #4   Title Pt to report a 50% improvement in feelings of overall fatigue throughout the day to allow improved function   Time 4   Period Weeks   Status New   Target Date 06/10/17            Plan - 05/28/17 1717    Clinical Impression Statement Pt did well with completing instruction of Strength ABC Program and was very engaged throughout asking good questions pertaining to importance of exercises now and how to safely progress herself in future as her endurance improves. Spent alot of time addressing these questions with pt as well today.    Rehab Potential Good   Clinical Impairments Affecting Rehab Potential pt to return to work and only able to come 1x/wk   PT Frequency 1x / week   PT Duration 4 weeks   PT Treatment/Interventions ADLs/Self Care Home Management;Stair training;Gait training;Neuromuscular re-education;Patient/family education;Therapeutic activities;Therapeutic exercise;Manual techniques   PT Next Visit Plan Reassess goals. Review Strength ABC Program prn; educate on superwoman and assess pts progress and indepenedence thus far working towards D/C.    Consulted and Agree with Plan of Care Patient      Patient will benefit from skilled therapeutic intervention in order to improve the following deficits and impairments:  Difficulty walking, Decreased strength, Decreased endurance, Decreased activity tolerance, Decreased mobility  Visit Diagnosis: Muscle weakness (generalized)  Difficulty in walking, not elsewhere classified     Problem List Patient Active Problem List   Diagnosis Date Noted  . 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) 12/11/2016    Otelia Limes, PTA 05/28/2017, 5:22 PM  [place], [place], [address] Phone: [phone]   Fax:  [phone]  Name: Katelyn Reeves MRN: [id number] Date of Birth: 07/02/1951

## 2017-05-30 ENCOUNTER — Ambulatory Visit (HOSPITAL_BASED_OUTPATIENT_CLINIC_OR_DEPARTMENT_OTHER): Payer: BLUE CROSS/BLUE SHIELD

## 2017-05-30 DIAGNOSIS — Z452 Encounter for adjustment and management of vascular access device: Secondary | ICD-10-CM | POA: Diagnosis not present

## 2017-05-30 DIAGNOSIS — C8248 Follicular lymphoma grade IIIb, lymph nodes of multiple sites: Secondary | ICD-10-CM

## 2017-05-30 DIAGNOSIS — Z95828 Presence of other vascular implants and grafts: Secondary | ICD-10-CM

## 2017-05-30 MED ORDER — SODIUM CHLORIDE 0.9% FLUSH
10.0000 mL | Freq: Once | INTRAVENOUS | Status: AC
  Administered 2017-05-30: 10 mL
  Filled 2017-05-30: qty 10

## 2017-05-30 MED ORDER — HEPARIN SOD (PORK) LOCK FLUSH 100 UNIT/ML IV SOLN
500.0000 [IU] | Freq: Once | INTRAVENOUS | Status: AC
  Administered 2017-05-30: 500 [IU]
  Filled 2017-05-30: qty 5

## 2017-06-03 ENCOUNTER — Encounter: Payer: Self-pay | Admitting: Hematology

## 2017-06-03 ENCOUNTER — Telehealth: Payer: Self-pay

## 2017-06-03 ENCOUNTER — Ambulatory Visit: Payer: BLUE CROSS/BLUE SHIELD | Admitting: Physical Therapy

## 2017-06-03 DIAGNOSIS — M6281 Muscle weakness (generalized): Secondary | ICD-10-CM

## 2017-06-03 DIAGNOSIS — R262 Difficulty in walking, not elsewhere classified: Secondary | ICD-10-CM

## 2017-06-03 NOTE — Therapy (Addendum)
[place], [place], [address] Phone: [phone]   Fax:  [phone]  Physical Therapy Treatment  Patient Details  Name: Katelyn Reeves MRN: [id number] Date of Birth: 1951/08/25 Referring Provider: Irene Limbo  Encounter Date: 06/03/2017      PT End of Session - 06/03/17 1633    Visit Number 4   Number of Visits 5   Date for PT Re-Evaluation 06/10/17   PT Start Time 5885   PT Stop Time 1600   PT Time Calculation (min) 45 min   Activity Tolerance Patient tolerated treatment well      Past Medical History:  Diagnosis Date  . Back pain   . Headache    migraines  . Swollen lymph nodes    left axilla, rectoperitonal     Past Surgical History:  Procedure Laterality Date  . ABDOMINAL HYSTERECTOMY  1980   total abdominal hysterectomy with tubes and ovaries  . BREAST SURGERY Left 1980's   breast biopsy  . CYST EXCISION Left 2017   left scapula  . HEMORRHOID SURGERY  1970's  . IR GENERIC HISTORICAL  12/20/2016   IR FLUORO GUIDE PORT INSERTION RIGHT 12/20/2016 Markus Daft, MD [facility]  . IR GENERIC HISTORICAL  12/20/2016   IR US GUIDE VASC ACCESS RIGHT 12/20/2016 Markus Daft, MD [facility]  . TUBAL LIGATION  1980's   before hysterectomy  . WRIST GANGLION EXCISION Left 1970's    There were no vitals filed for this visit.      Subjective Assessment - 06/03/17 1524    Subjective Pt reports she has been having back pain since last week. She has been trying to figure out what is the cause of her pain. She has not talked with Dr. Irene Limbo about it. She says she does not hae the pain all the time. She hasn't been walking because she was afraid it would aggravate her pain    Pertinent History Non hodgkins lymphoma, presented with back pain for 4-6 weeks and eventually had pain radiating to her right inguinal area, she had a CT of the abdomen renal stone protocol on 10/25/2016 which showed extensive retroperitoneal  lymphadenopathy concerning for lymphoproliferative disorder, She was incidentally noted to have a nodular border of the liver with possible liver cirrhosis, She subsequently had a PET CT scan ordered by her primary care physician which was done on 11/07/2016. This showed diffuse hypermetabolic spleen with mild splenomegaly. Hypermetabolic adenopathy in the left supraclavicular, bilateral axillary, peripancreatic, gastrohepatic ligament, celiac, porta hepatis, periaortic, and left external iliac chains. Hypermetabolic mesenteric lymph nodes. Appearance favors lymphoma   Patient Stated Goals to be able to work at my full capacity at work, no feel exhausted, not have to go through the airport in a wheelchair   Currently in Pain? Yes   Pain Location Back  and to the right side under the ribs    Pain Orientation Right   Pain Descriptors / Indicators Aching   Pain Type Acute pain   Pain Onset 1 to 4 weeks ago   Pain Frequency Once a week   Aggravating Factors  can't say. notices with when she moves sometime . When she tries to get up it pulls                          [facility] Adult PT Treatment/Exercise - 06/03/17 0001      Self-Care   Self-Care Other Self-Care Comments  Other Self-Care Comments  instructed in back precautions: no bending, arching, or twisting of back. using a  folded towel for lumbar support while sitting in chair., moving files in office so she does not have to rotate back to get them. stand up / walk for 5 minutes every hour.      Exercises   Exercises Lumbar     Lumbar Exercises: Stretches   Single Knee to Chest Stretch 5 reps  pt returned to bent knee position    Pelvic Tilt 5 reps   Pelvic Tilt Limitations pt needed verbal and manual cues to do tilt.  She had some increase in stretching type of pain with tilts      Lumbar Exercises: Supine   Ab Set 5 reps   Glut Set 5 reps   Heel Slides 5 reps     Knee/Hip Exercises: Supine   Short Arc Quad Sets  Strengthening;Right;Left;10 reps   Short Arc Quad Sets Limitations slowly with knee isometric for one second with knee in extension over bolster                 PT Education - 06/03/17 1612    Education provided Yes   Education Details transverse abdominals exercise    Person(s) Educated Patient   Methods Explanation;Demonstration;Handout   Comprehension Verbalized understanding;Returned demonstration                Long Term Clinic Goals - 05/13/17 1715      CC Long Term Goal  #1   Title Pt will be able to complete 6 min walk test with no shortness of breath and report perceived exertion as very easy to prepare her to walk [place] the airport   Baseline RPE - hard, pt very short of breath, able to go 955 ft   Time 4   Period Weeks   Status New   Target Date 06/10/17     CC Long Term Goal  #2   Title Pt to be independent in a home exercise program that she is able to progress for continued strengthening and stretching.    Time 4   Period Weeks   Status New   Target Date 06/10/17     CC Long Term Goal  #3   Title Pt to demonstrate 4/5 hip flexor strength to decrease fatigue levels when walking   Baseline 3+/5 bilaterally   Time 4   Period Weeks   Status New   Target Date 06/10/17     CC Long Term Goal  #4   Title Pt to report a 50% improvement in feelings of overall fatigue throughout the day to allow improved function   Time 4   Period Weeks   Status New   Target Date 06/10/17            Plan - 06/03/17 1634    Clinical Impression Statement Pt reports she has been having back pain since last week and has not been able to do any exericse.  She is trying to figure out on her if it is muscular or something else before she calls Dr. Irene Limbo. She did well with transverse abdominal exercise and back stretching today and will try some back care ideas to see if they will help decrease the pain.    Clinical Impairments Affecting Rehab Potential pt to return  to work and only able to come 1x/wk   PT Next Visit Plan Reassess goals and consider discharge or renewal.  consider core  stabalization component with all exercise to help decrease back pain. encourage walking program    Consulted and Agree with Plan of Care Patient      Patient will benefit from skilled therapeutic intervention in order to improve the following deficits and impairments:  Difficulty walking, Decreased strength, Decreased endurance, Decreased activity tolerance, Decreased mobility  Visit Diagnosis: Muscle weakness (generalized)  Difficulty in walking, not elsewhere classified     Problem List Patient Active Problem List   Diagnosis Date Noted  . Port catheter in place 05/30/2017  . 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 Owens Shark, PT  [place] Levo 06/03/2017, 4:39 PM  [place], [place], [address] Phone: [phone]   Fax:  [phone]  Name: Katelyn Reeves MRN: [id number] Date of Birth: July 13, 1951  PHYSICAL THERAPY DISCHARGE SUMMARY Visits from Start of Care: 4  Current functional level related to goals / functional outcomes: unknown   Remaining deficits: unknown   Education / Equipment: Home exercise Plan: Patient agrees to discharge.  Patient goals were partially met. Patient is being discharged due to not returning since the last visit.  ?????    Maudry Diego, PT 04/29/18 12:18 PM

## 2017-06-03 NOTE — Telephone Encounter (Signed)
Left message with pt. Okay to get flu vaccine. Any reputable location is fine per Dr. Irene Limbo. Scheduling message sent to have flush appts changed to [facility] because of location and distance to travel to [place] [employer].

## 2017-06-03 NOTE — Patient Instructions (Signed)

## 2017-06-04 ENCOUNTER — Telehealth: Payer: Self-pay

## 2017-06-04 ENCOUNTER — Other Ambulatory Visit: Payer: Self-pay

## 2017-06-04 DIAGNOSIS — Z95828 Presence of other vascular implants and grafts: Secondary | ICD-10-CM

## 2017-06-04 NOTE — Telephone Encounter (Signed)
Pt flush appt set up at [facility] same day and time 07/30/17 at 0830. Orders placed signed and held for flush to be easily accessed. Pt given directions for Day Surgery down the hallway from the elevator on the first floor inside of [facility]. Pt okay to get flu vaccine per Dr. Irene Limbo. Additional query about port bracelet. Pt had one that broke. Pt to call manufacturer and let us know if any issues getting another bracelet.

## 2017-06-10 ENCOUNTER — Encounter: Payer: BLUE CROSS/BLUE SHIELD | Admitting: Physical Therapy

## 2017-06-11 ENCOUNTER — Encounter: Payer: Self-pay | Admitting: Hematology

## 2017-06-23 ENCOUNTER — Encounter: Payer: Self-pay | Admitting: Hematology

## 2017-06-26 ENCOUNTER — Telehealth: Payer: Self-pay

## 2017-06-26 ENCOUNTER — Other Ambulatory Visit: Payer: Self-pay

## 2017-06-26 NOTE — Telephone Encounter (Signed)
Spoke with pt regarding message sent through [facility]. Pt concerned that she may have to reschedule appt on 10/10 as she has a flight due to arrive at 2am that morning. Explained to pt that it may be better to change appt date if able because less than 24 hours notice of appointment cancellation will result in a no show. Pt in agreement. Preference for morning appointments. Notified scheduling of change to 07/04/17 for all appointments on 07/02/17. Pt okay to have VM left on mobile as she is travelling.

## 2017-06-27 ENCOUNTER — Telehealth: Payer: Self-pay | Admitting: Hematology

## 2017-06-27 NOTE — Telephone Encounter (Signed)
Spoke with patient regarding the status of her appts. Emailed [facility] regarding moving her appts to Friday.

## 2017-06-30 ENCOUNTER — Telehealth: Payer: Self-pay | Admitting: Hematology

## 2017-06-30 ENCOUNTER — Ambulatory Visit: Payer: BLUE CROSS/BLUE SHIELD

## 2017-06-30 NOTE — Telephone Encounter (Signed)
Spoke with patient regarding the changes in her schedule per 10/4 sch msg.

## 2017-07-02 ENCOUNTER — Ambulatory Visit: Payer: BLUE CROSS/BLUE SHIELD | Admitting: Hematology

## 2017-07-02 ENCOUNTER — Ambulatory Visit: Payer: BLUE CROSS/BLUE SHIELD

## 2017-07-02 ENCOUNTER — Other Ambulatory Visit: Payer: BLUE CROSS/BLUE SHIELD

## 2017-07-02 ENCOUNTER — Encounter: Payer: Self-pay | Admitting: Pharmacist

## 2017-07-03 NOTE — Addendum Note (Signed)
Addended by: Unice Bailey B on: 07/03/2017 09:10 AM   Modules accepted: Orders

## 2017-07-04 ENCOUNTER — Ambulatory Visit (HOSPITAL_BASED_OUTPATIENT_CLINIC_OR_DEPARTMENT_OTHER): Payer: BLUE CROSS/BLUE SHIELD

## 2017-07-04 ENCOUNTER — Other Ambulatory Visit (HOSPITAL_BASED_OUTPATIENT_CLINIC_OR_DEPARTMENT_OTHER): Payer: BLUE CROSS/BLUE SHIELD

## 2017-07-04 ENCOUNTER — Ambulatory Visit: Payer: BLUE CROSS/BLUE SHIELD

## 2017-07-04 ENCOUNTER — Telehealth: Payer: Self-pay | Admitting: Hematology

## 2017-07-04 ENCOUNTER — Ambulatory Visit (HOSPITAL_BASED_OUTPATIENT_CLINIC_OR_DEPARTMENT_OTHER): Payer: BLUE CROSS/BLUE SHIELD | Admitting: Hematology

## 2017-07-04 VITALS — BP 127/67 | HR 76 | Temp 98.3°F | Resp 20 | Ht 62.0 in | Wt 191.9 lb

## 2017-07-04 VITALS — BP 129/67 | HR 77 | Temp 97.9°F | Resp 16

## 2017-07-04 DIAGNOSIS — T451X5A Adverse effect of antineoplastic and immunosuppressive drugs, initial encounter: Secondary | ICD-10-CM | POA: Diagnosis not present

## 2017-07-04 DIAGNOSIS — M25511 Pain in right shoulder: Secondary | ICD-10-CM | POA: Diagnosis not present

## 2017-07-04 DIAGNOSIS — G620 Drug-induced polyneuropathy: Secondary | ICD-10-CM | POA: Diagnosis not present

## 2017-07-04 DIAGNOSIS — Z5112 Encounter for antineoplastic immunotherapy: Secondary | ICD-10-CM

## 2017-07-04 DIAGNOSIS — C8248 Follicular lymphoma grade IIIb, lymph nodes of multiple sites: Secondary | ICD-10-CM

## 2017-07-04 DIAGNOSIS — Z7189 Other specified counseling: Secondary | ICD-10-CM | POA: Insufficient documentation

## 2017-07-04 DIAGNOSIS — Z95828 Presence of other vascular implants and grafts: Secondary | ICD-10-CM

## 2017-07-04 LAB — COMPREHENSIVE METABOLIC PANEL
ALBUMIN: 4.1 g/dL (ref 3.5–5.0)
ALK PHOS: 75 U/L (ref 40–150)
ALT: 22 U/L (ref 0–55)
AST: 26 U/L (ref 5–34)
Anion Gap: 11 mEq/L (ref 3–11)
BUN: 13 mg/dL (ref 7.0–26.0)
CALCIUM: 9.6 mg/dL (ref 8.4–10.4)
CO2: 24 mEq/L (ref 22–29)
CREATININE: 0.8 mg/dL (ref 0.6–1.1)
Chloride: 104 mEq/L (ref 98–109)
EGFR: >60 mL/min/{1.73_m2} (ref >60)
Glucose: 165 mg/dl — ABNORMAL HIGH (ref 70–140)
POTASSIUM: 4.3 meq/L (ref 3.5–5.1)
Sodium: 139 mEq/L (ref 136–145)
Total Bilirubin: 0.47 mg/dL (ref 0.20–1.20)
Total Protein: 7.4 g/dL (ref 6.4–8.3)

## 2017-07-04 LAB — CBC WITH DIFFERENTIAL/PLATELET
BASO%: 1.1 % (ref 0.0–2.0)
BASOS ABS: 0.1 10*3/uL (ref 0.0–0.1)
EOS ABS: 0.2 10*3/uL (ref 0.0–0.5)
EOS%: 4 % (ref 0.0–7.0)
HEMATOCRIT: 36.2 % (ref 34.8–46.6)
HEMOGLOBIN: 12.4 g/dL (ref 11.6–15.9)
LYMPH#: 0.9 10*3/uL (ref 0.9–3.3)
LYMPH%: 19.3 % (ref 14.0–49.7)
MCH: 30.4 pg (ref 25.1–34.0)
MCHC: 34.2 g/dL (ref 31.5–36.0)
MCV: 88.8 fL (ref 79.5–101.0)
MONO#: 0.4 10*3/uL (ref 0.1–0.9)
MONO%: 7.6 % (ref 0.0–14.0)
NEUT#: 3.2 10*3/uL (ref 1.5–6.5)
NEUT%: 68 % (ref 38.4–76.8)
Platelets: 229 10*3/uL (ref 145–400)
RBC: 4.08 10*6/uL (ref 3.70–5.45)
RDW: 13 % (ref 11.2–14.5)
WBC: 4.7 10*3/uL (ref 3.9–10.3)

## 2017-07-04 MED ORDER — SODIUM CHLORIDE 0.9% FLUSH
10.0000 mL | Freq: Once | INTRAVENOUS | Status: AC
  Administered 2017-07-04: 10 mL
  Filled 2017-07-04: qty 10

## 2017-07-04 MED ORDER — SODIUM CHLORIDE 0.9 % IV SOLN
375.0000 mg/m2 | Freq: Once | INTRAVENOUS | Status: AC
  Administered 2017-07-04: 700 mg via INTRAVENOUS
  Filled 2017-07-04: qty 50

## 2017-07-04 MED ORDER — DIPHENHYDRAMINE HCL 25 MG PO CAPS
50.0000 mg | ORAL_CAPSULE | Freq: Once | ORAL | Status: AC
  Administered 2017-07-04: 50 mg via ORAL

## 2017-07-04 MED ORDER — SODIUM CHLORIDE 0.9 % IV SOLN
Freq: Once | INTRAVENOUS | Status: AC
  Administered 2017-07-04: 12:00:00 via INTRAVENOUS

## 2017-07-04 MED ORDER — DIPHENHYDRAMINE HCL 25 MG PO CAPS
ORAL_CAPSULE | ORAL | Status: AC
  Filled 2017-07-04: qty 2

## 2017-07-04 MED ORDER — ACETAMINOPHEN 325 MG PO TABS
ORAL_TABLET | ORAL | Status: AC
  Filled 2017-07-04: qty 2

## 2017-07-04 MED ORDER — SODIUM CHLORIDE 0.9% FLUSH
10.0000 mL | INTRAVENOUS | Status: DC | PRN
  Administered 2017-07-04: 10 mL
  Filled 2017-07-04: qty 10

## 2017-07-04 MED ORDER — ACETAMINOPHEN 325 MG PO TABS
650.0000 mg | ORAL_TABLET | Freq: Once | ORAL | Status: AC
  Administered 2017-07-04: 650 mg via ORAL

## 2017-07-04 MED ORDER — HEPARIN SOD (PORK) LOCK FLUSH 100 UNIT/ML IV SOLN
500.0000 [IU] | Freq: Once | INTRAVENOUS | Status: AC | PRN
  Administered 2017-07-04: 500 [IU]
  Filled 2017-07-04: qty 5

## 2017-07-04 NOTE — Patient Instructions (Signed)
Implanted Port Home Guide An implanted port is a type of central line that is placed under the skin. Central lines are used to provide IV access when treatment or nutrition needs to be given through a person's veins. Implanted ports are used for long-term IV access. An implanted port may be placed because:  You need IV medicine that would be irritating to the small veins in your hands or arms.  You need long-term IV medicines, such as antibiotics.  You need IV nutrition for a long period.  You need frequent blood draws for lab tests.  You need dialysis.  Implanted ports are usually placed in the chest area, but they can also be placed in the upper arm, the abdomen, or the leg. An implanted port has two main parts:  Reservoir. The reservoir is round and will appear as a small, raised area under your skin. The reservoir is the part where a needle is inserted to give medicines or draw blood.  Catheter. The catheter is a thin, flexible tube that extends from the reservoir. The catheter is placed into a large vein. Medicine that is inserted into the reservoir goes into the catheter and then into the vein.  How will I care for my incision site? Do not get the incision site wet. Bathe or shower as directed by your health care provider. How is my port accessed? Special steps must be taken to access the port:  Before the port is accessed, a numbing cream can be placed on the skin. This helps numb the skin over the port site.  Your health care provider uses a sterile technique to access the port. ? Your health care provider must put on a mask and sterile gloves. ? The skin over your port is cleaned carefully with an antiseptic and allowed to dry. ? The port is gently pinched between sterile gloves, and a needle is inserted into the port.  Only "non-coring" port needles should be used to access the port. Once the port is accessed, a blood return should be checked. This helps ensure that the port  is in the vein and is not clogged.  If your port needs to remain accessed for a constant infusion, a clear (transparent) bandage will be placed over the needle site. The bandage and needle will need to be changed every week, or as directed by your health care provider.  Keep the bandage covering the needle clean and dry. Do not get it wet. Follow your health care provider's instructions on how to take a shower or bath while the port is accessed.  If your port does not need to stay accessed, no bandage is needed over the port.  What is flushing? Flushing helps keep the port from getting clogged. Follow your health care provider's instructions on how and when to flush the port. Ports are usually flushed with saline solution or a medicine called heparin. The need for flushing will depend on how the port is used.  If the port is used for intermittent medicines or blood draws, the port will need to be flushed: ? After medicines have been given. ? After blood has been drawn. ? As part of routine maintenance.  If a constant infusion is running, the port may not need to be flushed.  How long will my port stay implanted? The port can stay in for as long as your health care provider thinks it is needed. When it is time for the port to come out, surgery will be   done to remove it. The procedure is similar to the one performed when the port was put in. When should I seek immediate medical care? When you have an implanted port, you should seek immediate medical care if:  You notice a bad smell coming from the incision site.  You have swelling, redness, or drainage at the incision site.  You have more swelling or pain at the port site or the surrounding area.  You have a fever that is not controlled with medicine.  This information is not intended to replace advice given to you by your health care provider. Make sure you discuss any questions you have with your health care provider. Document  Released: 09/09/2005 Document Revised: 02/15/2016 Document Reviewed: 05/17/2013 [employer] Interactive Patient Education  2017 [employer]  

## 2017-07-04 NOTE — Progress Notes (Signed)
HEMATOLOGY/ONCOLOGY CLINIC NOTE  Date of Service: 07/04/17  Patient Care Team: Asencion Noble, MD as PCP - General (Internal Medicine)  CHIEF COMPLAINTS/PURPOSE OF CONSULTATION:   f/u for high grade follicular lymphoma  HISTORY OF PRESENTING ILLNESS:   Katelyn Reeves is a wonderful 66 y.o. female who has been referred to [place] by Dr .Asencion Noble, MD for evaluation and management of suspected non-Hodgkin's lymphoma.  Patient notes that she presented with back pain for 4-6 weeks and eventually had pain radiating to her right inguinal area. She notes that she has had interstitial cystitis in the past. She was thought to have a urinary tract infection and was treated with Macrodantin. Says the pain got worse and started radiating into the right groin she had a CT of the abdomen renal stone protocol on 10/25/2016 which showed extensive retroperitoneal lymphadenopathy concerning for lymphoproliferative disorder. She was incidentally noted to have a nodular border of the liver with possible liver cirrhosis. No urinary stones noted.  She subsequently had a PET CT scan ordered by her primary care physician which was done on 11/07/2016. This showed diffuse hypermetabolic spleen with mild splenomegaly. Hypermetabolic adenopathy in the left supraclavicular, bilateral axillary, peripancreatic, gastrohepatic ligament, celiac, porta hepatis, periaortic, and left external iliac chains. Hypermetabolic mesenteric lymph nodes. Appearance favors lymphoma.  Patient notes some night sweats for 2 weeks. Has lost about 10 pounds in the last month. No acute fevers or chills. Notes the back pain is controlled with when necessary Vicodin.  INTERVAL HISTORY  Katelyn Reeves is here for her scheduled follow-up. She has been doing well overall. She reports that she has been travelling a lot recently all over the country for her work. She states that she continues to have some fatigue, but this has been manageable overall and no  prohibiting her from her daily activities. She denies any issues with weight or appetite. Additionally, she states that her neuropathy has been improving overall. She does note some intermittent right throbbing shoulder pain which radiated downward into her right forearm. She reports that there is a small nodule over the area of pain to the right forearm, but her pain has improved since initial onset. We will continue with maintenance Rituxan today. No recent issues with infections.   MEDICAL HISTORY:  #1 fibrocystic disease of the breast #2 hypertension #3 constipation #4 duplicated right sided ureters #5 imaging concern for possible liver cirrhosis  SURGICAL HISTORY:  #1 total abdominal hysterectomy with bilateral salpingo-oophorectomy for ovarian cyst. #2 Lipoma removal left upper back #3 hemorrhoidectomy  SOCIAL HISTORY: Social History   Social History  . Marital status: Divorced    Spouse name: N/A  . Number of children: N/A  . Years of education: N/A   Occupational History  . Not on file.   Social History Main Topics  . Smoking status: Never Smoker  . Smokeless tobacco: Never Used  . Alcohol use No  . Drug use: No  . Sexual activity: Not on file   Other Topics Concern  . Not on file   Social History Narrative  . No narrative on file  Nonsmoker no issues with alcohol use or drug use. 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   FAMILY HISTORY:  Notes her mother had ovarian cancer followed by leukemia and died at age 25 years Maternal aunt gastric cancer  ALLERGIES:  is allergic to sulfa antibiotics.  MEDICATIONS:  Current Outpatient Prescriptions  Medication Sig Dispense Refill  .  amoxicillin (AMOXIL) 500 MG capsule Take 500 mg by mouth 3 (three) times daily.    Marland Kitchen b complex vitamins tablet Take 1 tablet by mouth daily.    Marland Kitchen pyridOXINE (VITAMIN B-6) 100 MG tablet Take 100 mg by mouth daily.    Marland Kitchen senna-docusate (SENNA S)  8.6-50 MG tablet Take 2 tablets by mouth at bedtime as needed for mild constipation or moderate constipation. 60 tablet 1   No current facility-administered medications for this visit.    Facility-Administered Medications Ordered in Other Visits  Medication Dose Route Frequency Provider Last Rate Last Dose  . sodium chloride flush (NS) 0.9 % injection 10 mL  10 mL Intracatheter PRN Brunetta Genera, MD   10 mL at 07/04/17 1540    REVIEW OF SYSTEMS:    10 Point review of Systems was done is negative except as noted above.  PHYSICAL EXAMINATION: ECOG PERFORMANCE STATUS: 1 - Symptomatic but completely ambulatory  . Vitals:   07/04/17 1038  BP: 127/67  Pulse: 76  Resp: 20  Temp: 98.3 F (36.8 C)  SpO2: 99%   Filed Weights   07/04/17 1038  Weight: 191 lb 14.4 oz (87 kg)   .Body mass index is 35.1 kg/m.  GENERAL:alert, in no acute distress and comfortable SKIN: skin color, texture, turgor are normal, no rashes or significant lesions EYES: normal, conjunctiva are pink and non-injected, sclera clear OROPHARYNX:no exudate, no erythema and lips, buccal mucosa, and tongue normal  NECK: supple, no JVD, thyroid normal size, non-tender, without nodularity LYMPH: palpable left supraclavicular and left axillary lymph node. Smaller right axillary lymph node. LUNGS: clear to auscultation with normal respiratory effort HEART: regular rate & rhythm,  no murmurs and no lower extremity edema ABDOMEN: abdomen soft, non-tender, normoactive bowel sounds , mild tenderness to palpation in left upper abdomen. Musculoskeletal: no cyanosis of digits and no clubbing  PSYCH: alert & oriented x 3 with fluent speech NEURO: no focal motor/sensory deficits  LABORATORY DATA:  I have reviewed the data as listed  . CBC Latest Ref Rng & Units 07/04/2017 05/02/2017 04/08/2017  WBC 3.9 - 10.3 10e3/uL 4.7 4.5 5.2  Hemoglobin 11.6 - 15.9 g/dL 12.4 11.7 10.7(L)  Hematocrit 34.8 - 46.6 % 36.2 34.4(L)  31.6(L)  Platelets 145 - 400 10e3/uL 229 217 239   CBC    Component Value Date/Time   WBC 4.7 07/04/2017 0915   WBC 5.0 12/20/2016 1141   RBC 4.08 07/04/2017 0915   RBC 4.16 12/20/2016 1141   HGB 12.4 07/04/2017 0915   HCT 36.2 07/04/2017 0915   PLT 229 07/04/2017 0915   MCV 88.8 07/04/2017 0915   MCH 30.4 07/04/2017 0915   MCH 29.1 12/20/2016 1141   MCHC 34.2 07/04/2017 0915   MCHC 33.8 12/20/2016 1141   RDW 13.0 07/04/2017 0915   LYMPHSABS 0.9 07/04/2017 0915   MONOABS 0.4 07/04/2017 0915   EOSABS 0.2 07/04/2017 0915   BASOSABS 0.1 07/04/2017 0915   . CMP Latest Ref Rng & Units 07/04/2017 05/02/2017 04/08/2017  Glucose 70 - 140 mg/dl 165(H) 170(H) 185(H)  BUN 7.0 - 26.0 mg/dL 13.0 11.0 9.8  Creatinine 0.6 - 1.1 mg/dL 0.8 0.8 0.8  Sodium 136 - 145 mEq/L 139 140 135(L)  Potassium 3.5 - 5.1 mEq/L 4.3 4.1 4.3  CO2 22 - 29 mEq/L 24 27 23   Calcium 8.4 - 10.4 mg/dL 9.6 10.0 9.7  Total Protein 6.4 - 8.3 g/dL 7.4 7.3 7.2  Total Bilirubin 0.20 - 1.20 mg/dL 0.47 0.52  0.36  Alkaline Phos 40 - 150 U/L 75 70 71  AST 5 - 34 U/L 26 22 18   ALT 0 - 55 U/L 22 18 15    . Lab Results  Component Value Date   LDH 149 05/02/2017   Component     Latest Ref Rng & Units 11/12/2016  Hep C Virus Ab     0.0 - 0.9 s/co ratio <0.1  Hepatitis B Surface Ag     Negative Negative  Hep B Core Ab, Tot     Negative Negative       *[place]*                  *[address] [employer] [address]                        [place], [place] [address]                            [phone]  ------------------------------------------------------------------- Transthoracic Echocardiography  Patient:    Katelyn Reeves, Katelyn Reeves MR #:       [id number] Study Date: 11/19/2016 Gender:     F Age:        44 Height:     160 cm Weight:     90.2 kg BSA:        2.04 m^2 Pt. Status: Room:   SONOGRAPHER  Tresa Res, RDCS  PERFORMING   [facility], Outpatient  ATTENDING    Roselee Nova     [place], [place] Kish[place]  REFERRING    [place], [place] Kishore  cc:  ------------------------------------------------------------------- LV EF: 60% -   65%  ------------------------------------------------------------------- Indications:      V58.11 Chemotherapy Evaluation.  ------------------------------------------------------------------- History:   Risk factors:  Lymphoma. Obese.  ------------------------------------------------------------------- Study Conclusions  - Left ventricle: The cavity size was normal. There was mild   concentric hypertrophy. Systolic function was normal. The   estimated ejection fraction was in the range of 60% to 65%. Wall   motion was normal; there were no regional wall motion   abnormalities. Doppler parameters are consistent with abnormal   left ventricular relaxation (grade 1 diastolic dysfunction).   Doppler parameters are consistent with indeterminate ventricular   filling pressure. - Aortic valve: Transvalvular velocity was within the normal range.   There was no stenosis. There was trivial regurgitation. - Mitral valve: Transvalvular velocity was within the normal range.   There was no evidence for stenosis. There was trivial   regurgitation. - Right ventricle: The cavity size was normal. Wall thickness was   normal. Systolic function was normal. - Atrial septum: No defect or patent foramen ovale was identified   by color flow Doppler. - Tricuspid valve: There was no regurgitation. - Global longitudinal strain -17.6%   RADIOGRAPHIC STUDIES: I have personally reviewed the radiological images as listed and agreed with the findings in the report. No results found.  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  Presented with Generalized  FDG avid lymphadenopathy - predominantly in the abdomen/Retroperitoneum. Also noted to have left supraclavicular and bilateral  axillary left more than right FDG avid lymphadenopathy. Based on imaging this would represent at least Stage III disease if this were a lymphoma. LDH level is not significantly elevated. Blood counts are stable. PET/CT scan does show fairly active disease which is FDG avid with FDG avid splenomegaly. Patient has some constitutional symptoms with about a 10 pound weight loss and some night sweats. Hepatitis profile negative. No other obvious new focal symptoms.  Patient has had an ECHO which shows nl EF  Patient is status post 6 cycles of R CHOP with no prohibitive toxicities.  PET/CT on 8/72018 - showed complete metabolic response with no metabolically active disease. A few borderline lymph nodes in the retroperitoneum.  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 Patient notes is tolerable and would not like to consider Cymbalta at this time. She will empirically use B complex 1 tablet daily.  #3 right shoulder pains, likely related to muscle strain Plan -patient is doing well and has no clinical or lab evidence of lymphoma recurrence/progression at this time. -We discussed pros and cons of maintenance Rituxan and she is agreeable with this. She will proceed with his 1st dose of Maintenance Rituxan today -Cancer rehab referral was previously given for strength and endurance training -KT tape for her shoulder pain. Encouraged heat, ice, stretching, OTC analgesics.  -I did discuss with her about removal of her port-o-cath. She will think about it.  RTC for PCV vaccine in 3-4 weeks and port flush Continue maintenance Rituxan q60 days RTC with Dr Irene Limbo with next maintenance Rituxan with labs in 60 days Will plan to rpt CT chest/abd/pelvis in about 6 months post PET/CT to evaluation for [place]   All of the patients questions were answered with  apparent satisfaction. The patient knows to call the clinic with any problems, questions or concerns.  I spent 20 minutes counseling the patient face to face. The total time spent in the appointment was 25 minutes and more than 50% was on counseling and direct patient cares.    Katelyn Lone MD [place] AAHIVMS [facility] Hematology/Oncology Physician [place]  (Office):       [phone] (Work cell):  [phone] (Fax):           [phone]  This document serves as a record of services personally performed by Katelyn Lone, MD. It was created on his behalf by Reola Mosher, a trained medical scribe. The creation of this record is based on the scribe's personal observations and the provider's statements to them. This document has been checked and approved by the attending provider.

## 2017-07-04 NOTE — Patient Instructions (Signed)
[facility] Discharge Instructions for Patients Receiving Chemotherapy  Today you received the following chemotherapy agents: Rituxan   To help prevent nausea and vomiting after your treatment, we encourage you to take your nausea medication as directed.    If you develop nausea and vomiting that is not controlled by your nausea medication, call the clinic.   BELOW ARE SYMPTOMS THAT SHOULD BE REPORTED IMMEDIATELY:  *FEVER GREATER THAN 100.5 F  *CHILLS WITH OR WITHOUT FEVER  NAUSEA AND VOMITING THAT IS NOT CONTROLLED WITH YOUR NAUSEA MEDICATION  *UNUSUAL SHORTNESS OF BREATH  *UNUSUAL BRUISING OR BLEEDING  TENDERNESS IN MOUTH AND THROAT WITH OR WITHOUT PRESENCE OF ULCERS  *URINARY PROBLEMS  *BOWEL PROBLEMS  UNUSUAL RASH Items with * indicate a potential emergency and should be followed up as soon as possible.  Feel free to call the clinic you have any questions or concerns. The clinic phone number is [phone].  Please show the CHEMO ALERT CARD at check-in to the Emergency Department and triage nurse.   

## 2017-07-04 NOTE — Telephone Encounter (Signed)
Gave avs and calendar for december

## 2017-07-25 ENCOUNTER — Ambulatory Visit (HOSPITAL_BASED_OUTPATIENT_CLINIC_OR_DEPARTMENT_OTHER): Payer: BLUE CROSS/BLUE SHIELD

## 2017-07-25 VITALS — BP 127/80 | HR 81 | Temp 98.0°F | Resp 16

## 2017-07-25 DIAGNOSIS — Z23 Encounter for immunization: Secondary | ICD-10-CM | POA: Diagnosis not present

## 2017-07-25 MED ORDER — PNEUMOCOCCAL VAC POLYVALENT 25 MCG/0.5ML IJ INJ
0.5000 mL | INJECTION | Freq: Once | INTRAMUSCULAR | Status: AC
  Administered 2017-07-25: 0.5 mL via INTRAMUSCULAR
  Filled 2017-07-25: qty 0.5

## 2017-07-28 ENCOUNTER — Ambulatory Visit: Payer: BLUE CROSS/BLUE SHIELD

## 2017-07-29 ENCOUNTER — Telehealth: Payer: Self-pay

## 2017-07-29 ENCOUNTER — Other Ambulatory Visit: Payer: Self-pay

## 2017-07-29 ENCOUNTER — Other Ambulatory Visit: Payer: Self-pay | Admitting: *Deleted

## 2017-07-29 DIAGNOSIS — Z95828 Presence of other vascular implants and grafts: Secondary | ICD-10-CM

## 2017-07-29 NOTE — Telephone Encounter (Signed)
Received phone call from Maudie Mercury, Therapist, sports at [facility]. No orders seen for port flush for pt. New orders placed in sign and held. Upon return call, orders had already been found in sign and held that were previously placed. Kim, RN questioned if this would need to be a recurring order. Flush appt at [facility] created upon pt request. Potential add-on appt as needed in the future.

## 2017-07-30 ENCOUNTER — Encounter (HOSPITAL_COMMUNITY)
Admission: RE | Admit: 2017-07-30 | Discharge: 2017-07-30 | Disposition: A | Discharge status: Home / Self Care | Payer: BLUE CROSS/BLUE SHIELD | Source: Ambulatory Visit | Attending: Hematology | Admitting: Hematology

## 2017-07-30 DIAGNOSIS — C8220 Follicular lymphoma grade III, unspecified, unspecified site: Secondary | ICD-10-CM | POA: Diagnosis not present

## 2017-07-30 DIAGNOSIS — G629 Polyneuropathy, unspecified: Secondary | ICD-10-CM | POA: Insufficient documentation

## 2017-07-30 DIAGNOSIS — Z882 Allergy status to sulfonamides status: Secondary | ICD-10-CM | POA: Insufficient documentation

## 2017-07-30 DIAGNOSIS — Q5128 Other doubling of uterus, other specified: Secondary | ICD-10-CM | POA: Diagnosis not present

## 2017-07-30 DIAGNOSIS — K5900 Constipation, unspecified: Secondary | ICD-10-CM | POA: Insufficient documentation

## 2017-07-30 DIAGNOSIS — N6019 Diffuse cystic mastopathy of unspecified breast: Secondary | ICD-10-CM | POA: Insufficient documentation

## 2017-07-30 DIAGNOSIS — Z79899 Other long term (current) drug therapy: Secondary | ICD-10-CM | POA: Insufficient documentation

## 2017-07-30 DIAGNOSIS — Z01818 Encounter for other preprocedural examination: Secondary | ICD-10-CM | POA: Insufficient documentation

## 2017-07-30 DIAGNOSIS — M25511 Pain in right shoulder: Secondary | ICD-10-CM | POA: Insufficient documentation

## 2017-07-30 DIAGNOSIS — Z9889 Other specified postprocedural states: Secondary | ICD-10-CM | POA: Diagnosis not present

## 2017-07-30 DIAGNOSIS — Z90710 Acquired absence of both cervix and uterus: Secondary | ICD-10-CM | POA: Diagnosis not present

## 2017-07-30 DIAGNOSIS — Z8041 Family history of malignant neoplasm of ovary: Secondary | ICD-10-CM | POA: Diagnosis not present

## 2017-07-30 DIAGNOSIS — I10 Essential (primary) hypertension: Secondary | ICD-10-CM | POA: Insufficient documentation

## 2017-07-30 DIAGNOSIS — Z95828 Presence of other vascular implants and grafts: Secondary | ICD-10-CM

## 2017-07-30 MED ORDER — HEPARIN SOD (PORK) LOCK FLUSH 100 UNIT/ML IV SOLN
500.0000 [IU] | Freq: Once | INTRAVENOUS | Status: AC
  Administered 2017-07-30: 500 [IU] via INTRAVENOUS

## 2017-07-30 MED ORDER — SODIUM CHLORIDE 0.9% FLUSH
INTRAVENOUS | Status: AC
  Filled 2017-07-30: qty 10

## 2017-07-30 MED ORDER — SODIUM CHLORIDE 0.9% FLUSH
10.0000 mL | Freq: Once | INTRAVENOUS | Status: AC
  Administered 2017-07-30: 10 mL via INTRAVENOUS

## 2017-07-30 MED ORDER — HEPARIN SOD (PORK) LOCK FLUSH 100 UNIT/ML IV SOLN
INTRAVENOUS | Status: AC
  Filled 2017-07-30: qty 5

## 2017-08-29 ENCOUNTER — Telehealth: Payer: Self-pay | Admitting: *Deleted

## 2017-08-29 ENCOUNTER — Telehealth: Payer: Self-pay | Admitting: Hematology

## 2017-08-29 ENCOUNTER — Encounter (INDEPENDENT_AMBULATORY_CARE_PROVIDER_SITE_OTHER): Payer: Self-pay

## 2017-08-29 NOTE — Telephone Encounter (Signed)
Sw pt regarding upcoming inclement weather.  Pt wishes to reschedule all apts to Tuesday or Wednesday if possible.  High priority message sent to scheduling to reschedule apts.

## 2017-08-29 NOTE — Telephone Encounter (Signed)
Scheduled message per 12/7 sch msg. Left voicemail for patient regarding appts that were added.

## 2017-09-01 ENCOUNTER — Ambulatory Visit: Payer: BLUE CROSS/BLUE SHIELD

## 2017-09-01 ENCOUNTER — Other Ambulatory Visit: Payer: BLUE CROSS/BLUE SHIELD

## 2017-09-01 ENCOUNTER — Ambulatory Visit: Payer: BLUE CROSS/BLUE SHIELD | Admitting: Hematology

## 2017-09-02 ENCOUNTER — Encounter (INDEPENDENT_AMBULATORY_CARE_PROVIDER_SITE_OTHER): Payer: Self-pay

## 2017-09-04 ENCOUNTER — Encounter: Payer: Self-pay | Admitting: Hematology

## 2017-09-04 ENCOUNTER — Ambulatory Visit (HOSPITAL_BASED_OUTPATIENT_CLINIC_OR_DEPARTMENT_OTHER): Payer: BLUE CROSS/BLUE SHIELD | Admitting: Hematology

## 2017-09-04 ENCOUNTER — Other Ambulatory Visit (HOSPITAL_BASED_OUTPATIENT_CLINIC_OR_DEPARTMENT_OTHER): Payer: BLUE CROSS/BLUE SHIELD

## 2017-09-04 ENCOUNTER — Ambulatory Visit (HOSPITAL_BASED_OUTPATIENT_CLINIC_OR_DEPARTMENT_OTHER): Payer: BLUE CROSS/BLUE SHIELD

## 2017-09-04 ENCOUNTER — Telehealth: Payer: Self-pay | Admitting: Hematology

## 2017-09-04 VITALS — BP 149/88 | HR 80 | Temp 98.9°F | Resp 17

## 2017-09-04 VITALS — BP 141/89 | HR 79 | Temp 98.6°F | Resp 16 | Wt 196.2 lb

## 2017-09-04 DIAGNOSIS — T451X5A Adverse effect of antineoplastic and immunosuppressive drugs, initial encounter: Secondary | ICD-10-CM

## 2017-09-04 DIAGNOSIS — C8248 Follicular lymphoma grade IIIb, lymph nodes of multiple sites: Secondary | ICD-10-CM

## 2017-09-04 DIAGNOSIS — G629 Polyneuropathy, unspecified: Secondary | ICD-10-CM | POA: Diagnosis not present

## 2017-09-04 DIAGNOSIS — M25511 Pain in right shoulder: Secondary | ICD-10-CM | POA: Diagnosis not present

## 2017-09-04 DIAGNOSIS — G620 Drug-induced polyneuropathy: Secondary | ICD-10-CM

## 2017-09-04 DIAGNOSIS — Z5112 Encounter for antineoplastic immunotherapy: Secondary | ICD-10-CM | POA: Diagnosis not present

## 2017-09-04 DIAGNOSIS — Z7189 Other specified counseling: Secondary | ICD-10-CM

## 2017-09-04 LAB — CBC & DIFF AND RETIC
BASO%: 0.8 % (ref 0.0–2.0)
Basophils Absolute: 0 10*3/uL (ref 0.0–0.1)
EOS ABS: 0.2 10*3/uL (ref 0.0–0.5)
EOS%: 3.6 % (ref 0.0–7.0)
HCT: 36.6 % (ref 34.8–46.6)
HEMOGLOBIN: 12.6 g/dL (ref 11.6–15.9)
IMMATURE RETIC FRACT: 3.1 % (ref 1.60–10.00)
LYMPH%: 22.5 % (ref 14.0–49.7)
MCH: 30.4 pg (ref 25.1–34.0)
MCHC: 34.4 g/dL (ref 31.5–36.0)
MCV: 88.4 fL (ref 79.5–101.0)
MONO#: 0.3 10*3/uL (ref 0.1–0.9)
MONO%: 6.1 % (ref 0.0–14.0)
NEUT#: 3.3 10*3/uL (ref 1.5–6.5)
NEUT%: 67 % (ref 38.4–76.8)
PLATELETS: 222 10*3/uL (ref 145–400)
RBC: 4.14 10*6/uL (ref 3.70–5.45)
RDW: 13.1 % (ref 11.2–14.5)
Retic %: 1.12 % (ref 0.70–2.10)
Retic Ct Abs: 46.37 10*3/uL (ref 33.70–90.70)
WBC: 4.9 10*3/uL (ref 3.9–10.3)
lymph#: 1.1 10*3/uL (ref 0.9–3.3)

## 2017-09-04 LAB — LACTATE DEHYDROGENASE: LDH: 153 U/L (ref 125–245)

## 2017-09-04 LAB — COMPREHENSIVE METABOLIC PANEL
ALBUMIN: 4.1 g/dL (ref 3.5–5.0)
ALT: 17 U/L (ref 0–55)
ANION GAP: 11 meq/L (ref 3–11)
AST: 22 U/L (ref 5–34)
Alkaline Phosphatase: 77 U/L (ref 40–150)
BUN: 15.6 mg/dL (ref 7.0–26.0)
CALCIUM: 9.4 mg/dL (ref 8.4–10.4)
CO2: 22 mEq/L (ref 22–29)
Chloride: 106 mEq/L (ref 98–109)
Creatinine: 0.7 mg/dL (ref 0.6–1.1)
Glucose: 136 mg/dl (ref 70–140)
Potassium: 4.2 mEq/L (ref 3.5–5.1)
Sodium: 139 mEq/L (ref 136–145)
TOTAL PROTEIN: 7.4 g/dL (ref 6.4–8.3)
Total Bilirubin: 0.42 mg/dL (ref 0.20–1.20)

## 2017-09-04 MED ORDER — SODIUM CHLORIDE 0.9 % IV SOLN
375.0000 mg/m2 | Freq: Once | INTRAVENOUS | Status: AC
  Administered 2017-09-04: 700 mg via INTRAVENOUS
  Filled 2017-09-04: qty 50

## 2017-09-04 MED ORDER — SODIUM CHLORIDE 0.9% FLUSH
10.0000 mL | INTRAVENOUS | Status: DC | PRN
  Administered 2017-09-04: 10 mL
  Filled 2017-09-04: qty 10

## 2017-09-04 MED ORDER — DIPHENHYDRAMINE HCL 25 MG PO CAPS
50.0000 mg | ORAL_CAPSULE | Freq: Once | ORAL | Status: AC
  Administered 2017-09-04: 50 mg via ORAL

## 2017-09-04 MED ORDER — ACETAMINOPHEN 325 MG PO TABS
ORAL_TABLET | ORAL | Status: AC
  Filled 2017-09-04: qty 2

## 2017-09-04 MED ORDER — ACETAMINOPHEN 325 MG PO TABS
650.0000 mg | ORAL_TABLET | Freq: Once | ORAL | Status: AC
  Administered 2017-09-04: 650 mg via ORAL

## 2017-09-04 MED ORDER — SODIUM CHLORIDE 0.9 % IV SOLN
Freq: Once | INTRAVENOUS | Status: AC
  Administered 2017-09-04: 12:00:00 via INTRAVENOUS

## 2017-09-04 MED ORDER — HEPARIN SOD (PORK) LOCK FLUSH 100 UNIT/ML IV SOLN
500.0000 [IU] | Freq: Once | INTRAVENOUS | Status: AC | PRN
  Administered 2017-09-04: 500 [IU]
  Filled 2017-09-04: qty 5

## 2017-09-04 MED ORDER — DIPHENHYDRAMINE HCL 25 MG PO CAPS
ORAL_CAPSULE | ORAL | Status: AC
  Filled 2017-09-04: qty 2

## 2017-09-04 NOTE — Telephone Encounter (Signed)
Gave avs and calendar for February 2019

## 2017-09-04 NOTE — Patient Instructions (Signed)
[facility] Discharge Instructions for Patients Receiving Chemotherapy  Today you received the following chemotherapy agents: Rituxan   To help prevent nausea and vomiting after your treatment, we encourage you to take your nausea medication as directed.    If you develop nausea and vomiting that is not controlled by your nausea medication, call the clinic.   BELOW ARE SYMPTOMS THAT SHOULD BE REPORTED IMMEDIATELY:  *FEVER GREATER THAN 100.5 F  *CHILLS WITH OR WITHOUT FEVER  NAUSEA AND VOMITING THAT IS NOT CONTROLLED WITH YOUR NAUSEA MEDICATION  *UNUSUAL SHORTNESS OF BREATH  *UNUSUAL BRUISING OR BLEEDING  TENDERNESS IN MOUTH AND THROAT WITH OR WITHOUT PRESENCE OF ULCERS  *URINARY PROBLEMS  *BOWEL PROBLEMS  UNUSUAL RASH Items with * indicate a potential emergency and should be followed up as soon as possible.  Feel free to call the clinic you have any questions or concerns. The clinic phone number is [phone].  Please show the CHEMO ALERT CARD at check-in to the Emergency Department and triage nurse.   

## 2017-09-04 NOTE — Patient Instructions (Signed)
Thank you for choosing [facility] to provide your oncology and hematology care.  To afford each patient quality time with our providers, please arrive 30 minutes before your scheduled appointment time.  If you arrive late for your appointment, you may be asked to reschedule.  We strive to give you quality time with our providers, and arriving late affects you and other patients whose appointments are after yours.   If you are a no show for multiple scheduled visits, you may be dismissed from the clinic at the providers discretion.    Again, thank you for choosing [facility], our hope is that these requests will decrease the amount of time that you wait before being seen by our physicians.  ______________________________________________________________________  Should you have questions after your visit to the [facility], please contact our office at [phone] between the hours of 8:30 and 4:30 p.m.    Voicemails left after 4:30p.m will not be returned until the following business day.    For prescription refill requests, please have your pharmacy contact us directly.  Please also try to allow 48 hours for prescription requests.    Please contact the scheduling department for questions regarding scheduling.  For scheduling of procedures such as PET scans, CT scans, MRI, Ultrasound, etc please contact central scheduling at [phone].    Resources For Cancer Patients and Caregivers:   Oncolink.org:  A wonderful resource for patients and healthcare providers for information regarding your disease, ways to tract your treatment, what to expect, etc.     American Cancer Society:  800-227-2345  Can help patients locate various types of support and financial assistance  Cancer Care: 1-800-813-HOPE (4673) Provides financial assistance, online support groups, medication/co-pay assistance.    [place] DSS:  336-641-3447 Where to apply for food  stamps, Medicaid, and utility assistance  Medicare Rights Center: 800-333-4114 Helps people with Medicare understand their rights and benefits, navigate the Medicare system, and secure the quality healthcare they deserve  SCAT: 336-333-6589 [place] [employer]'s shared-ride transportation service for eligible riders who have a disability that prevents them from riding the fixed route bus.    For additional information on assistance programs please contact our social worker:   Grier Hock/Abigail Elmore:  [phone]            

## 2017-09-07 NOTE — Progress Notes (Signed)
HEMATOLOGY/ONCOLOGY CLINIC NOTE  Date of Service: 09/07/17  Patient Care Team: Asencion Noble, MD as PCP - General (Internal Medicine)  CHIEF COMPLAINTS/PURPOSE OF CONSULTATION:   f/u for high grade follicular lymphoma  HISTORY OF PRESENTING ILLNESS:   Katelyn Reeves is a wonderful 66 y.o. female who has been referred to [place] by Dr .Asencion Noble, MD for evaluation and management of suspected non-Hodgkin's lymphoma.  Patient notes that she presented with back pain for 4-6 weeks and eventually had pain radiating to her right inguinal area. She notes that she has had interstitial cystitis in the past. She was thought to have a urinary tract infection and was treated with Macrodantin. Says the pain got worse and started radiating into the right groin she had a CT of the abdomen renal stone protocol on 10/25/2016 which showed extensive retroperitoneal lymphadenopathy concerning for lymphoproliferative disorder. She was incidentally noted to have a nodular border of the liver with possible liver cirrhosis. No urinary stones noted.  She subsequently had a PET CT scan ordered by her primary care physician which was done on 11/07/2016. This showed diffuse hypermetabolic spleen with mild splenomegaly. Hypermetabolic adenopathy in the left supraclavicular, bilateral axillary, peripancreatic, gastrohepatic ligament, celiac, porta hepatis, periaortic, and left external iliac chains. Hypermetabolic mesenteric lymph nodes. Appearance favors lymphoma.  Patient notes some night sweats for 2 weeks. Has lost about 10 pounds in the last month. No acute fevers or chills. Notes the back pain is controlled with when necessary Vicodin.  INTERVAL HISTORY  Katelyn Reeves is here for her scheduled follow-up. She has been doing well overall. She notes no acute new concerns. No fevers/chills/nightsweats. No issues with infections. No reported issues with Rituxan. Hair starting to grow back.  MEDICAL HISTORY:  #1  fibrocystic disease of the breast #2 hypertension #3 constipation #4 duplicated right sided ureters #5 imaging concern for possible liver cirrhosis  SURGICAL HISTORY:  #1 total abdominal hysterectomy with bilateral salpingo-oophorectomy for ovarian cyst. #2 Lipoma removal left upper back #3 hemorrhoidectomy  SOCIAL HISTORY: Social History   Socioeconomic History  . Marital status: Divorced    Spouse name: Not on file  . Number of children: Not on file  . Years of education: Not on file  . Highest education level: Not on file  Social Needs  . Financial resource strain: Not on file  . Food insecurity - worry: Not on file  . Food insecurity - inability: Not on file  . Transportation needs - medical: Not on file  . Transportation needs - non-medical: Not on file  Occupational History  . Not on file  Tobacco Use  . Smoking status: Never Smoker  . Smokeless tobacco: Never Used  Substance and Sexual Activity  . Alcohol use: No  . Drug use: No  . Sexual activity: Not on file  Other Topics Concern  . Not on file  Social History Narrative  . Not on file  Nonsmoker no issues with alcohol use or drug use. 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   FAMILY HISTORY:  Notes her mother had ovarian cancer followed by leukemia and died at age 28 years Maternal aunt gastric cancer  ALLERGIES:  is allergic to sulfa antibiotics.  MEDICATIONS:  Current Outpatient Medications  Medication Sig Dispense Refill  . amoxicillin (AMOXIL) 500 MG capsule Take 500 mg by mouth 3 (three) times daily.    Marland Kitchen HYDROcodone-acetaminophen (NORCO/VICODIN) 5-325 MG tablet as needed.  0  No current facility-administered medications for this visit.    Facility-Administered Medications Ordered in Other Visits  Medication Dose Route Frequency Provider Last Rate Last Dose  . sodium chloride flush (NS) 0.9 % injection 10 mL  10 mL Intracatheter PRN Brunetta Genera, MD   10 mL at 07/04/17 1540    REVIEW OF SYSTEMS:    10 Point review of Systems was done is negative except as noted above.  PHYSICAL EXAMINATION: ECOG PERFORMANCE STATUS: 1 - Symptomatic but completely ambulatory  . Vitals:   09/04/17 1015  BP: (!) 141/89  Pulse: 79  Resp: 16  Temp: 98.6 F (37 C)  SpO2: 98%   Filed Weights   09/04/17 1015  Weight: 196 lb 3.2 oz (89 kg)   .Body mass index is 35.89 kg/m.  GENERAL:alert, in no acute distress and comfortable SKIN: skin color, texture, turgor are normal, no rashes or significant lesions EYES: normal, conjunctiva are pink and non-injected, sclera clear OROPHARYNX:no exudate, no erythema and lips, buccal mucosa, and tongue normal  NECK: supple, no JVD, thyroid normal size, non-tender, without nodularity LYMPH: palpable left supraclavicular and left axillary lymph node. Smaller right axillary lymph node. LUNGS: clear to auscultation with normal respiratory effort HEART: regular rate & rhythm,  no murmurs and no lower extremity edema ABDOMEN: abdomen soft, non-tender, normoactive bowel sounds , mild tenderness to palpation in left upper abdomen. Musculoskeletal: no cyanosis of digits and no clubbing  PSYCH: alert & oriented x 3 with fluent speech NEURO: no focal motor/sensory deficits  LABORATORY DATA:  I have reviewed the data as listed  . CBC Latest Ref Rng & Units 09/04/2017 07/04/2017 05/02/2017  WBC 3.9 - 10.3 10e3/uL 4.9 4.7 4.5  Hemoglobin 11.6 - 15.9 g/dL 12.6 12.4 11.7  Hematocrit 34.8 - 46.6 % 36.6 36.2 34.4(L)  Platelets 145 - 400 10e3/uL 222 229 217   CBC    Component Value Date/Time   WBC 4.9 09/04/2017 1006   WBC 5.0 12/20/2016 1141   RBC 4.14 09/04/2017 1006   RBC 4.16 12/20/2016 1141   HGB 12.6 09/04/2017 1006   HCT 36.6 09/04/2017 1006   PLT 222 09/04/2017 1006   MCV 88.4 09/04/2017 1006   MCH 30.4 09/04/2017 1006   MCH 29.1 12/20/2016 1141   MCHC 34.4 09/04/2017 1006   MCHC 33.8  12/20/2016 1141   RDW 13.1 09/04/2017 1006   LYMPHSABS 1.1 09/04/2017 1006   MONOABS 0.3 09/04/2017 1006   EOSABS 0.2 09/04/2017 1006   BASOSABS 0.0 09/04/2017 1006   . CMP Latest Ref Rng & Units 09/04/2017 07/04/2017 05/02/2017  Glucose 70 - 140 mg/dl 136 165(H) 170(H)  BUN 7.0 - 26.0 mg/dL 15.6 13.0 11.0  Creatinine 0.6 - 1.1 mg/dL 0.7 0.8 0.8  Sodium 136 - 145 mEq/L 139 139 140  Potassium 3.5 - 5.1 mEq/L 4.2 4.3 4.1  CO2 22 - 29 mEq/L 22 24 27   Calcium 8.4 - 10.4 mg/dL 9.4 9.6 10.0  Total Protein 6.4 - 8.3 g/dL 7.4 7.4 7.3  Total Bilirubin 0.20 - 1.20 mg/dL 0.42 0.47 0.52  Alkaline Phos 40 - 150 U/L 77 75 70  AST 5 - 34 U/L 22 26 22   ALT 0 - 55 U/L 17 22 18    . Lab Results  Component Value Date   LDH 153 09/04/2017   Component     Latest Ref Rng & Units 11/12/2016  Hep C Virus Ab     0.0 - 0.9 s/co ratio <0.1  Hepatitis  B Surface Ag     Negative Negative  Hep B Core Ab, Tot     Negative Negative       *[facility]*                  *[facility] [employer]                        [place], [place] [address]                            [phone]  ------------------------------------------------------------------- Transthoracic Echocardiography  Patient:    Katelyn Reeves, Katelyn Reeves MR #:       [id number] Study Date: 11/19/2016 Gender:     F Age:        43 Height:     160 cm Weight:     90.2 kg BSA:        2.04 m^2 Pt. Status: Room:   SONOGRAPHER  Tresa Res, RDCS  PERFORMING   [facility], Outpatient  ATTENDING    Roselee Nova     [place], [place] Kishore  REFERRING    [place], [place] Kishore  cc:  ------------------------------------------------------------------- LV EF: 60% -   65%  ------------------------------------------------------------------- Indications:      V58.11 Chemotherapy Evaluation.  ------------------------------------------------------------------- History:   Risk factors:   Lymphoma. Obese.  ------------------------------------------------------------------- Study Conclusions  - Left ventricle: The cavity size was normal. There was mild   concentric hypertrophy. Systolic function was normal. The   estimated ejection fraction was in the range of 60% to 65%. Wall   motion was normal; there were no regional wall motion   abnormalities. Doppler parameters are consistent with abnormal   left ventricular relaxation (grade 1 diastolic dysfunction).   Doppler parameters are consistent with indeterminate ventricular   filling pressure. - Aortic valve: Transvalvular velocity was within the normal range.   There was no stenosis. There was trivial regurgitation. - Mitral valve: Transvalvular velocity was within the normal range.   There was no evidence for stenosis. There was trivial   regurgitation. - Right ventricle: The cavity size was normal. Wall thickness was   normal. Systolic function was normal. - Atrial septum: No defect or patent foramen ovale was identified   by color flow Doppler. - Tricuspid valve: There was no regurgitation. - Global longitudinal strain -17.6%   RADIOGRAPHIC STUDIES: I have personally reviewed the radiological images as listed and agreed with the findings in the report. No results found.  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  Presented with Generalized FDG avid lymphadenopathy - predominantly in the abdomen/Retroperitoneum. Also noted to have left supraclavicular and bilateral axillary left more than right FDG avid lymphadenopathy. Based on imaging this would represent at least Stage III disease if this were a lymphoma. LDH level is not significantly elevated. Blood counts are stable. PET/CT scan does show fairly active disease which is FDG avid  LNadenopathy and FDG avid splenomegaly. Patient has some constitutional symptoms with about a 10 pound weight loss and some night sweats. Hepatitis profile negative. No other obvious new  focal symptoms.  Patient has had an ECHO which shows nl EF  Patient is status post 6 cycles of R CHOP with no prohibitive toxicities.  PET/CT on 8/72018 - showed complete metabolic response with no metabolically active disease. A few borderline lymph nodes in the retroperitoneum.  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 Patient notes is tolerable and would not like to consider Cymbalta at this time. She will empirically use B complex 1 tablet daily.  #3 right shoulder pains, likely related to muscle strain. improved Plan -patient is doing well and has no clinical or lab evidence of lymphoma recurrence/progression at this time. -labs stable. Patient appropriate to proceed with her next dose of Maintenance Rituxan. -she has completed both her pneumococcal vaccination. -Porta-cath flush in 4 weeks at [facility]. Discussed possibly having this removed. -CT chest/abd/pelvis in 8 weeks -Continue Maintenance Rituxan q60 days RTC with Dr Irene Limbo in 2 months with next dose of Rituxan with labs  All of the patients questions were answered with apparent satisfaction. The patient knows to call the clinic with any problems, questions or concerns.  I spent 20 minutes counseling the patient face to face. The total time spent in the appointment was 25 minutes and more than 50% was on counseling and direct patient cares.    Sullivan Lone MD [place] AAHIVMS [facility] Hematology/Oncology Physician [facility]  (Office):       [phone] (Work cell):  [phone] (Fax):           [phone]

## 2017-09-10 ENCOUNTER — Encounter (INDEPENDENT_AMBULATORY_CARE_PROVIDER_SITE_OTHER): Payer: Self-pay

## 2017-09-11 ENCOUNTER — Other Ambulatory Visit: Payer: Self-pay

## 2017-09-11 DIAGNOSIS — Z95828 Presence of other vascular implants and grafts: Secondary | ICD-10-CM

## 2017-09-25 ENCOUNTER — Encounter (INDEPENDENT_AMBULATORY_CARE_PROVIDER_SITE_OTHER): Payer: Self-pay

## 2017-10-13 ENCOUNTER — Encounter (HOSPITAL_COMMUNITY)
Admission: RE | Admit: 2017-10-13 | Discharge: 2017-10-13 | Disposition: A | Discharge status: Home / Self Care | Payer: BLUE CROSS/BLUE SHIELD | Source: Ambulatory Visit | Attending: Hematology | Admitting: Hematology

## 2017-10-13 DIAGNOSIS — Z95828 Presence of other vascular implants and grafts: Secondary | ICD-10-CM | POA: Diagnosis present

## 2017-10-13 DIAGNOSIS — Z452 Encounter for adjustment and management of vascular access device: Secondary | ICD-10-CM | POA: Insufficient documentation

## 2017-10-13 MED ORDER — HEPARIN SOD (PORK) LOCK FLUSH 100 UNIT/ML IV SOLN
500.0000 [IU] | Freq: Once | INTRAVENOUS | Status: AC
  Administered 2017-10-13: 500 [IU] via INTRAVENOUS

## 2017-10-13 MED ORDER — HEPARIN SOD (PORK) LOCK FLUSH 100 UNIT/ML IV SOLN
INTRAVENOUS | Status: AC
  Filled 2017-10-13: qty 5

## 2017-10-13 MED ORDER — SODIUM CHLORIDE 0.9% FLUSH
10.0000 mL | Freq: Once | INTRAVENOUS | Status: AC
  Administered 2017-10-13: 10 mL via INTRAVENOUS

## 2017-10-27 MED FILL — Sodium Chloride Flush IV Soln 0.9%: INTRAVENOUS | Qty: 10 | Status: AC

## 2017-10-27 MED FILL — Heparin Sodium (Porcine) Lock Flush IV Soln 100 Unit/ML: INTRAVENOUS | Qty: 5 | Status: AC

## 2017-10-31 NOTE — Progress Notes (Signed)
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 female who has been referred to [place] by Dr .Asencion Noble, MD for evaluation and management of suspected non-Hodgkin's lymphoma.  Patient notes that she presented with back pain for 4-6 weeks and eventually had pain radiating to her right inguinal area. She notes that she has had interstitial cystitis in the past. She was thought to have a urinary tract infection and was treated with Macrodantin. Says the pain got worse and started radiating into the right groin she had a CT of the abdomen renal stone protocol on 10/25/2016 which showed extensive retroperitoneal lymphadenopathy concerning for lymphoproliferative disorder. She was incidentally noted to have a nodular border of the liver with possible liver cirrhosis. No urinary stones noted.  She subsequently had a PET CT scan ordered by her primary care physician which was done on 11/07/2016. This showed diffuse hypermetabolic spleen with mild splenomegaly. Hypermetabolic adenopathy in the left supraclavicular, bilateral axillary, peripancreatic, gastrohepatic ligament, celiac, porta hepatis, periaortic, and left external iliac chains. Hypermetabolic mesenteric lymph nodes. Appearance favors lymphoma.  Patient notes some night sweats for 2 weeks. Has lost about 10 pounds in the last month. No acute fevers or chills. Notes the back pain is controlled with when necessary Vicodin.  INTERVAL HISTORY  Katelyn Reeves is here for her scheduled follow-up regarding her follicular lymphoma and third maintenance dose of Rituxan. The patient's last visit with [place] was on 09/04/17. The pt reports that she is doing well overall and had a good trip to  [place], and is looking forward to trip to [place] next. She notes that her CT scan is tomorrow. She notes that the pain on her right upper arm has been significantly relieved after seeing a massage therapist and adjusting her arm postures.   Lab results today (11/03/17) of CBC, CMP, and Reticulocytes is as follows: all values are WNL. LDH 11/03/17 at 131.  On review of systems, pt reports resolved arm discomfort, and denies any other symptoms. No fevers chills night sweats or unexpected weight loss.  MEDICAL HISTORY:  #1 fibrocystic disease of the breast #2 hypertension #3 constipation #4 duplicated right sided ureters #5 imaging concern for possible liver cirrhosis  SURGICAL HISTORY:  #1 total abdominal hysterectomy with bilateral salpingo-oophorectomy for ovarian cyst. #2 Lipoma removal left upper back #3 hemorrhoidectomy  SOCIAL HISTORY: Social History   Socioeconomic History  . Marital status: Divorced    Spouse name: Not on file  . Number of children: Not on file  . Years of education: Not on file  . Highest education level: Not on file  Social Needs  . Financial resource strain: Not on file  . Food insecurity - worry: Not on file  . Food insecurity - inability: Not on file  . Transportation needs - medical: Not on file  . Transportation needs - non-medical: Not on file  Occupational History  . Not on file  Tobacco Use  . Smoking status: Never Smoker  . Smokeless tobacco: Never Used  Substance and Sexual Activity  . Alcohol use: No  . Drug use: No  . Sexual activity: Not on file  Other Topics Concern  . Not on file  Social History Narrative  . Not  on file  Nonsmoker no issues with alcohol use or drug use. 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   FAMILY HISTORY:  Notes her mother had ovarian cancer followed by leukemia and died at age 34 years Maternal aunt gastric cancer  ALLERGIES:  is allergic to sulfa  antibiotics.  MEDICATIONS:  Current Outpatient Medications  Medication Sig Dispense Refill  . amoxicillin (AMOXIL) 500 MG capsule Take 500 mg by mouth 3 (three) times daily.    Marland Kitchen HYDROcodone-acetaminophen (NORCO/VICODIN) 5-325 MG tablet as needed.  0   No current facility-administered medications for this visit.    Facility-Administered Medications Ordered in Other Visits  Medication Dose Route Frequency Provider Last Rate Last Dose  . sodium chloride flush (NS) 0.9 % injection 10 mL  10 mL Intracatheter PRN Brunetta Genera, MD   10 mL at 07/04/17 1540    REVIEW OF SYSTEMS:    .10 Point review of Systems was done is negative except as noted above.   PHYSICAL EXAMINATION: ECOG PERFORMANCE STATUS: 1 - Symptomatic but completely ambulatory  . Vitals:   11/03/17 0955  BP: 140/80  Pulse: 67  Resp: 20  Temp: 99.3 F (37.4 C)  SpO2: 99%   Filed Weights   11/03/17 0955  Weight: 195 lb 14.4 oz (88.9 kg)   .Body mass index is 35.83 kg/m. Marland Kitchen GENERAL:alert, in no acute distress and comfortable SKIN: no acute rashes, no significant lesions EYES: conjunctiva are pink and non-injected, sclera anicteric OROPHARYNX: MMM, no exudates, no oropharyngeal erythema or ulceration NECK: supple, no JVD LYMPH:  no palpable lymphadenopathy in the cervical, axillary or inguinal regions LUNGS: clear to auscultation b/l with normal respiratory effort HEART: regular rate & rhythm ABDOMEN:  normoactive bowel sounds , non tender, not distended. Extremity: no pedal edema PSYCH: alert & oriented x 3 with fluent speech NEURO: no focal motor/sensory deficits   LABORATORY DATA:  I have reviewed the data as listed  . CBC Latest Ref Rng & Units 11/03/2017 09/04/2017 07/04/2017  WBC 3.9 - 10.3 K/uL 5.6 4.9 4.7  Hemoglobin 11.6 - 15.9 g/dL - 12.6 12.4  Hematocrit 34.8 - 46.6 % 38.2 36.6 36.2  Platelets 145 - 400 K/uL 221 222 229   CBC    Component Value Date/Time   WBC 5.6 11/03/2017 0900    WBC 4.9 09/04/2017 1006   WBC 5.0 12/20/2016 1141   RBC 4.26 11/03/2017 0900   RBC 4.26 11/03/2017 0900   HGB 12.6 09/04/2017 1006   HCT 38.2 11/03/2017 0900   HCT 36.6 09/04/2017 1006   PLT 221 11/03/2017 0900   PLT 222 09/04/2017 1006   MCV 89.7 11/03/2017 0900   MCV 88.4 09/04/2017 1006   MCH 30.5 11/03/2017 0900   MCHC 34.0 11/03/2017 0900   RDW 13.1 11/03/2017 0900   RDW 13.1 09/04/2017 1006   LYMPHSABS 1.0 11/03/2017 0900   LYMPHSABS 1.1 09/04/2017 1006   MONOABS 0.4 11/03/2017 0900   MONOABS 0.3 09/04/2017 1006   EOSABS 0.2 11/03/2017 0900   EOSABS 0.2 09/04/2017 1006   BASOSABS 0.0 11/03/2017 0900   BASOSABS 0.0 09/04/2017 1006   . CMP Latest Ref Rng & Units 11/03/2017 09/04/2017 07/04/2017  Glucose 70 - 140 mg/dL 137 136 165(H)  BUN 7 - 26 mg/dL 12 15.6 13.0  Creatinine 0.60 - 1.10 mg/dL 0.79 0.7 0.8  Sodium 136 - 145 mmol/L 140 139 139  Potassium 3.5 - 5.1 mmol/L 4.2 4.2 4.3  Chloride 98 -  109 mmol/L 105 - -  CO2 22 - 29 mmol/L 26 22 24   Calcium 8.4 - 10.4 mg/dL 9.9 9.4 9.6  Total Protein 6.4 - 8.3 g/dL 7.5 7.4 7.4  Total Bilirubin 0.2 - 1.2 mg/dL 0.4 0.42 0.47  Alkaline Phos 40 - 150 U/L 79 77 75  AST 5 - 34 U/L 25 22 26   ALT 0 - 55 U/L 18 17 22    . Lab Results  Component Value Date   LDH 131 11/03/2017   Component     Latest Ref Rng & Units 11/12/2016  Hep C Virus Ab     0.0 - 0.9 s/co ratio <0.1  Hepatitis B Surface Ag     Negative Negative  Hep B Core Ab, Tot     Negative Negative       RADIOGRAPHIC STUDIES: I have personally reviewed the radiological images as listed and agreed with the findings in the report. Ct Chest W Contrast  Result Date: 11/04/2017 CLINICAL DATA:  Non-Hodgkin's lymphoma. EXAM: CT CHEST, ABDOMEN, AND PELVIS WITH CONTRAST TECHNIQUE: Multidetector CT imaging of the chest, abdomen and pelvis was performed following the standard protocol during bolus administration of intravenous contrast. CONTRAST:  139m ISOVUE-300  IOPAMIDOL (ISOVUE-300) INJECTION 61% COMPARISON:  04/29/2017 PET-CT. Abdomen and pelvis CT from 10/25/2016 FINDINGS: CT CHEST FINDINGS Cardiovascular: The heart size is normal. No pericardial effusion. Coronary artery calcification is evident. Atherosclerotic calcification is noted in the wall of the thoracic aorta. Right Port-A-Cath tip positioned in the mid SVC. Mediastinum/Nodes: No mediastinal lymphadenopathy. There is no hilar lymphadenopathy. The esophagus has normal imaging features. There is no axillary lymphadenopathy. Lungs/Pleura: No suspicious pulmonary nodule or mass. No focal airspace consolidation. No pulmonary edema or pleural effusion. Musculoskeletal: Bone windows reveal no worrisome lytic or sclerotic osseous lesions. CT ABDOMEN PELVIS FINDINGS Hepatobiliary: Subtle nodularity of hepatic contour with prominent lateral segment left liver and caudate lobe suggests underlying cirrhosis. There is no evidence for gallstones, gallbladder wall thickening, or pericholecystic fluid. No intrahepatic or extrahepatic biliary dilation. Pancreas: No focal mass lesion. No dilatation of the main duct. No intraparenchymal cyst. No peripancreatic edema. Spleen:  No splenomegaly. No focal mass lesion.   . Adrenals/Urinary Tract: No adrenal nodule or mass. 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 Left kidney unremarkable The urinary bladder appears normal for the degree of distention. Stomach/Bowel: Stomach is nondistended. No gastric wall thickening. No evidence of outlet obstruction. Duodenum is normally positioned as is the ligament of Treitz. No small bowel wall thickening. No small bowel dilatation. The terminal ileum is normal. The appendix is normal. No gross colonic mass. No colonic wall thickening. No substantial diverticular change. Vascular/Lymphatic: There is abdominal aortic atherosclerosis without aneurysm. No gastrohepatic or hepato  duodenal ligament lymphadenopathy 13 mm short axis para-aortic lymph node measured on 08/07 2018 exam is similar today measuring 12 mm short axis. 13 mm aortocaval lymph node measured on the prior study is 12 mm today. 10 mm short axis left para-aortic lymph node just above the bifurcation on today's exam measured 10-11 mm on the prior study. No pelvic sidewall lymphadenopathy. Reproductive: Uterus surgically absent.  There is no adnexal mass. Other: No intraperitoneal free fluid. Musculoskeletal: Bone windows reveal no worrisome lytic or sclerotic osseous lesions. IMPRESSION: 1. Stable exam. No new or progressive findings to suggest recurrent disease. The small abdominal retroperitoneal lymph nodes are unchanged when comparing to PET-CT of 04/29/2017. 2. Nodular liver contour raises the  question of cirrhosis. 3.  Aortic Atherosclerois (ICD10-170.0) Electronically Signed   By: Misty Stanley M.D.   On: 11/04/2017 13:12   Ct Abdomen Pelvis W Contrast  Result Date: 11/04/2017 CLINICAL DATA:  Non-Hodgkin's lymphoma. EXAM: CT CHEST, ABDOMEN, AND PELVIS WITH CONTRAST TECHNIQUE: Multidetector CT imaging of the chest, abdomen and pelvis was performed following the standard protocol during bolus administration of intravenous contrast. CONTRAST:  151m ISOVUE-300 IOPAMIDOL (ISOVUE-300) INJECTION 61% COMPARISON:  04/29/2017 PET-CT. Abdomen and pelvis CT from 10/25/2016 FINDINGS: CT CHEST FINDINGS Cardiovascular: The heart size is normal. No pericardial effusion. Coronary artery calcification is evident. Atherosclerotic calcification is noted in the wall of the thoracic aorta. Right Port-A-Cath tip positioned in the mid SVC. Mediastinum/Nodes: No mediastinal lymphadenopathy. There is no hilar lymphadenopathy. The esophagus has normal imaging features. There is no axillary lymphadenopathy. Lungs/Pleura: No suspicious pulmonary nodule or mass. No focal airspace consolidation. No pulmonary edema or pleural effusion.  Musculoskeletal: Bone windows reveal no worrisome lytic or sclerotic osseous lesions. CT ABDOMEN PELVIS FINDINGS Hepatobiliary: Subtle nodularity of hepatic contour with prominent lateral segment left liver and caudate lobe suggests underlying cirrhosis. There is no evidence for gallstones, gallbladder wall thickening, or pericholecystic fluid. No intrahepatic or extrahepatic biliary dilation. Pancreas: No focal mass lesion. No dilatation of the main duct. No intraparenchymal cyst. No peripancreatic edema. Spleen:  No splenomegaly. No focal mass lesion.   . Adrenals/Urinary Tract: No adrenal nodule or mass. 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 Left kidney unremarkable The urinary bladder appears normal for the degree of distention. Stomach/Bowel: Stomach is nondistended. No gastric wall thickening. No evidence of outlet obstruction. Duodenum is normally positioned as is the ligament of Treitz. No small bowel wall thickening. No small bowel dilatation. The terminal ileum is normal. The appendix is normal. No gross colonic mass. No colonic wall thickening. No substantial diverticular change. Vascular/Lymphatic: There is abdominal aortic atherosclerosis without aneurysm. No gastrohepatic or hepato duodenal ligament lymphadenopathy 13 mm short axis para-aortic lymph node measured on 08/07 2018 exam is similar today measuring 12 mm short axis. 13 mm aortocaval lymph node measured on the prior study is 12 mm today. 10 mm short axis left para-aortic lymph node just above the bifurcation on today's exam measured 10-11 mm on the prior study. No pelvic sidewall lymphadenopathy. Reproductive: Uterus surgically absent.  There is no adnexal mass. Other: No intraperitoneal free fluid. Musculoskeletal: Bone windows reveal no worrisome lytic or sclerotic osseous lesions. IMPRESSION: 1. Stable exam. No new or progressive findings to suggest recurrent disease.  The small abdominal retroperitoneal lymph nodes are unchanged when comparing to PET-CT of 04/29/2017. 2. Nodular liver contour raises the question of cirrhosis. 3.  Aortic Atherosclerois (ICD10-170.0) Electronically Signed   By: EMisty StanleyM.D.   On: 11/04/2017 13:12    ASSESSMENT & PLAN:   67y.o. 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  Presented with Generalized FDG avid lymphadenopathy - predominantly in the abdomen/Retroperitoneum. Also noted to have left supraclavicular and bilateral axillary left more than right FDG avid lymphadenopathy. Based on imaging this would represent at least Stage III disease if this were a lymphoma. LDH level is not significantly elevated. Blood counts are stable. PET/CT scan does show fairly active disease which is FDG avid LNadenopathy and FDG avid splenomegaly. Patient  has some constitutional symptoms with about a 10 pound weight loss and some night sweats. Hepatitis profile negative. No other obvious new focal symptoms.  Patient has had an ECHO which shows nl EF  Patient is status post 6 cycles of R CHOP with no prohibitive toxicities.  PET/CT on 8/72018 - showed complete metabolic response with no metabolically active disease. A few borderline lymph nodes in the retroperitoneum.  CT chest/abd/pelvis 11/04/2017:  No new or progressive findings to suggest recurrent disease. The small abdominal retroperitoneal lymph nodes are unchanged when comparing to PET-CT of 04/29/2017.   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 Improved with massage therapy Plan -patient is doing well and has no clinical or lab evidence of lymphoma recurrence/progression at this time. -labs stable. Patient appropriate to proceed with her next dose of  Maintenance Rituxan.  -no prohibitive toxicities from Rituxan -Rituxan orders placed and signed -she has completed both her pneumococcal vaccination as was previously discussed. -I called her on the phone after her CT on 11/04/2017 and reviewed the results in details with her .  Continue Rituxan q60 days - plz schedule next 2 infusions RTC with Dr Irene Limbo with labs q60 days with each dose of Rituxan  All of the patients questions were answered with apparent satisfaction. The patient knows to call the clinic with any problems, questions or concerns.  . The total time spent in the appointment was 25 minutes and more than 50% was on counseling and direct patient cares.    Sullivan Lone MD [place] AAHIVMS [facility] Hematology/Oncology Physician [place]  (Office):       [phone] (Work cell):  [phone] (Fax):           [phone]  This document serves as a record of services personally performed by Sullivan Lone, MD. It was created on his behalf by Baldwin Jamaica, a trained medical scribe. The creation of this record is based on the scribe's personal observations and the provider's statements to them.   .I have reviewed the above documentation for accuracy and completeness, and I agree with the above.  Brunetta Genera MD

## 2017-11-03 ENCOUNTER — Inpatient Hospital Stay: Payer: BLUE CROSS/BLUE SHIELD

## 2017-11-03 ENCOUNTER — Inpatient Hospital Stay: Payer: BLUE CROSS/BLUE SHIELD | Attending: Hematology | Admitting: Hematology

## 2017-11-03 ENCOUNTER — Telehealth: Payer: Self-pay | Admitting: Hematology

## 2017-11-03 ENCOUNTER — Ambulatory Visit: Payer: BLUE CROSS/BLUE SHIELD

## 2017-11-03 ENCOUNTER — Encounter: Payer: Self-pay | Admitting: Hematology

## 2017-11-03 VITALS — BP 140/80 | HR 67 | Temp 99.3°F | Resp 20 | Ht 62.0 in | Wt 195.9 lb

## 2017-11-03 VITALS — BP 111/67 | HR 75 | Temp 98.2°F | Resp 17

## 2017-11-03 DIAGNOSIS — C8248 Follicular lymphoma grade IIIb, lymph nodes of multiple sites: Secondary | ICD-10-CM | POA: Diagnosis not present

## 2017-11-03 DIAGNOSIS — Z5112 Encounter for antineoplastic immunotherapy: Secondary | ICD-10-CM | POA: Insufficient documentation

## 2017-11-03 DIAGNOSIS — M25511 Pain in right shoulder: Secondary | ICD-10-CM

## 2017-11-03 DIAGNOSIS — Z7189 Other specified counseling: Secondary | ICD-10-CM

## 2017-11-03 LAB — COMPREHENSIVE METABOLIC PANEL
ALK PHOS: 79 U/L (ref 40–150)
ALT: 18 U/L (ref 0–55)
AST: 25 U/L (ref 5–34)
Albumin: 4.1 g/dL (ref 3.5–5.0)
Anion gap: 9 (ref 3–11)
BILIRUBIN TOTAL: 0.4 mg/dL (ref 0.2–1.2)
BUN: 12 mg/dL (ref 7–26)
CALCIUM: 9.9 mg/dL (ref 8.4–10.4)
CO2: 26 mmol/L (ref 22–29)
CREATININE: 0.79 mg/dL (ref 0.60–1.10)
Chloride: 105 mmol/L (ref 98–109)
GFR calc non Af Amer: >60 mL/min (ref >60)
GLUCOSE: 137 mg/dL (ref 70–140)
Potassium: 4.2 mmol/L (ref 3.5–5.1)
SODIUM: 140 mmol/L (ref 136–145)
Total Protein: 7.5 g/dL (ref 6.4–8.3)

## 2017-11-03 LAB — RETICULOCYTES
RBC.: 4.26 MIL/uL (ref 3.70–5.45)
RETIC CT PCT: 1.3 % (ref 0.7–2.1)
Retic Count, Absolute: 55.4 10*3/uL (ref 33.7–90.7)

## 2017-11-03 LAB — CBC WITH DIFFERENTIAL (CANCER CENTER ONLY)
Basophils Absolute: 0 10*3/uL (ref 0.0–0.1)
Basophils Relative: 1 %
EOS ABS: 0.2 10*3/uL (ref 0.0–0.5)
Eosinophils Relative: 3 %
HCT: 38.2 % (ref 34.8–46.6)
Hemoglobin: 13 g/dL (ref 11.6–15.9)
Lymphocytes Relative: 17 %
Lymphs Abs: 1 10*3/uL (ref 0.9–3.3)
MCH: 30.5 pg (ref 25.1–34.0)
MCHC: 34 g/dL (ref 31.5–36.0)
MCV: 89.7 fL (ref 79.5–101.0)
MONO ABS: 0.4 10*3/uL (ref 0.1–0.9)
MONOS PCT: 6 %
NEUTROS PCT: 73 %
Neutro Abs: 4 10*3/uL (ref 1.5–6.5)
Platelet Count: 221 10*3/uL (ref 145–400)
RBC: 4.26 MIL/uL (ref 3.70–5.45)
RDW: 13.1 % (ref 11.2–14.5)
WBC Count: 5.6 10*3/uL (ref 3.9–10.3)

## 2017-11-03 LAB — LACTATE DEHYDROGENASE: LDH: 131 U/L (ref 125–245)

## 2017-11-03 MED ORDER — DIPHENHYDRAMINE HCL 25 MG PO CAPS
ORAL_CAPSULE | ORAL | Status: AC
  Filled 2017-11-03: qty 2

## 2017-11-03 MED ORDER — SODIUM CHLORIDE 0.9 % IV SOLN
Freq: Once | INTRAVENOUS | Status: AC
  Administered 2017-11-03: 12:00:00 via INTRAVENOUS

## 2017-11-03 MED ORDER — ACETAMINOPHEN 325 MG PO TABS
ORAL_TABLET | ORAL | Status: AC
  Filled 2017-11-03: qty 2

## 2017-11-03 MED ORDER — SODIUM CHLORIDE 0.9% FLUSH
10.0000 mL | INTRAVENOUS | Status: DC | PRN
  Administered 2017-11-03: 10 mL
  Filled 2017-11-03: qty 10

## 2017-11-03 MED ORDER — ACETAMINOPHEN 325 MG PO TABS
650.0000 mg | ORAL_TABLET | Freq: Once | ORAL | Status: AC
  Administered 2017-11-03: 650 mg via ORAL

## 2017-11-03 MED ORDER — SODIUM CHLORIDE 0.9 % IV SOLN
375.0000 mg/m2 | Freq: Once | INTRAVENOUS | Status: AC
  Administered 2017-11-03: 700 mg via INTRAVENOUS
  Filled 2017-11-03: qty 50

## 2017-11-03 MED ORDER — DIPHENHYDRAMINE HCL 25 MG PO CAPS
50.0000 mg | ORAL_CAPSULE | Freq: Once | ORAL | Status: AC
  Administered 2017-11-03: 50 mg via ORAL

## 2017-11-03 MED ORDER — HEPARIN SOD (PORK) LOCK FLUSH 100 UNIT/ML IV SOLN
500.0000 [IU] | Freq: Once | INTRAVENOUS | Status: AC | PRN
  Administered 2017-11-03: 500 [IU]
  Filled 2017-11-03: qty 5

## 2017-11-03 NOTE — Patient Instructions (Signed)
[facility] Discharge Instructions for Patients Receiving Chemotherapy  Today you received the following chemotherapy agents: Rituxan   To help prevent nausea and vomiting after your treatment, we encourage you to take your nausea medication as directed.    If you develop nausea and vomiting that is not controlled by your nausea medication, call the clinic.   BELOW ARE SYMPTOMS THAT SHOULD BE REPORTED IMMEDIATELY:  *FEVER GREATER THAN 100.5 F  *CHILLS WITH OR WITHOUT FEVER  NAUSEA AND VOMITING THAT IS NOT CONTROLLED WITH YOUR NAUSEA MEDICATION  *UNUSUAL SHORTNESS OF BREATH  *UNUSUAL BRUISING OR BLEEDING  TENDERNESS IN MOUTH AND THROAT WITH OR WITHOUT PRESENCE OF ULCERS  *URINARY PROBLEMS  *BOWEL PROBLEMS  UNUSUAL RASH Items with * indicate a potential emergency and should be followed up as soon as possible.  Feel free to call the clinic you have any questions or concerns. The clinic phone number is [phone].  Please show the CHEMO ALERT CARD at check-in to the Emergency Department and triage nurse.   

## 2017-11-03 NOTE — Telephone Encounter (Signed)
Gave patient avs and calendar with appts per 2/11 los.

## 2017-11-04 ENCOUNTER — Ambulatory Visit (HOSPITAL_COMMUNITY)
Admission: RE | Admit: 2017-11-04 | Discharge: 2017-11-04 | Disposition: A | Discharge status: Home / Self Care | Payer: BLUE CROSS/BLUE SHIELD | Source: Ambulatory Visit | Attending: Hematology | Admitting: Hematology

## 2017-11-04 DIAGNOSIS — C8248 Follicular lymphoma grade IIIb, lymph nodes of multiple sites: Secondary | ICD-10-CM | POA: Diagnosis present

## 2017-11-04 DIAGNOSIS — K769 Liver disease, unspecified: Secondary | ICD-10-CM | POA: Diagnosis not present

## 2017-11-04 DIAGNOSIS — I700 Atherosclerosis of aorta: Secondary | ICD-10-CM | POA: Diagnosis not present

## 2017-11-04 DIAGNOSIS — Z8572 Personal history of non-Hodgkin lymphomas: Secondary | ICD-10-CM | POA: Diagnosis not present

## 2017-11-04 MED ORDER — IOPAMIDOL (ISOVUE-300) INJECTION 61%
INTRAVENOUS | Status: AC
  Filled 2017-11-04: qty 100

## 2017-11-04 MED ORDER — IOPAMIDOL (ISOVUE-300) INJECTION 61%
100.0000 mL | Freq: Once | INTRAVENOUS | Status: AC | PRN
  Administered 2017-11-04: 100 mL via INTRAVENOUS

## 2017-11-13 IMAGING — US US BIOPSY
1 series · 13 of 17 positions shown · non-contrast
Comparison: none

INDICATION: 65-year-old with suspicious lymphadenopathy. Previous biopsy was
inadequate due to necrotic tissue. Findings concerning for a
lymphoproliferative process. Additional tissue sampling is needed.

[Series 1: us biopsy · 0.06mm/px · 13 of 17 slices shown]
[im 1/17]
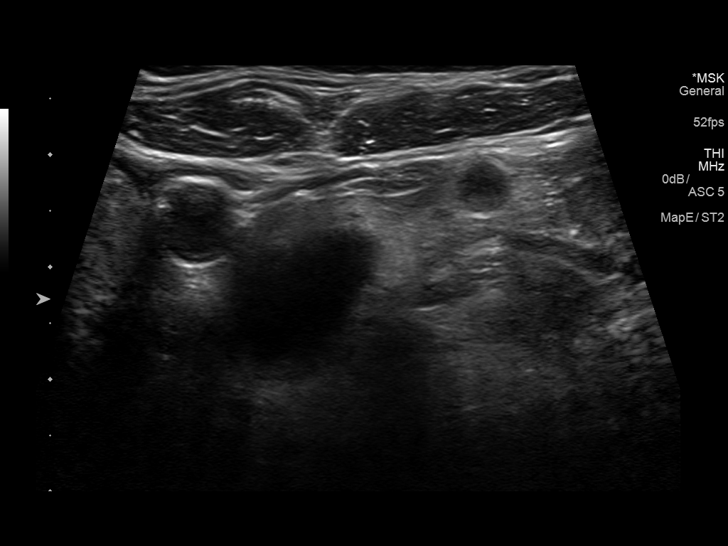
[im 2/17]
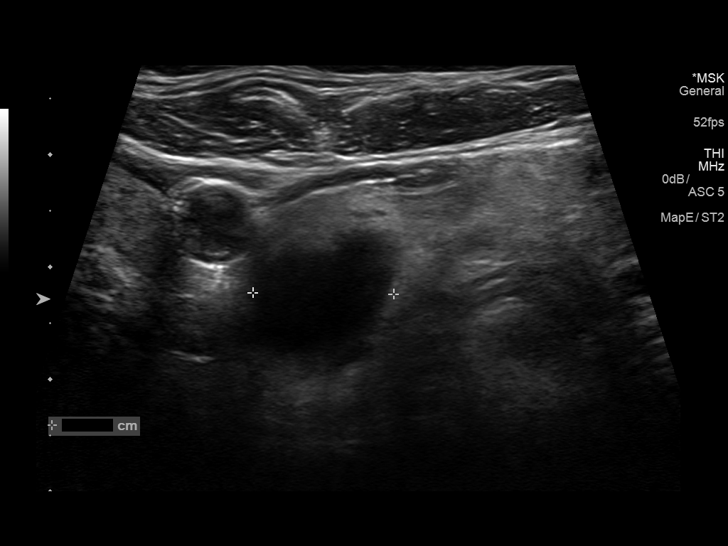
[im 4/17]
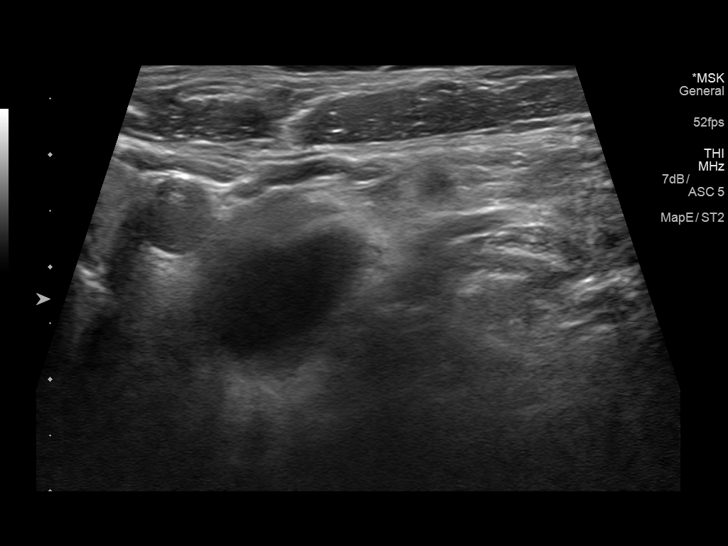
[im 5/17]
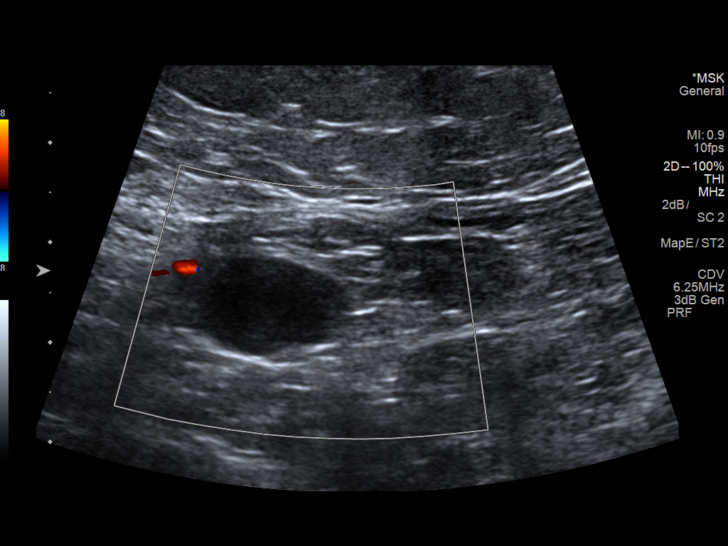
[im 6/17]
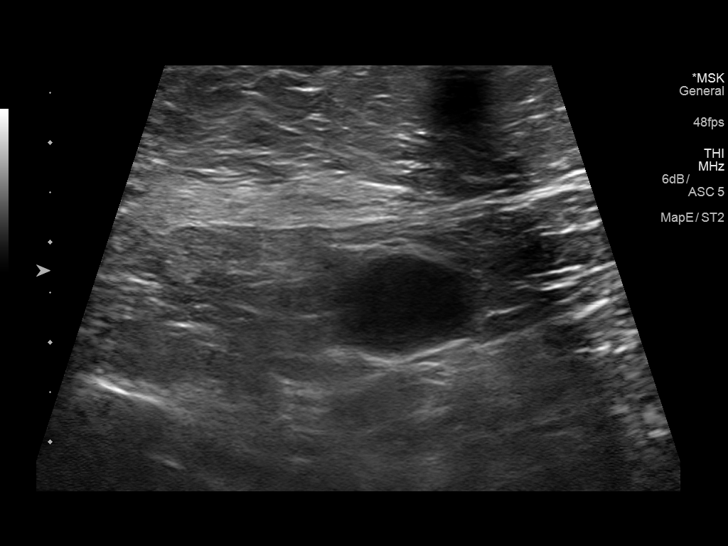
[im 8/17]
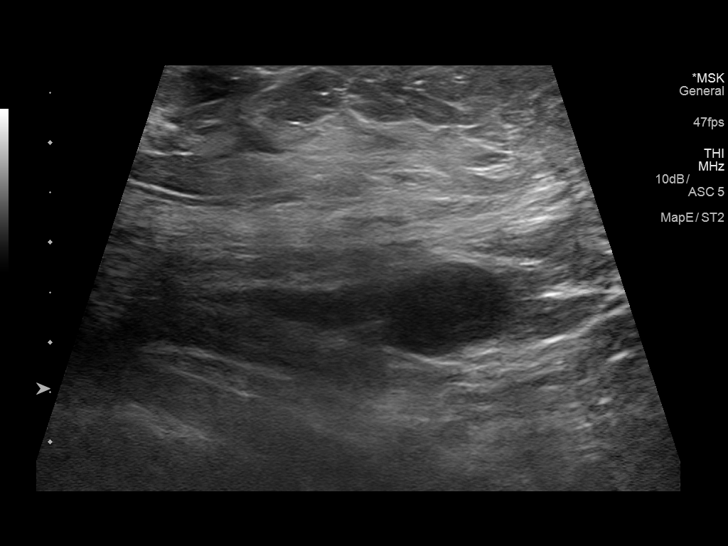
[im 9/17]
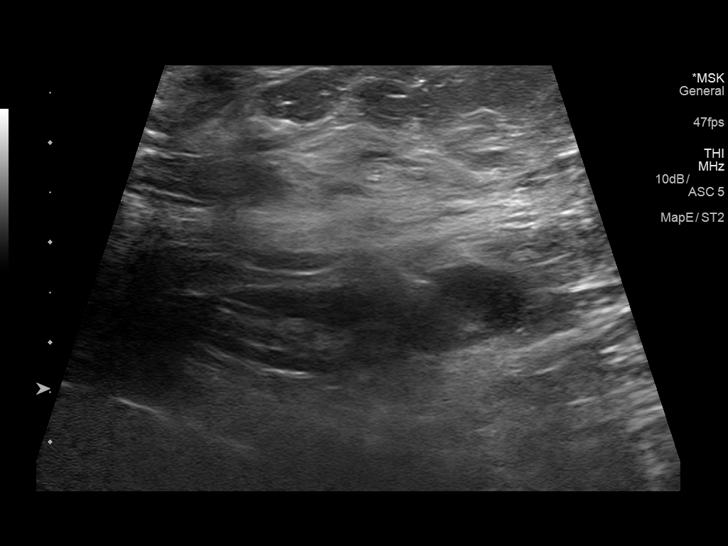
[im 10/17]
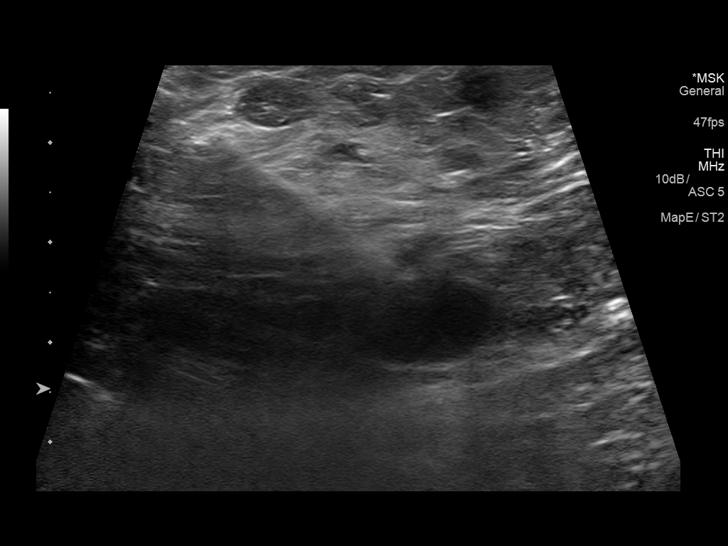
[im 12/17]
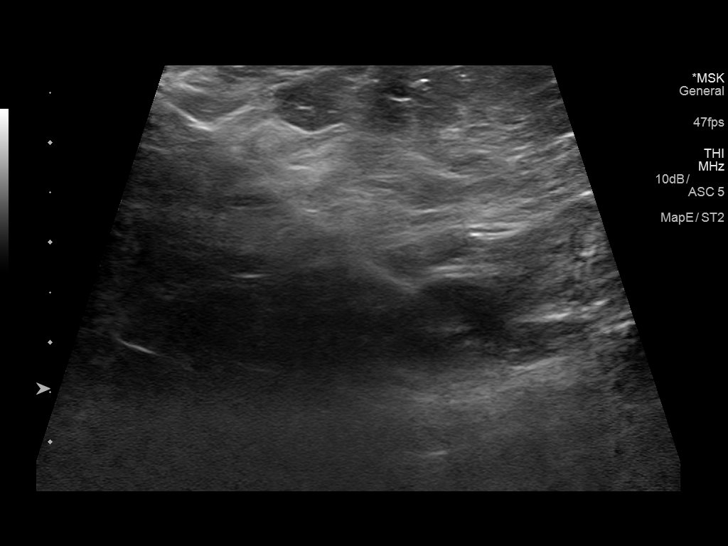
[im 13/17]
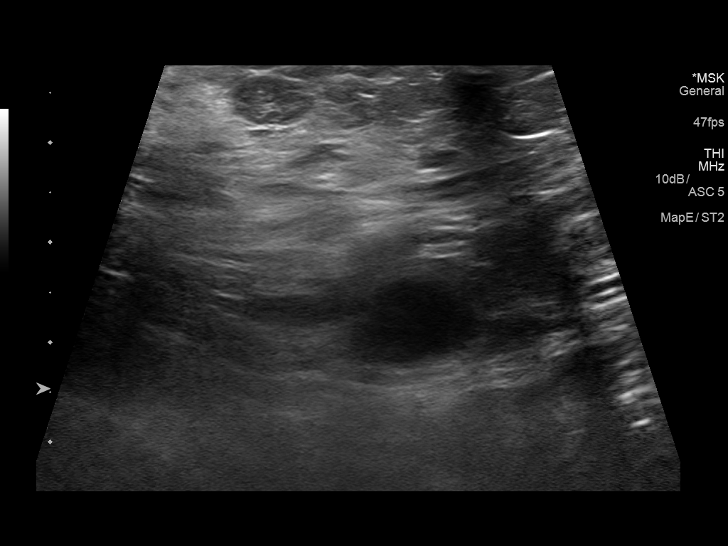
[im 14/17]
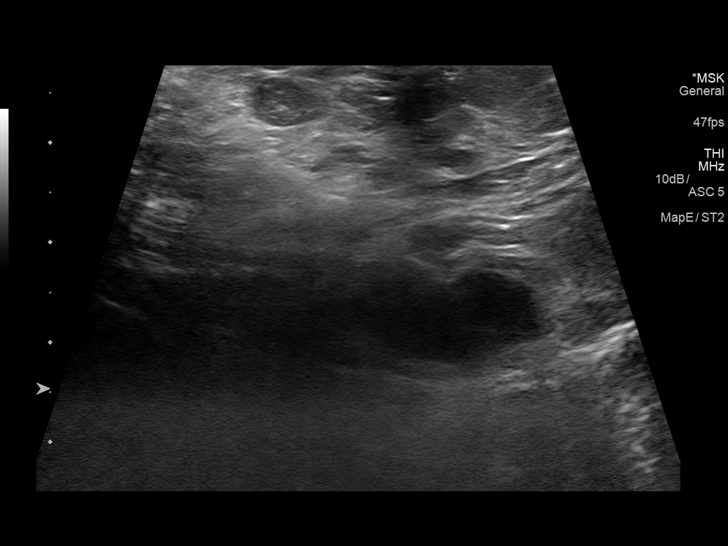
[im 16/17]
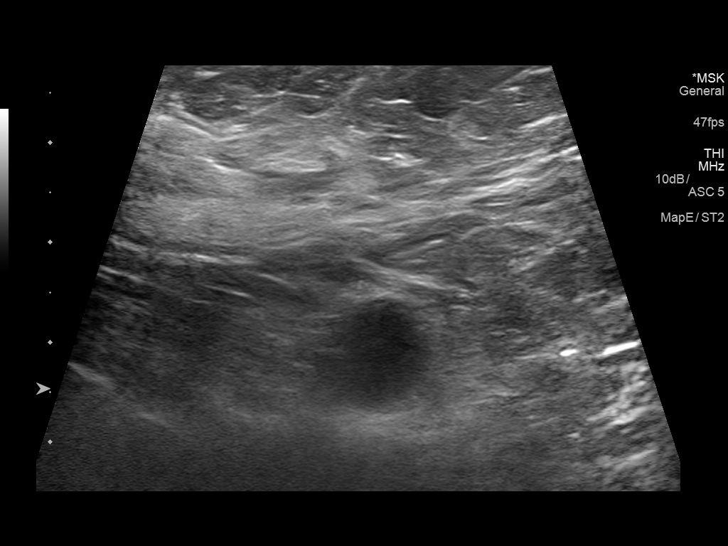
[im 17/17]
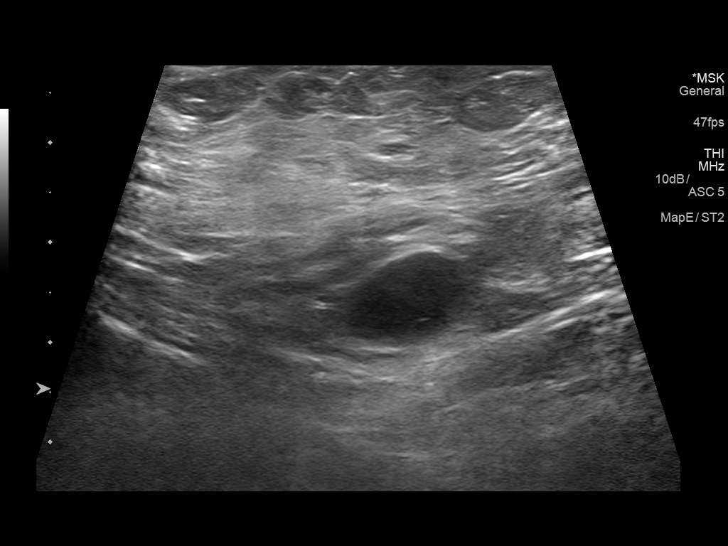

[13 of 17 positions shown; findings below may reference images not displayed]

EXAM:
ULTRASOUND-GUIDED CORE BIOPSY OF LEFT AXILLARY LYMPH NODE.

MEDICATIONS:
None.

ANESTHESIA/SEDATION:
Moderate (conscious) sedation was employed during this procedure. A
total of Versed 2.0 mg and Fentanyl 100 mcg was administered
intravenously.

Moderate Sedation Time: 28 minutes. The patient's level of
consciousness and vital signs were monitored continuously by
radiology nursing throughout the procedure under my direct
supervision.

FLUOROSCOPY TIME:  None

COMPLICATIONS:
None immediate.

PROCEDURE:
The procedure was explained to the patient. The risks and benefits
of the procedure were discussed and the patient's questions were
addressed. Informed consent was obtained from the patient.

The left axillary region and left supraclavicular area were
evaluated with ultrasound. Enlarged lymph node in left
supraclavicular area and left axillary region were identified. Left
axillary lymph node was selected for biopsy.

Left axilla was prepped and draped in sterile fashion. Skin was
anesthetized with 1% lidocaine. Using ultrasound guidance, core
biopsies were obtained with an 18 gauge device. A total of 7 core
biopsies were obtained. Specimens were placed in saline. Bandage
placed over the puncture site.
FINDINGS: There are 2 prominent hypoechoic lymph nodes in left axillary
region. This corresponds with the recent PET-CT imaging. There is a
prominent left supraclavicular lymph node which corresponds with the
recent PET-CT. The largest left axillary lymph was selected for
biopsy because it was felt that core biopsies could be more safely
obtained from this location.
IMPRESSION: Ultrasound-guided core biopsies of a left axillary lymph node.

## 2017-11-20 ENCOUNTER — Telehealth: Payer: Self-pay | Admitting: *Deleted

## 2017-11-20 ENCOUNTER — Encounter (INDEPENDENT_AMBULATORY_CARE_PROVIDER_SITE_OTHER): Payer: Self-pay

## 2017-11-20 NOTE — Telephone Encounter (Signed)
1. "I believe I have the shingles.  Few red bumps under breasts.  larger one the size of a nickel a little lower hurts a little.  Does Dr. Irene Limbo need to see me?  Able to be seen Monday.  I'm in [place] on business, flying to [place] today, returning to [place] 10:00 pm Saturday.  Let me know if I need to return today." *Advised to go to an urgent care there or in [place] 2. "This is the lady in [place].  I sent a message through patient portal.  I'll go Urgent Care in [place] but ned to know if he needs to see me Monday.  Am I contagious?" *Are the red spots blistering?  Medication ordered needs to be completed.  Call with update on status of rash.  If blisters have not crystallized, demonstrating a shingles rash is clearing, Dr. Lindi Adie may need to see you for further evaluation.  Shingles is not contagious but spread by direct contact with fluid from the rash.    3. "Returning missed call from airport.  Leaving in an hour for [place] after which I'm unavailable for a while."  Collaborative nurse reached patient.  Information provided by Triage also provided by collaborative in addition to providing tips to not spread with good hand washing and more.

## 2017-11-21 ENCOUNTER — Telehealth: Payer: Self-pay

## 2017-11-21 NOTE — Telephone Encounter (Signed)
Spoke with patient regarding shingles. Dr. Irene Limbo encouraged pt to go to urgent care wherever she can and start acyclovir as soon as possible. Once pt returns information regarding shingles progression and instructions from the urgent care, will schedule f/u and maintenance acyclovir. Pt to be careful not to spread shingles as it can be extremely contagious during this stage. Pt verbalized understanding of plan. If pt has fever, to stay away from work and other activities.

## 2017-11-23 ENCOUNTER — Encounter (INDEPENDENT_AMBULATORY_CARE_PROVIDER_SITE_OTHER): Payer: Self-pay

## 2017-11-26 ENCOUNTER — Encounter (INDEPENDENT_AMBULATORY_CARE_PROVIDER_SITE_OTHER): Payer: Self-pay

## 2017-12-07 ENCOUNTER — Encounter (INDEPENDENT_AMBULATORY_CARE_PROVIDER_SITE_OTHER): Payer: Self-pay

## 2017-12-08 ENCOUNTER — Encounter (HOSPITAL_COMMUNITY)
Admission: RE | Admit: 2017-12-08 | Discharge: 2017-12-08 | Disposition: A | Discharge status: Home / Self Care | Payer: BLUE CROSS/BLUE SHIELD | Source: Ambulatory Visit | Attending: Hematology | Admitting: Hematology

## 2017-12-08 ENCOUNTER — Other Ambulatory Visit: Payer: Self-pay | Admitting: *Deleted

## 2017-12-08 DIAGNOSIS — C8248 Follicular lymphoma grade IIIb, lymph nodes of multiple sites: Secondary | ICD-10-CM

## 2017-12-08 DIAGNOSIS — Z95828 Presence of other vascular implants and grafts: Secondary | ICD-10-CM

## 2017-12-08 MED ORDER — HEPARIN SOD (PORK) LOCK FLUSH 100 UNIT/ML IV SOLN
500.0000 [IU] | Freq: Once | INTRAVENOUS | Status: AC
  Administered 2017-12-08: 500 [IU] via INTRAVENOUS

## 2017-12-08 MED ORDER — SODIUM CHLORIDE 0.9% FLUSH
10.0000 mL | Freq: Once | INTRAVENOUS | Status: AC
  Administered 2017-12-08: 10 mL via INTRAVENOUS

## 2017-12-08 MED ORDER — HEPARIN SOD (PORK) LOCK FLUSH 100 UNIT/ML IV SOLN
INTRAVENOUS | Status: AC
  Filled 2017-12-08: qty 5

## 2017-12-08 MED ORDER — LIDOCAINE-PRILOCAINE 2.5-2.5 % EX CREA: TOPICAL_CREAM | CUTANEOUS | 0 refills | Status: DC

## 2017-12-28 ENCOUNTER — Emergency Department (HOSPITAL_COMMUNITY)
Admission: EM | Admit: 2017-12-28 | Discharge: 2017-12-28 | Disposition: A | Discharge status: Home / Self Care | Payer: BLUE CROSS/BLUE SHIELD | Attending: Emergency Medicine | Admitting: Emergency Medicine

## 2017-12-28 ENCOUNTER — Encounter (HOSPITAL_COMMUNITY): Payer: Self-pay | Admitting: Emergency Medicine

## 2017-12-28 ENCOUNTER — Emergency Department (HOSPITAL_COMMUNITY): Payer: BLUE CROSS/BLUE SHIELD

## 2017-12-28 ENCOUNTER — Other Ambulatory Visit: Payer: Self-pay

## 2017-12-28 DIAGNOSIS — R6884 Jaw pain: Secondary | ICD-10-CM | POA: Diagnosis present

## 2017-12-28 DIAGNOSIS — C8220 Follicular lymphoma grade III, unspecified, unspecified site: Secondary | ICD-10-CM | POA: Diagnosis not present

## 2017-12-28 DIAGNOSIS — K1121 Acute sialoadenitis: Secondary | ICD-10-CM

## 2017-12-28 HISTORY — DX: Zoster without complications: B02.9

## 2017-12-28 LAB — BASIC METABOLIC PANEL
Anion gap: 13 (ref 5–15)
BUN: 14 mg/dL (ref 6–20)
CHLORIDE: 102 mmol/L (ref 101–111)
CO2: 23 mmol/L (ref 22–32)
Calcium: 9.5 mg/dL (ref 8.9–10.3)
Creatinine, Ser: 0.74 mg/dL (ref 0.44–1.00)
GFR calc Af Amer: >60 mL/min (ref >60)
GFR calc non Af Amer: >60 mL/min (ref >60)
GLUCOSE: 191 mg/dL — AB (ref 65–99)
POTASSIUM: 3.6 mmol/L (ref 3.5–5.1)
SODIUM: 138 mmol/L (ref 135–145)

## 2017-12-28 LAB — CBC WITH DIFFERENTIAL/PLATELET
Basophils Absolute: 0 10*3/uL (ref 0.0–0.1)
Basophils Relative: 1 %
Eosinophils Absolute: 0.1 10*3/uL (ref 0.0–0.7)
Eosinophils Relative: 2 %
HEMATOCRIT: 37.7 % (ref 36.0–46.0)
HEMOGLOBIN: 12.7 g/dL (ref 12.0–15.0)
LYMPHS ABS: 1 10*3/uL (ref 0.7–4.0)
LYMPHS PCT: 17 %
MCH: 30.3 pg (ref 26.0–34.0)
MCHC: 33.7 g/dL (ref 30.0–36.0)
MCV: 90 fL (ref 78.0–100.0)
Monocytes Absolute: 0.5 10*3/uL (ref 0.1–1.0)
Monocytes Relative: 8 %
NEUTROS ABS: 4.3 10*3/uL (ref 1.7–7.7)
NEUTROS PCT: 72 %
Platelets: 204 10*3/uL (ref 150–400)
RBC: 4.19 MIL/uL (ref 3.87–5.11)
RDW: 13.2 % (ref 11.5–15.5)
WBC: 6 10*3/uL (ref 4.0–10.5)

## 2017-12-28 MED ORDER — IOPAMIDOL (ISOVUE-300) INJECTION 61%
75.0000 mL | Freq: Once | INTRAVENOUS | Status: AC | PRN
  Administered 2017-12-28: 75 mL via INTRAVENOUS

## 2017-12-28 MED ORDER — AMOXICILLIN-POT CLAVULANATE 875-125 MG PO TABS: 1.0000 | ORAL_TABLET | Freq: Two times a day (BID) | ORAL | 0 refills | Status: DC

## 2017-12-28 MED ORDER — AMOXICILLIN-POT CLAVULANATE 875-125 MG PO TABS
1.0000 | ORAL_TABLET | Freq: Once | ORAL | Status: AC
  Administered 2017-12-28: 1 via ORAL
  Filled 2017-12-28: qty 1

## 2017-12-28 NOTE — ED Triage Notes (Signed)
Pt states that she has a swollen area on the right jaw it is painful to touch

## 2017-12-28 NOTE — ED Provider Notes (Signed)
[facility] EMERGENCY DEPARTMENT Provider Note   CSN: [id number] Arrival date & time: 12/28/17  6226     History   Chief Complaint Chief Complaint  Patient presents with  . Jaw Pain    HPI Katelyn Reeves is a 67 y.o. female.  Neck pain started 2 days ago Gradual onset Gradually getting worse Now is moderate - Increased pain with palpation No difficulty with opening and closing jaw No fevers, no tooth ache No trauma.  The history is provided by the patient.    The patient is a 67 year old female, who presents with a chief complaint of jaw swelling.  She reports that over the last couple of days she has noticed some increased pain and swelling in the right jaw at the angle of the mandible, in front and below the ear and onto the right upper neck.  She reports this was mild until this morning when she felt like it was more swollen.  It is tender to the touch but not associated with toothache sore throat or difficulty swallowing or breathing.  She has had no changes in her voice, no other infectious symptoms and denies any earache.  She has not had any trauma, she does report a history of lymphoma and has recently undergone CHOP therapy with cyclophosphamide, doxorubicin, vincristine and prednisone.  She is currently finished that therapy and is now taking rituximab.  She is overall doing very well after this recent diagnosis and treatment.  She has not had any difficulty with her symptoms or with her side effects to the medications.  Past Medical History:  Diagnosis Date  . Back pain   . Headache    migraines  . Shingles   . Swollen lymph nodes    left axilla, rectoperitonal     Patient Active Problem List   Diagnosis Date Noted  . Counseling regarding advanced care planning and goals of care 07/04/2017  . Port catheter in place 05/30/2017  . 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 ABDOMINAL HYSTERECTOMY  1980   total abdominal hysterectomy with tubes and ovaries  . BREAST SURGERY Left 1980's   breast biopsy  . CYST EXCISION Left 2017   left scapula  . HEMORRHOID SURGERY  1970's  . IR GENERIC HISTORICAL  12/20/2016   IR FLUORO GUIDE PORT INSERTION RIGHT 12/20/2016 Markus Daft, MD [facility]-INTERV RAD  . IR GENERIC HISTORICAL  12/20/2016   IR US GUIDE VASC ACCESS RIGHT 12/20/2016 Markus Daft, MD [facility]INTER[facility]  . TUBAL LIGATION  1980's   before hysterectomy  . WRIST GANGLION EXCISION Left 1970's     OB History   None      Home Medications    Prior to Admission medications   Medication Sig Start Date End Date Taking? Authorizing Provider  HYDROcodone-acetaminophen (NORCO/VICODIN) 5-325 MG tablet as needed. 06/16/17  Yes [provider]  lidocaine-prilocaine (EMLA) cream Apply to port-a-cath 1 hr prior to treatment/lab draw 12/08/17  Yes Brunetta Genera, MD  amoxicillin-clavulanate (AUGMENTIN) 875-125 MG tablet Take 1 tablet by mouth every 12 (twelve) hours. 12/28/17   Noemi Chapel, MD  prochlorperazine (COMPAZINE) 10 MG tablet Take 1 tablet (10 mg total) by mouth every 6 (six) hours as needed (Nausea or vomiting). 12/19/16 07/04/17  Brunetta Genera, MD    Family History No family history on file.  Social History Social History   Tobacco Use  .  Smoking status: Never Smoker  . Smokeless tobacco: Never Used  Substance Use Topics  . Alcohol use: No  . Drug use: No     Allergies   Sulfa antibiotics   Review of Systems Review of Systems  Constitutional: Negative for fever.  HENT: Positive for facial swelling. Negative for dental problem, rhinorrhea, sore throat, trouble swallowing and voice change.   Eyes: Negative for discharge and redness.  Respiratory: Negative for cough, choking and shortness of breath.   Cardiovascular: Negative for chest pain and leg swelling.  Gastrointestinal: Negative for nausea and vomiting.  Genitourinary:  Negative for dysuria.  Musculoskeletal: Positive for neck pain. Negative for back pain and neck stiffness.  Skin: Negative for rash.  Neurological: Positive for facial asymmetry. Negative for headaches.  Hematological: Positive for adenopathy.     Physical Exam Updated Vital Signs BP (!) 172/85 (BP Location: Left Arm)   Pulse 97   Temp 98.3 F (36.8 C)   Resp 16   Ht 5' 3"  (1.6 m)   Wt 87.5 kg (193 lb)   SpO2 98%   BMI 34.19 kg/m   Physical Exam  Constitutional: She appears well-developed and well-nourished. No distress.  HENT:  Head: Normocephalic and atraumatic.  Mouth/Throat: Oropharynx is clear and moist. No oropharyngeal exudate.  The patient is able to open and close her mouth without any difficulty or pain.  She has slight asymmetry of the jaw with some slight swelling around the parotid gland on the right with some tenderness in this region, there is no obvious palpable lymphadenopathy of the neck on either side however there is some tenderness in the submandibular region on the right.  Her dentition is in good repair, multiple fillings but no signs of periodontal disease, large abscesses or tenderness.  Oropharynx is clear and moist without erythema exudate or asymmetry, mucous membranes are moist.  Nasal passages are clear and tympanic membranes are clear bilaterally.  Eyes: Pupils are equal, round, and reactive to light. Conjunctivae and EOM are normal. Right eye exhibits no discharge. Left eye exhibits no discharge. No scleral icterus.  Neck: Normal range of motion. Neck supple. No JVD present. No thyromegaly present.  The neck is very supple with no significant range of motion difficulties, no thyromegaly, trachea is midline, no significant lymphadenopathy of the neck, there is tenderness in the right upper submandibular region  Cardiovascular: Normal rate, regular rhythm, normal heart sounds and intact distal pulses. Exam reveals no gallop and no friction rub.  No murmur  heard. Pulmonary/Chest: Effort normal and breath sounds normal. No respiratory distress. She has no wheezes. She has no rales.  Abdominal: Soft. Bowel sounds are normal. She exhibits no distension and no mass. There is no tenderness.  Musculoskeletal: Normal range of motion. She exhibits no edema or tenderness.  Lymphadenopathy:    She has no cervical adenopathy.  Neurological: She is alert. Coordination normal.  Skin: Skin is warm and dry. No rash noted. No erythema.  Psychiatric: She has a normal mood and affect. Her behavior is normal.  Nursing note and vitals reviewed.    ED Treatments / Results  Labs (all labs ordered are listed, but only abnormal results are displayed) Labs Reviewed  BASIC METABOLIC PANEL - Abnormal; Notable for the following components:      Result Value   Glucose, Bld 191 (*)    All other components within normal limits  CBC WITH DIFFERENTIAL/PLATELET    EKG None  Radiology Ct Maxillofacial W Contrast  Result  Date: 12/28/2017 CLINICAL DATA:  Soft tissue swelling below right mandible. History of non-Hodgkin lymphoma EXAM: CT MAXILLOFACIAL WITH CONTRAST TECHNIQUE: Multidetector CT imaging of the maxillofacial structures was performed with intravenous contrast. Multiplanar CT image reconstructions were also generated. CONTRAST:  46m ISOVUE-300 IOPAMIDOL (ISOVUE-300) INJECTION 61% COMPARISON:  PET-CT 04/29/2017 FINDINGS: Osseous: Negative for fracture. No bony destruction or bone lesion identified. Negative for acute dental infection. Orbits: Normal orbit.  No mass or edema. Sinuses: Clear Soft tissues: Soft tissues the face negative.  No adenopathy Asymmetric enlargement of the right parotid gland which shows mild hyperenhancement. Findings suggest parotitis. No stone or mass. Negative for adenopathy. Limited intracranial: Negative IMPRESSION: Negative for mass or adenopathy. Mild enlargement and hyperenhancement of the right parotid gland suggesting acute  parotitis. Electronically Signed   By: CFranchot GalloM.D.   On: 12/28/2017 11:13    Procedures Procedures (including critical care time)  Medications Ordered in ED Medications  amoxicillin-clavulanate (AUGMENTIN) 875-125 MG per tablet 1 tablet (has no administration in time range)  iopamidol (ISOVUE-300) 61 % injection 75 mL (75 mLs Intravenous Contrast Given 12/28/17 1044)     Initial Impression / Assessment and Plan / ED Course  I have reviewed the triage vital signs and the nursing notes.  Pertinent labs & imaging results that were available during my care of the patient were reviewed by me and considered in my medical decision making (see chart for details).    The etiology of the patient's symptoms is unclear, this could be proctitis, would also consider lymphadenopathy given her underlying history of lymphoma, TMJ syndrome, does not appear to be dental disease.  Will pursue testing with blood counts given recent chemotherapy to check immune optimize as well as CT scan of the maxillofacial structures to rule out signs of abscess or other disease causing her pain  I have seen the CT - shows parotitis Augmentin Close f/u Could be ductal stone but none seen and no cheeck ttp at the ostia Pt in agreemnt.  Final Clinical Impressions(s) / ED Diagnoses   Final diagnoses:  Acute parotitis    ED Discharge Orders        Ordered    amoxicillin-clavulanate (AUGMENTIN) 875-125 MG tablet  Every 12 hours     12/28/17 1122       MNoemi Chapel MD 12/28/17 1123

## 2017-12-28 NOTE — Discharge Instructions (Signed)
Augmentin twice daily for 7 days ER for worsening swelling / pain This will likely be swollen and tender for several days

## 2017-12-29 ENCOUNTER — Ambulatory Visit: Payer: BLUE CROSS/BLUE SHIELD

## 2017-12-29 ENCOUNTER — Other Ambulatory Visit: Payer: BLUE CROSS/BLUE SHIELD

## 2017-12-29 ENCOUNTER — Ambulatory Visit: Payer: BLUE CROSS/BLUE SHIELD | Admitting: Hematology

## 2017-12-29 NOTE — Progress Notes (Signed)
HEMATOLOGY/ONCOLOGY CLINIC NOTE  Date of Service: 01/01/18  Patient Care Team: Asencion Noble, MD as PCP - General (Internal Medicine)  CHIEF COMPLAINTS/PURPOSE OF CONSULTATION:  F/u for Follicular lymphoma prior to next cycle of Maintenance Rituxan  HISTORY OF PRESENTING ILLNESS:   Katelyn Reeves is a wonderful 67 y.o. female who has been referred to [place] by Dr .Asencion Noble, MD for evaluation and management of suspected non-Hodgkin's lymphoma.  Patient notes that she presented with back pain for 4-6 weeks and eventually had pain radiating to her right inguinal area. She notes that she has had interstitial cystitis in the past. She was thought to have a urinary tract infection and was treated with Macrodantin. Says the pain got worse and started radiating into the right groin she had a CT of the abdomen renal stone protocol on 10/25/2016 which showed extensive retroperitoneal lymphadenopathy concerning for lymphoproliferative disorder. She was incidentally noted to have a nodular border of the liver with possible liver cirrhosis. No urinary stones noted.  She subsequently had a PET CT scan ordered by her primary care physician which was done on 11/07/2016. This showed diffuse hypermetabolic spleen with mild splenomegaly. Hypermetabolic adenopathy in the left supraclavicular, bilateral axillary, peripancreatic, gastrohepatic ligament, celiac, porta hepatis, periaortic, and left external iliac chains. Hypermetabolic mesenteric lymph nodes. Appearance favors lymphoma.  Patient notes some night sweats for 2 weeks. Has lost about 10 pounds in the last month. No acute fevers or chills. Notes the back pain is controlled with when necessary Vicodin.  INTERVAL HISTORY  Katelyn Reeves is here for her scheduled follow-up regarding her follicular lymphoma and third maintenance dose of Rituxan. The patient's last visit with [place] was on 11/03/17. The pt reports that she is doing well overall and has recovered from  shingles well.   In conversation about preventative dose of Acyclovir, the pt reports that she had 6-8 circle sized shingle spots. She notes that she is open and agreeable to beginning prophylactic Acyclovir.   She notes that a week ago she started getting some right jaw swelling and discomfort. She was placed on Augmentin after receiving a CT Maxillofacial, as noted below. She notes that she thinks that her acute parotitis is much better and she has been taking antibiotics for 4 days and denies fever.   Of note since the patient's last visit, pt has had a CT Maxillofacial completed on 12/28/17 with results revealing Negative for mass or adenopathy. Mild enlargement and hyperenhancement of the right parotid gland suggesting acute parotitis.  Lab results today (01/01/18) of CBC, CMP is as follows: all values are WNL. LDH 01/01/18 is WNL at 136.   On review of systems, pt reports resolved shingles, resolved acute parotitis, and denies fevers, abdominal pains, leg swelling, back pain, and any other symptoms.    MEDICAL HISTORY:  #1 fibrocystic disease of the breast #2 hypertension #3 constipation #4 duplicated right sided ureters #5 imaging concern for possible liver cirrhosis  SURGICAL HISTORY:  #1 total abdominal hysterectomy with bilateral salpingo-oophorectomy for ovarian cyst. #2 Lipoma removal left upper back #3 hemorrhoidectomy  SOCIAL HISTORY: Social History   Socioeconomic History  . Marital status: Divorced    Spouse name: Not on file  . Number of children: Not on file  . Years of education: Not on file  . Highest education level: Not on file  Occupational History  . Not on file  Social Needs  . Financial resource strain: Not on file  . Food insecurity:  Worry: Not on file    Inability: Not on file  . Transportation needs:    Medical: Not on file    Non-medical: Not on file  Tobacco Use  . Smoking status: Never Smoker  . Smokeless tobacco: Never Used  Substance  and Sexual Activity  . Alcohol use: No  . Drug use: No  . Sexual activity: Not on file  Lifestyle  . Physical activity:    Days per week: Not on file    Minutes per session: Not on file  . Stress: Not on file  Relationships  . Social connections:    Talks on phone: Not on file    Gets together: Not on file    Attends religious service: Not on file    Active member of club or organization: Not on file    Attends meetings of clubs or organizations: Not on file    Relationship status: Not on file  . Intimate partner violence:    Fear of current or ex partner: Not on file    Emotionally abused: Not on file    Physically abused: Not on file    Forced sexual activity: Not on file  Other Topics Concern  . Not on file  Social History Narrative  . Not on file  Nonsmoker no issues with alcohol use or drug use. 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   FAMILY HISTORY:  Notes her mother had ovarian cancer followed by leukemia and died at age 29 years Maternal aunt gastric cancer  ALLERGIES:  is allergic to sulfa antibiotics.  MEDICATIONS:  Current Outpatient Medications  Medication Sig Dispense Refill  . amoxicillin-clavulanate (AUGMENTIN) 875-125 MG tablet Take 1 tablet by mouth every 12 (twelve) hours. 14 tablet 0  . HYDROcodone-acetaminophen (NORCO/VICODIN) 5-325 MG tablet as needed.  0  . lidocaine-prilocaine (EMLA) cream Apply to port-a-cath 1 hr prior to treatment/lab draw 30 g 0   No current facility-administered medications for this visit.    Facility-Administered Medications Ordered in Other Visits  Medication Dose Route Frequency Provider Last Rate Last Dose  . sodium chloride flush (NS) 0.9 % injection 10 mL  10 mL Intracatheter PRN Brunetta Genera, MD   10 mL at 07/04/17 1540    REVIEW OF SYSTEMS:    .10 Point review of Systems was done is negative except as noted above.  PHYSICAL EXAMINATION: ECOG PERFORMANCE  STATUS: 1 - Symptomatic but completely ambulatory  . Vitals:   01/01/18 0957  BP: 127/82  Pulse: 70  Resp: 18  Temp: 98.7 F (37.1 C)  SpO2: 99%   Filed Weights   01/01/18 0957  Weight: 196 lb 8 oz (89.1 kg)   .Body mass index is 34.81 kg/m.  Marland Kitchen GENERAL:alert, in no acute distress and comfortable SKIN: no acute rashes, no significant lesions EYES: conjunctiva are pink and non-injected, sclera anicteric OROPHARYNX: MMM, no exudates, no oropharyngeal erythema or ulceration NECK: supple, no JVD LYMPH:  no palpable lymphadenopathy in the cervical, axillary or inguinal regions LUNGS: clear to auscultation b/l with normal respiratory effort HEART: regular rate & rhythm ABDOMEN:  normoactive bowel sounds , non tender, not distended. Extremity: no pedal edema PSYCH: alert & oriented x 3 with fluent speech NEURO: no focal motor/sensory deficits    LABORATORY DATA:  I have reviewed the data as listed  . CBC Latest Ref Rng & Units 01/01/2018 12/28/2017 11/03/2017  WBC 3.9 - 10.3 K/uL 5.1 6.0 5.6  Hemoglobin 11.6 -  15.9 g/dL 12.7 12.7 13.0  Hematocrit 34.8 - 46.6 % 37.3 37.7 38.2  Platelets 145 - 400 K/uL 229 204 221   CBC    Component Value Date/Time   WBC 5.1 01/01/2018 0846   WBC 6.0 12/28/2017 0954   RBC 4.12 01/01/2018 0846   HGB 12.7 01/01/2018 0846   HGB 12.6 09/04/2017 1006   HCT 37.3 01/01/2018 0846   HCT 36.6 09/04/2017 1006   PLT 229 01/01/2018 0846   PLT 222 09/04/2017 1006   MCV 90.6 01/01/2018 0846   MCV 88.4 09/04/2017 1006   MCH 30.9 01/01/2018 0846   MCHC 34.1 01/01/2018 0846   RDW 13.7 01/01/2018 0846   RDW 13.1 09/04/2017 1006   LYMPHSABS 0.9 01/01/2018 0846   LYMPHSABS 1.1 09/04/2017 1006   MONOABS 0.4 01/01/2018 0846   MONOABS 0.3 09/04/2017 1006   EOSABS 0.3 01/01/2018 0846   EOSABS 0.2 09/04/2017 1006   BASOSABS 0.0 01/01/2018 0846   BASOSABS 0.0 09/04/2017 1006   . CMP Latest Ref Rng & Units 01/01/2018 12/28/2017 11/03/2017  Glucose 70 -  140 mg/dL 118 191(H) 137  BUN 7 - 26 mg/dL 11 14 12   Creatinine 0.60 - 1.10 mg/dL 0.76 0.74 0.79  Sodium 136 - 145 mmol/L 141 138 140  Potassium 3.5 - 5.1 mmol/L 4.1 3.6 4.2  Chloride 98 - 109 mmol/L 105 102 105  CO2 22 - 29 mmol/L 26 23 26   Calcium 8.4 - 10.4 mg/dL 9.7 9.5 9.9  Total Protein 6.4 - 8.3 g/dL 7.4 - 7.5  Total Bilirubin 0.2 - 1.2 mg/dL 0.4 - 0.4  Alkaline Phos 40 - 150 U/L 72 - 79  AST 5 - 34 U/L 21 - 25  ALT 0 - 55 U/L 20 - 18   . Lab Results  Component Value Date   LDH 136 01/01/2018   Component     Latest Ref Rng & Units 11/12/2016  Hep C Virus Ab     0.0 - 0.9 s/co ratio <0.1  Hepatitis B Surface Ag     Negative Negative  Hep B Core Ab, Tot     Negative Negative       RADIOGRAPHIC STUDIES: I have personally reviewed the radiological images as listed and agreed with the findings in the report. Ct Maxillofacial W Contrast  Result Date: 12/28/2017 CLINICAL DATA:  Soft tissue swelling below right mandible. History of non-Hodgkin lymphoma EXAM: CT MAXILLOFACIAL WITH CONTRAST TECHNIQUE: Multidetector CT imaging of the maxillofacial structures was performed with intravenous contrast. Multiplanar CT image reconstructions were also generated. CONTRAST:  28m ISOVUE-300 IOPAMIDOL (ISOVUE-300) INJECTION 61% COMPARISON:  PET-CT 04/29/2017 FINDINGS: Osseous: Negative for fracture. No bony destruction or bone lesion identified. Negative for acute dental infection. Orbits: Normal orbit.  No mass or edema. Sinuses: Clear Soft tissues: Soft tissues the face negative.  No adenopathy Asymmetric enlargement of the right parotid gland which shows mild hyperenhancement. Findings suggest parotitis. No stone or mass. Negative for adenopathy. Limited intracranial: Negative IMPRESSION: Negative for mass or adenopathy. Mild enlargement and hyperenhancement of the right parotid gland suggesting acute parotitis. Electronically Signed   By: CFranchot GalloM.D.   On: 12/28/2017 11:13     ASSESSMENT & PLAN:   67y.o. 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  Presented with Generalized FDG avid lymphadenopathy - predominantly in the abdomen/Retroperitoneum.  Also noted to have left supraclavicular and bilateral axillary left more than right FDG avid lymphadenopathy. Based on imaging this would represent at least Stage III disease if this were a lymphoma. LDH level is not significantly elevated. Blood counts are stable. PET/CT scan does show fairly active disease which is FDG avid LNadenopathy and FDG avid splenomegaly. Patient has some constitutional symptoms with about a 10 pound weight loss and some night sweats. Hepatitis profile negative. No other obvious new focal symptoms.  Patient has had an ECHO which shows nl EF  Patient is status post 6 cycles of R CHOP with no prohibitive toxicities.  PET/CT on 8/72018 - showed complete metabolic response with no metabolically active disease. A few borderline lymph nodes in the retroperitoneum.  CT chest/abd/pelvis 11/04/2017:  No new or progressive findings to suggest recurrent disease. The small abdominal retroperitoneal lymph nodes are unchanged when comparing to PET-CT of 04/29/2017.   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 Improved with massage therapy  #4 Recent resolved mild shingles  #5 Recent resolved parotitis Plan -she has completed both her pneumococcal vaccination as was previously discussed. -I called her on the phone after her CT on 11/04/2017 and reviewed the results in details with her . -Discussed pt labwork today 01/01/18; blood counts and chemistries are all WNL and stable.  -Discussed preventative dose of Acyclovir vs early re-treatment and she prefers the later approach.  -will  prescribe Valtrex to have available if there is a recurrence of shingles (esp since she travels a lot for her job) -Discussed again that she is free to have her port-a-cath removed; she notes that she will likely want to do this soon, but not today.  -Discussed that with maintenance Rituxan, we will need to keep an eye out for infections.  -No clinical or lab evidence of lymphoma return or progression at this time.  -No prohibitive toxicities from maintenance Rituxan at this time.  Please schedule for next 2 doses of maintenance Rituxan q60days RTC with Dr Irene Limbo with labs with each maintain dose of Rituxan q60days Port flush appointment with each treatment   All of the patients questions were answered with apparent satisfaction. The patient knows to call the clinic with any problems, questions or concerns.  . The total time spent in the appointment was 25 minutes and more than 50% was on counseling and direct patient cares.      Sullivan Lone MD [place] AAHIVMS [facility] Hematology/Oncology Physician [place]  (Office):       [phone] (Work cell):  [phone] (Fax):           [phone]  This document serves as a record of services personally performed by Sullivan Lone, MD. It was created on his behalf by Baldwin Jamaica, a trained medical scribe. The creation of this record is based on the scribe's personal observations and the provider's statements to them.   .I have reviewed the above documentation for accuracy and completeness, and I agree with the above. Brunetta Genera MD [place]

## 2017-12-31 ENCOUNTER — Encounter: Payer: Self-pay | Admitting: Hematology

## 2018-01-01 ENCOUNTER — Inpatient Hospital Stay: Payer: BLUE CROSS/BLUE SHIELD

## 2018-01-01 ENCOUNTER — Inpatient Hospital Stay (HOSPITAL_BASED_OUTPATIENT_CLINIC_OR_DEPARTMENT_OTHER): Payer: BLUE CROSS/BLUE SHIELD | Admitting: Hematology

## 2018-01-01 ENCOUNTER — Inpatient Hospital Stay: Payer: BLUE CROSS/BLUE SHIELD | Attending: Hematology

## 2018-01-01 ENCOUNTER — Encounter: Payer: Self-pay | Admitting: Hematology

## 2018-01-01 VITALS — BP 127/82 | HR 70 | Temp 98.7°F | Resp 18 | Ht 63.0 in | Wt 196.5 lb

## 2018-01-01 VITALS — BP 135/81 | HR 70 | Temp 97.9°F | Resp 20

## 2018-01-01 DIAGNOSIS — Z5112 Encounter for antineoplastic immunotherapy: Secondary | ICD-10-CM | POA: Diagnosis present

## 2018-01-01 DIAGNOSIS — C8248 Follicular lymphoma grade IIIb, lymph nodes of multiple sites: Secondary | ICD-10-CM | POA: Insufficient documentation

## 2018-01-01 DIAGNOSIS — Z7189 Other specified counseling: Secondary | ICD-10-CM

## 2018-01-01 DIAGNOSIS — I10 Essential (primary) hypertension: Secondary | ICD-10-CM

## 2018-01-01 DIAGNOSIS — Z95828 Presence of other vascular implants and grafts: Secondary | ICD-10-CM

## 2018-01-01 DIAGNOSIS — M25511 Pain in right shoulder: Secondary | ICD-10-CM | POA: Diagnosis not present

## 2018-01-01 DIAGNOSIS — C8580 Other specified types of non-Hodgkin lymphoma, unspecified site: Secondary | ICD-10-CM

## 2018-01-01 LAB — CBC WITH DIFFERENTIAL (CANCER CENTER ONLY)
Basophils Absolute: 0 10*3/uL (ref 0.0–0.1)
Basophils Relative: 0 %
Eosinophils Absolute: 0.3 10*3/uL (ref 0.0–0.5)
Eosinophils Relative: 6 %
HCT: 37.3 % (ref 34.8–46.6)
HEMOGLOBIN: 12.7 g/dL (ref 11.6–15.9)
LYMPHS ABS: 0.9 10*3/uL (ref 0.9–3.3)
LYMPHS PCT: 17 %
MCH: 30.9 pg (ref 25.1–34.0)
MCHC: 34.1 g/dL (ref 31.5–36.0)
MCV: 90.6 fL (ref 79.5–101.0)
Monocytes Absolute: 0.4 10*3/uL (ref 0.1–0.9)
Monocytes Relative: 7 %
NEUTROS PCT: 70 %
Neutro Abs: 3.6 10*3/uL (ref 1.5–6.5)
Platelet Count: 229 10*3/uL (ref 145–400)
RBC: 4.12 MIL/uL (ref 3.70–5.45)
RDW: 13.7 % (ref 11.2–14.5)
WBC: 5.1 10*3/uL (ref 3.9–10.3)

## 2018-01-01 LAB — CMP (CANCER CENTER ONLY)
ALT: 20 U/L (ref 0–55)
AST: 21 U/L (ref 5–34)
Albumin: 4 g/dL (ref 3.5–5.0)
Alkaline Phosphatase: 72 U/L (ref 40–150)
Anion gap: 10 (ref 3–11)
BUN: 11 mg/dL (ref 7–26)
CHLORIDE: 105 mmol/L (ref 98–109)
CO2: 26 mmol/L (ref 22–29)
Calcium: 9.7 mg/dL (ref 8.4–10.4)
Creatinine: 0.76 mg/dL (ref 0.60–1.10)
Glucose, Bld: 118 mg/dL (ref 70–140)
POTASSIUM: 4.1 mmol/L (ref 3.5–5.1)
Sodium: 141 mmol/L (ref 136–145)
Total Bilirubin: 0.4 mg/dL (ref 0.2–1.2)
Total Protein: 7.4 g/dL (ref 6.4–8.3)

## 2018-01-01 LAB — LACTATE DEHYDROGENASE: LDH: 136 U/L (ref 125–245)

## 2018-01-01 MED ORDER — ALTEPLASE 2 MG IJ SOLR
2.0000 mg | Freq: Once | INTRAMUSCULAR | Status: AC
  Administered 2018-01-01: 2 mg
  Filled 2018-01-01: qty 2

## 2018-01-01 MED ORDER — ACETAMINOPHEN 325 MG PO TABS
650.0000 mg | ORAL_TABLET | Freq: Once | ORAL | Status: AC
  Administered 2018-01-01: 650 mg via ORAL

## 2018-01-01 MED ORDER — DIPHENHYDRAMINE HCL 25 MG PO CAPS
ORAL_CAPSULE | ORAL | Status: AC
  Filled 2018-01-01: qty 1

## 2018-01-01 MED ORDER — DIPHENHYDRAMINE HCL 25 MG PO CAPS
50.0000 mg | ORAL_CAPSULE | Freq: Once | ORAL | Status: AC
  Administered 2018-01-01: 50 mg via ORAL

## 2018-01-01 MED ORDER — SODIUM CHLORIDE 0.9 % IV SOLN
375.0000 mg/m2 | Freq: Once | INTRAVENOUS | Status: AC
  Administered 2018-01-01: 700 mg via INTRAVENOUS
  Filled 2018-01-01: qty 20

## 2018-01-01 MED ORDER — HEPARIN SOD (PORK) LOCK FLUSH 100 UNIT/ML IV SOLN
500.0000 [IU] | Freq: Once | INTRAVENOUS | Status: AC | PRN
  Administered 2018-01-01: 500 [IU]
  Filled 2018-01-01: qty 5

## 2018-01-01 MED ORDER — SODIUM CHLORIDE 0.9% FLUSH
10.0000 mL | Freq: Once | INTRAVENOUS | Status: AC
  Administered 2018-01-01: 10 mL
  Filled 2018-01-01: qty 10

## 2018-01-01 MED ORDER — ACETAMINOPHEN 325 MG PO TABS
ORAL_TABLET | ORAL | Status: AC
  Filled 2018-01-01: qty 2

## 2018-01-01 MED ORDER — SODIUM CHLORIDE 0.9% FLUSH
10.0000 mL | INTRAVENOUS | Status: DC | PRN
  Administered 2018-01-01: 10 mL
  Filled 2018-01-01: qty 10

## 2018-01-01 MED ORDER — SODIUM CHLORIDE 0.9 % IV SOLN
Freq: Once | INTRAVENOUS | Status: AC
  Administered 2018-01-01: 11:00:00 via INTRAVENOUS

## 2018-01-01 MED ORDER — ALTEPLASE 2 MG IJ SOLR
INTRAMUSCULAR | Status: AC
  Filled 2018-01-01: qty 2

## 2018-01-01 MED ORDER — VALACYCLOVIR HCL 1 G PO TABS: 1000.0000 mg | ORAL_TABLET | Freq: Two times a day (BID) | ORAL | 0 refills | Status: DC

## 2018-01-01 NOTE — Progress Notes (Signed)
Port flushed several times with repositioning with no blood return. Desk nurse for Dr. Irene Limbo called and CATHFLOW will be administered. Labs are being drawn by LAB ONLY. Porsche Cates LPN

## 2018-01-01 NOTE — Patient Instructions (Signed)
[facility] Discharge Instructions for Patients Receiving Chemotherapy  Today you received the following chemotherapy agents:  Rituxan.  To help prevent nausea and vomiting after your treatment, we encourage you to take your nausea medication as directed.   If you develop nausea and vomiting that is not controlled by your nausea medication, call the clinic.   BELOW ARE SYMPTOMS THAT SHOULD BE REPORTED IMMEDIATELY:  *FEVER GREATER THAN 100.5 F  *CHILLS WITH OR WITHOUT FEVER  NAUSEA AND VOMITING THAT IS NOT CONTROLLED WITH YOUR NAUSEA MEDICATION  *UNUSUAL SHORTNESS OF BREATH  *UNUSUAL BRUISING OR BLEEDING  TENDERNESS IN MOUTH AND THROAT WITH OR WITHOUT PRESENCE OF ULCERS  *URINARY PROBLEMS  *BOWEL PROBLEMS  UNUSUAL RASH Items with * indicate a potential emergency and should be followed up as soon as possible.  Feel free to call the clinic should you have any questions or concerns. The clinic phone number is [phone].  Please show the [facility] at check-in to the Emergency Department and triage nurse.

## 2018-01-01 NOTE — Patient Instructions (Signed)
Thank you for choosing [facility] to provide your oncology and hematology care.  To afford each patient quality time with our providers, please arrive 30 minutes before your scheduled appointment time.  If you arrive late for your appointment, you may be asked to reschedule.  We strive to give you quality time with our providers, and arriving late affects you and other patients whose appointments are after yours.   If you are a no show for multiple scheduled visits, you may be dismissed from the clinic at the providers discretion.    Again, thank you for choosing [facility], our hope is that these requests will decrease the amount of time that you wait before being seen by our physicians.  ______________________________________________________________________  Should you have questions after your visit to the [facility], please contact our office at [phone] between the hours of 8:30 and 4:30 p.m.    Voicemails left after 4:30p.m will not be returned until the following business day.    For prescription refill requests, please have your pharmacy contact us directly.  Please also try to allow 48 hours for prescription requests.    Please contact the scheduling department for questions regarding scheduling.  For scheduling of procedures such as PET scans, CT scans, MRI, Ultrasound, etc please contact central scheduling at [phone].    Resources For Cancer Patients and Caregivers:   Oncolink.org:  A wonderful resource for patients and healthcare providers for information regarding your disease, ways to tract your treatment, what to expect, etc.     American Cancer Society:  800-227-2345  Can help patients locate various types of support and financial assistance  Cancer Care: 1-800-813-HOPE (4673) Provides financial assistance, online support groups, medication/co-pay assistance.    [place] DSS:  336-641-3447 Where to apply for food  stamps, Medicaid, and utility assistance  Medicare Rights Center: 800-333-4114 Helps people with Medicare understand their rights and benefits, navigate the Medicare system, and secure the quality healthcare they deserve  SCAT: 336-333-6589 [place] [employer]'s shared-ride transportation service for eligible riders who have a disability that prevents them from riding the fixed route bus.    For additional information on assistance programs please contact our social worker:   Grier Hock/Abigail Elmore:  [phone]            

## 2018-01-05 ENCOUNTER — Telehealth: Payer: Self-pay | Admitting: Hematology

## 2018-01-05 NOTE — Telephone Encounter (Signed)
Returned phone call  - pt wanted to discuss flush appointment. Covered all details and confirmed 6/7 appts.

## 2018-01-07 ENCOUNTER — Encounter: Payer: Self-pay | Admitting: Hematology

## 2018-01-08 ENCOUNTER — Encounter: Payer: Self-pay | Admitting: Hematology

## 2018-01-08 NOTE — Progress Notes (Signed)
Patient called wanting information about Medicare coverages and options. Advised patient that she may want to contact Medicare to find out which coverage may work best for her. There are a variety of different Medicare coverages, replacement plans and supplement plans and we see them all. She wanted to know what she can google to find about plans. Advised her to research supplement plans if she decides to go on A&B and would like additional coverage to pick up the copay that Medicare doesn't cover.

## 2018-01-18 ENCOUNTER — Encounter: Payer: Self-pay | Admitting: Hematology

## 2018-01-23 ENCOUNTER — Telehealth: Payer: Self-pay

## 2018-01-23 NOTE — Telephone Encounter (Signed)
Pt sent message via MyChart requesting to know when to have routine dental cleaning and when to take antibiotics, as well as, who would be the one prescribing the antibiotic. Responded to the message that per Dr. Irene Limbo, it is okay to proceed with routine cleaning. The dentist would need to prescribe antibiotic and provide the patient with instruction on when to take the medication.

## 2018-02-05 ENCOUNTER — Other Ambulatory Visit: Payer: Self-pay | Admitting: Medical Oncology

## 2018-02-06 ENCOUNTER — Telehealth: Payer: Self-pay

## 2018-02-06 ENCOUNTER — Other Ambulatory Visit: Payer: Self-pay

## 2018-02-06 ENCOUNTER — Encounter: Payer: Self-pay | Admitting: Hematology

## 2018-02-06 ENCOUNTER — Encounter (HOSPITAL_COMMUNITY)
Admission: RE | Admit: 2018-02-06 | Discharge: 2018-02-06 | Disposition: A | Discharge status: Home / Self Care | Payer: BLUE CROSS/BLUE SHIELD | Source: Ambulatory Visit | Attending: Hematology | Admitting: Hematology

## 2018-02-06 DIAGNOSIS — Z95828 Presence of other vascular implants and grafts: Secondary | ICD-10-CM

## 2018-02-06 DIAGNOSIS — C8248 Follicular lymphoma grade IIIb, lymph nodes of multiple sites: Secondary | ICD-10-CM | POA: Diagnosis not present

## 2018-02-06 MED ORDER — HEPARIN SOD (PORK) LOCK FLUSH 100 UNIT/ML IV SOLN
INTRAVENOUS | Status: AC
  Filled 2018-02-06: qty 5

## 2018-02-06 MED ORDER — SODIUM CHLORIDE 0.9% FLUSH: 10.0000 mL | INTRAVENOUS | Status: DC

## 2018-02-06 MED ORDER — HEPARIN SOD (PORK) LOCK FLUSH 10 UNIT/ML IV SOLN
10.0000 [IU] | INTRAVENOUS | Status: DC
  Filled 2018-02-06: qty 1

## 2018-02-06 MED ORDER — HEPARIN SOD (PORK) LOCK FLUSH 100 UNIT/ML IV SOLN
500.0000 [IU] | INTRAVENOUS | Status: AC | PRN
  Administered 2018-02-06: 500 [IU]

## 2018-02-06 MED ORDER — SODIUM CHLORIDE 0.9% FLUSH
10.0000 mL | INTRAVENOUS | Status: AC | PRN
  Administered 2018-02-06: 10 mL

## 2018-02-06 NOTE — Progress Notes (Signed)
Patient called and left voicemail regarding obtaining a copy of all her charges from last year and where to get them. Called patient to give her the number to patient accounting at [phone] but her voicemail was full.

## 2018-02-06 NOTE — Telephone Encounter (Signed)
Request from [facility] for pt port flush orders to be faxed. Diagnosis code provided along with frequency requested of every 6 weeks until removal of port. Fax sent to [phone]. Confirmed fax receipt 02/06/18 at 1335.  Pt request for documentation to be sent to Dr. Abigail Miyamoto, dentist in [place], to confirm approval from Dr. Irene Limbo for routine cleaning to be performed, and that it is the dentist's responsibility to provide the pt with antibiotics and administration instruction. Letter faxed to [phone]. Confirmed fax receipt 02/06/18 at 1400.

## 2018-02-26 NOTE — Progress Notes (Signed)
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 female who has been referred to [place] by Dr .Asencion Noble, MD for evaluation and management of suspected non-Hodgkin's lymphoma.  Patient notes that she presented with back pain for 4-6 weeks and eventually had pain radiating to her right inguinal area. She notes that she has had interstitial cystitis in the past. She was thought to have a urinary tract infection and was treated with Macrodantin. Says the pain got worse and started radiating into the right groin she had a CT of the abdomen renal stone protocol on 10/25/2016 which showed extensive retroperitoneal lymphadenopathy concerning for lymphoproliferative disorder. She was incidentally noted to have a nodular border of the liver with possible liver cirrhosis. No urinary stones noted.  She subsequently had a PET CT scan ordered by her primary care physician which was done on 11/07/2016. This showed diffuse hypermetabolic spleen with mild splenomegaly. Hypermetabolic adenopathy in the left supraclavicular, bilateral axillary, peripancreatic, gastrohepatic ligament, celiac, porta hepatis, periaortic, and left external iliac chains. Hypermetabolic mesenteric lymph nodes. Appearance favors lymphoma.  Patient notes some night sweats for 2 weeks. Has lost about 10 pounds in the last month. No acute fevers or chills. Notes the back pain is controlled with when necessary Vicodin.  INTERVAL HISTORY  Ms Glasser is here for her scheduled follow-up regarding her follicular lymphoma and third maintenance dose of Rituxan. The patient's last visit with [place] was on 01/01/18. The pt reports that she is doing well overall.   The pt reports  continuing itchiness after her resolved shingles. She took 26m Benadryl last night and isn't sure if this has helped yet. She notes that her itchiness isn't usually at one specific spot.   Lab results today (02/27/18) of CBC, CMP, and Reticulocytes is as follows: all values are WNL. LDH 02/27/18 is WNL at 143.   On review of systems, pt reports itchy skin, stable neuropathy in her toes, good energy levels, and denies fevers, chills, infections, night sweats, abdominal pains, unexpected weight loss, and any other symptoms.   MEDICAL HISTORY:  #1 fibrocystic disease of the breast #2 hypertension #3 constipation #4 duplicated right sided ureters #5 imaging concern for possible liver cirrhosis  SURGICAL HISTORY:  #1 total abdominal hysterectomy with bilateral salpingo-oophorectomy for ovarian cyst. #2 Lipoma removal left upper back #3 hemorrhoidectomy  SOCIAL HISTORY: Social History   Socioeconomic History  . Marital status: Divorced    Spouse name: Not on file  . Number of children: Not on file  . Years of education: Not on file  . Highest education level: Not on file  Occupational History  . Not on file  Social Needs  . Financial resource strain: Not on file  . Food insecurity:    Worry: Not on file    Inability: Not on file  . Transportation needs:    Medical: Not on file    Non-medical: Not on file  Tobacco Use  . Smoking status: Never Smoker  . Smokeless tobacco: Never Used  Substance and Sexual Activity  . Alcohol use: No  . Drug use: No  . Sexual activity: Not on file  Lifestyle  . Physical activity:    Days per week: Not on file  Minutes per session: Not on file  . Stress: Not on file  Relationships  . Social connections:    Talks on phone: Not on file    Gets together: Not on file    Attends religious service: Not on file    Active member of club or organization: Not on file    Attends meetings of clubs or organizations: Not on file    Relationship  status: Not on file  . Intimate partner violence:    Fear of current or ex partner: Not on file    Emotionally abused: Not on file    Physically abused: Not on file    Forced sexual activity: Not on file  Other Topics Concern  . Not on file  Social History Narrative  . Not on file  Nonsmoker no issues with alcohol use or drug use. 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   FAMILY HISTORY:  Notes her mother had ovarian cancer followed by leukemia and died at age 107 years Maternal aunt gastric cancer  ALLERGIES:  is allergic to sulfa antibiotics.  MEDICATIONS:  Current Outpatient Medications  Medication Sig Dispense Refill  . amoxicillin-clavulanate (AUGMENTIN) 875-125 MG tablet Take 1 tablet by mouth every 12 (twelve) hours. 14 tablet 0  . HYDROcodone-acetaminophen (NORCO/VICODIN) 5-325 MG tablet as needed.  0  . lidocaine-prilocaine (EMLA) cream Apply to port-a-cath 1 hr prior to treatment/lab draw 30 g 0  . valACYclovir (VALTREX) 1000 MG tablet Take 1 tablet (1,000 mg total) by mouth 2 (two) times daily. For shingles outbreak 14 tablet 0   No current facility-administered medications for this visit.    Facility-Administered Medications Ordered in Other Visits  Medication Dose Route Frequency Provider Last Rate Last Dose  . 0.9 %  sodium chloride infusion   Intravenous Once Brunetta Genera, MD      . acetaminophen (TYLENOL) tablet 650 mg  650 mg Oral Once Brunetta Genera, MD      . diphenhydrAMINE (BENADRYL) capsule 50 mg  50 mg Oral Once Brunetta Genera, MD      . heparin lock flush 100 unit/mL  500 Units Intracatheter Once PRN Brunetta Genera, MD      . riTUXimab (RITUXAN) 700 mg in sodium chloride 0.9 % 180 mL infusion  375 mg/m2 (Treatment Plan Recorded) Intravenous Once Brunetta Genera, MD      . sodium chloride flush (NS) 0.9 % injection 10 mL  10 mL Intracatheter PRN Brunetta Genera, MD   10 mL at  07/04/17 1540  . sodium chloride flush (NS) 0.9 % injection 10 mL  10 mL Intracatheter PRN Brunetta Genera, MD        REVIEW OF SYSTEMS:    A 10+ POINT REVIEW OF SYSTEMS WAS OBTAINED including neurology, dermatology, psychiatry, cardiac, respiratory, lymph, extremities, GI, GU, Musculoskeletal, constitutional, breasts, reproductive, HEENT.  All pertinent positives are noted in the HPI.  All others are negative.   PHYSICAL EXAMINATION: ECOG PERFORMANCE STATUS: 1 - Symptomatic but completely ambulatory  . Vitals:   02/27/18 1106  BP: 140/70  Pulse: 61  Resp: 18  Temp: 98.1 F (36.7 C)  SpO2: 100%   Filed Weights   02/27/18 1106  Weight: 199 lb 9.6 oz (90.5 kg)   .Body mass index is 35.36 kg/m.  GENERAL:alert, in no acute distress and comfortable SKIN: no acute rashes, no significant lesions EYES: conjunctiva are pink and non-injected, sclera anicteric OROPHARYNX: MMM, no exudates,  no oropharyngeal erythema or ulceration NECK: supple, no JVD LYMPH:  no palpable lymphadenopathy in the cervical, axillary or inguinal regions LUNGS: clear to auscultation b/l with normal respiratory effort HEART: regular rate & rhythm ABDOMEN:  normoactive bowel sounds , non tender, not distended. Extremity: no pedal edema PSYCH: alert & oriented x 3 with fluent speech NEURO: no focal motor/sensory deficits   LABORATORY DATA:  I have reviewed the data as listed  . CBC Latest Ref Rng & Units 02/27/2018 01/01/2018 12/28/2017  WBC 3.9 - 10.3 K/uL 5.3 5.1 6.0  Hemoglobin 11.6 - 15.9 g/dL 12.1 12.7 12.7  Hematocrit 34.8 - 46.6 % 35.3 37.3 37.7  Platelets 145 - 400 K/uL 203 229 204   CBC    Component Value Date/Time   WBC 5.3 02/27/2018 0852   WBC 6.0 12/28/2017 0954   RBC 3.90 02/27/2018 0852   RBC 3.90 02/27/2018 0852   HGB 12.1 02/27/2018 0852   HGB 12.6 09/04/2017 1006   HCT 35.3 02/27/2018 0852   HCT 36.6 09/04/2017 1006   PLT 203 02/27/2018 0852   PLT 222 09/04/2017 1006   MCV  90.5 02/27/2018 0852   MCV 88.4 09/04/2017 1006   MCH 31.0 02/27/2018 0852   MCHC 34.3 02/27/2018 0852   RDW 12.9 02/27/2018 0852   RDW 13.1 09/04/2017 1006   LYMPHSABS 1.0 02/27/2018 0852   LYMPHSABS 1.1 09/04/2017 1006   MONOABS 0.5 02/27/2018 0852   MONOABS 0.3 09/04/2017 1006   EOSABS 0.3 02/27/2018 0852   EOSABS 0.2 09/04/2017 1006   BASOSABS 0.0 02/27/2018 0852   BASOSABS 0.0 09/04/2017 1006   . CMP Latest Ref Rng & Units 02/27/2018 01/01/2018 12/28/2017  Glucose 70 - 140 mg/dL 138 118 191(H)  BUN 7 - 26 mg/dL 11 11 14   Creatinine 0.60 - 1.10 mg/dL 0.79 0.76 0.74  Sodium 136 - 145 mmol/L 140 141 138  Potassium 3.5 - 5.1 mmol/L 4.0 4.1 3.6  Chloride 98 - 109 mmol/L 105 105 102  CO2 22 - 29 mmol/L 24 26 23   Calcium 8.4 - 10.4 mg/dL 9.7 9.7 9.5  Total Protein 6.4 - 8.3 g/dL 7.2 7.4 -  Total Bilirubin 0.2 - 1.2 mg/dL 0.4 0.4 -  Alkaline Phos 40 - 150 U/L 77 72 -  AST 5 - 34 U/L 20 21 -  ALT 0 - 55 U/L 13 20 -   . Lab Results  Component Value Date   LDH 143 02/27/2018   Component     Latest Ref Rng & Units 11/12/2016  Hep C Virus Ab     0.0 - 0.9 s/co ratio <0.1  Hepatitis B Surface Ag     Negative Negative  Hep B Core Ab, Tot     Negative Negative       RADIOGRAPHIC STUDIES: I have personally reviewed the radiological images as listed and agreed with the findings in the report. No results found.  ASSESSMENT & PLAN:   68 y.o. 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  Presented with Generalized FDG avid lymphadenopathy - predominantly in the abdomen/Retroperitoneum. Also noted to have left supraclavicular and bilateral axillary left more than right FDG avid lymphadenopathy. Based on imaging this would represent at least Stage III disease if this were a lymphoma. LDH level is not significantly elevated. Blood counts are  stable. PET/CT scan does  show fairly active disease which is FDG avid LNadenopathy and FDG avid splenomegaly. Patient has some constitutional symptoms with about a 10 pound weight loss and some night sweats. Hepatitis profile negative. No other obvious new focal symptoms.  Patient has had an ECHO which shows nl EF  Patient is status post 6 cycles of R CHOP with no prohibitive toxicities.  PET/CT on 8/72018 - showed complete metabolic response with no metabolically active disease. A few borderline lymph nodes in the retroperitoneum.  CT chest/abd/pelvis 11/04/2017:  No new or progressive findings to suggest recurrent disease. The small abdominal retroperitoneal lymph nodes are unchanged when comparing to PET-CT of 04/29/2017.   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 Improved with massage therapy  #4 Recent resolved mild shingles  #5 Recent resolved parotitis Plan -Discussed pt labwork today, 02/27/18; blood counts, chemistries and LDH are all normal.  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  -Will continue maintenance Rituxan  -continue maintenance Rituxan q60 days with labs -- plz schedule next 3 doses -RTC with Dr Irene Limbo with each maintenance Rituxan infusion.   All of the patients questions were answered with apparent satisfaction. The patient knows to call the clinic with any problems, questions or concerns.  . The total time spent in the appointment was 20 minutes and more than 50% was on counseling and direct patient cares.   Sullivan Lone MD [place] AAHIVMS [facility] Hematology/Oncology Physician [facility]  (Office):        [phone] (Work cell):  [phone] (Fax):           [phone]  I, Baldwin Jamaica, am acting as a scribe for Dr Irene Limbo.   .I have reviewed the above documentation for accuracy and completeness, and I agree with the above. Brunetta Genera MD

## 2018-02-27 ENCOUNTER — Inpatient Hospital Stay: Payer: BLUE CROSS/BLUE SHIELD

## 2018-02-27 ENCOUNTER — Inpatient Hospital Stay: Payer: BLUE CROSS/BLUE SHIELD | Attending: Hematology | Admitting: Hematology

## 2018-02-27 VITALS — BP 140/70 | HR 61 | Temp 98.1°F | Resp 18 | Ht 63.0 in | Wt 199.6 lb

## 2018-02-27 DIAGNOSIS — Z5112 Encounter for antineoplastic immunotherapy: Secondary | ICD-10-CM | POA: Diagnosis present

## 2018-02-27 DIAGNOSIS — C8248 Follicular lymphoma grade IIIb, lymph nodes of multiple sites: Secondary | ICD-10-CM

## 2018-02-27 DIAGNOSIS — Z95828 Presence of other vascular implants and grafts: Secondary | ICD-10-CM

## 2018-02-27 DIAGNOSIS — M25511 Pain in right shoulder: Secondary | ICD-10-CM

## 2018-02-27 DIAGNOSIS — I10 Essential (primary) hypertension: Secondary | ICD-10-CM

## 2018-02-27 DIAGNOSIS — Z7189 Other specified counseling: Secondary | ICD-10-CM

## 2018-02-27 DIAGNOSIS — C8580 Other specified types of non-Hodgkin lymphoma, unspecified site: Secondary | ICD-10-CM

## 2018-02-27 LAB — CBC WITH DIFFERENTIAL (CANCER CENTER ONLY)
BASOS PCT: 1 %
Basophils Absolute: 0 10*3/uL (ref 0.0–0.1)
EOS PCT: 5 %
Eosinophils Absolute: 0.3 10*3/uL (ref 0.0–0.5)
HEMATOCRIT: 35.3 % (ref 34.8–46.6)
Hemoglobin: 12.1 g/dL (ref 11.6–15.9)
Lymphocytes Relative: 18 %
Lymphs Abs: 1 10*3/uL (ref 0.9–3.3)
MCH: 31 pg (ref 25.1–34.0)
MCHC: 34.3 g/dL (ref 31.5–36.0)
MCV: 90.5 fL (ref 79.5–101.0)
MONO ABS: 0.5 10*3/uL (ref 0.1–0.9)
MONOS PCT: 9 %
NEUTROS ABS: 3.6 10*3/uL (ref 1.5–6.5)
Neutrophils Relative %: 67 %
PLATELETS: 203 10*3/uL (ref 145–400)
RBC: 3.9 MIL/uL (ref 3.70–5.45)
RDW: 12.9 % (ref 11.2–14.5)
WBC Count: 5.3 10*3/uL (ref 3.9–10.3)

## 2018-02-27 LAB — CMP (CANCER CENTER ONLY)
ALBUMIN: 4.1 g/dL (ref 3.5–5.0)
ALK PHOS: 77 U/L (ref 40–150)
ALT: 13 U/L (ref 0–55)
ANION GAP: 11 (ref 3–11)
AST: 20 U/L (ref 5–34)
BILIRUBIN TOTAL: 0.4 mg/dL (ref 0.2–1.2)
BUN: 11 mg/dL (ref 7–26)
CALCIUM: 9.7 mg/dL (ref 8.4–10.4)
CO2: 24 mmol/L (ref 22–29)
Chloride: 105 mmol/L (ref 98–109)
Creatinine: 0.79 mg/dL (ref 0.60–1.10)
GFR, Est AFR Am: >60 mL/min (ref >60)
GFR, Estimated: >60 mL/min (ref >60)
GLUCOSE: 138 mg/dL (ref 70–140)
Potassium: 4 mmol/L (ref 3.5–5.1)
SODIUM: 140 mmol/L (ref 136–145)
TOTAL PROTEIN: 7.2 g/dL (ref 6.4–8.3)

## 2018-02-27 LAB — RETICULOCYTES
RBC.: 3.9 MIL/uL (ref 3.70–5.45)
Retic Count, Absolute: 54.6 10*3/uL (ref 33.7–90.7)
Retic Ct Pct: 1.4 % (ref 0.7–2.1)

## 2018-02-27 LAB — LACTATE DEHYDROGENASE: LDH: 143 U/L (ref 125–245)

## 2018-02-27 MED ORDER — SODIUM CHLORIDE 0.9 % IV SOLN
Freq: Once | INTRAVENOUS | Status: AC
  Administered 2018-02-27: 11:00:00 via INTRAVENOUS

## 2018-02-27 MED ORDER — ACETAMINOPHEN 325 MG PO TABS
650.0000 mg | ORAL_TABLET | Freq: Once | ORAL | Status: AC
  Administered 2018-02-27: 650 mg via ORAL

## 2018-02-27 MED ORDER — SODIUM CHLORIDE 0.9% FLUSH
10.0000 mL | Freq: Once | INTRAVENOUS | Status: AC
  Administered 2018-02-27: 10 mL
  Filled 2018-02-27: qty 10

## 2018-02-27 MED ORDER — SODIUM CHLORIDE 0.9% FLUSH
10.0000 mL | INTRAVENOUS | Status: DC | PRN
  Administered 2018-02-27: 10 mL
  Filled 2018-02-27: qty 10

## 2018-02-27 MED ORDER — DIPHENHYDRAMINE HCL 25 MG PO CAPS
ORAL_CAPSULE | ORAL | Status: AC
Start: 2018-02-27 — End: ?
  Filled 2018-02-27: qty 2

## 2018-02-27 MED ORDER — DIPHENHYDRAMINE HCL 25 MG PO CAPS
50.0000 mg | ORAL_CAPSULE | Freq: Once | ORAL | Status: AC
  Administered 2018-02-27: 50 mg via ORAL

## 2018-02-27 MED ORDER — ACETAMINOPHEN 325 MG PO TABS
ORAL_TABLET | ORAL | Status: AC
  Filled 2018-02-27: qty 2

## 2018-02-27 MED ORDER — RITUXIMAB CHEMO INJECTION 500 MG/50ML
375.0000 mg/m2 | Freq: Once | INTRAVENOUS | Status: AC
  Administered 2018-02-27: 700 mg via INTRAVENOUS
  Filled 2018-02-27: qty 50

## 2018-02-27 MED ORDER — HEPARIN SOD (PORK) LOCK FLUSH 100 UNIT/ML IV SOLN
500.0000 [IU] | Freq: Once | INTRAVENOUS | Status: AC | PRN
  Administered 2018-02-27: 500 [IU]
  Filled 2018-02-27: qty 5

## 2018-02-27 NOTE — Patient Instructions (Signed)
[facility] Discharge Instructions for Patients Receiving Chemotherapy  Today you received the following chemotherapy agents Rituxan  To help prevent nausea and vomiting after your treatment, we encourage you to take your nausea medication as directed  If you develop nausea and vomiting that is not controlled by your nausea medication, call the clinic.   BELOW ARE SYMPTOMS THAT SHOULD BE REPORTED IMMEDIATELY:  *FEVER GREATER THAN 100.5 F  *CHILLS WITH OR WITHOUT FEVER  NAUSEA AND VOMITING THAT IS NOT CONTROLLED WITH YOUR NAUSEA MEDICATION  *UNUSUAL SHORTNESS OF BREATH  *UNUSUAL BRUISING OR BLEEDING  TENDERNESS IN MOUTH AND THROAT WITH OR WITHOUT PRESENCE OF ULCERS  *URINARY PROBLEMS  *BOWEL PROBLEMS  UNUSUAL RASH Items with * indicate a potential emergency and should be followed up as soon as possible.  Feel free to call the clinic should you have any questions or concerns. The clinic phone number is [phone].  Please show the [place] at check-in to the Emergency Department and triage nurse.

## 2018-03-03 ENCOUNTER — Telehealth: Payer: Self-pay | Admitting: Hematology

## 2018-03-03 NOTE — Telephone Encounter (Signed)
Called pt regarding scheduled appts on 8/7 and 10/2.  Patient also aware of appt at [facility] for port flush on 7/19.  Patient is my chart active.

## 2018-03-23 ENCOUNTER — Encounter: Payer: Self-pay | Admitting: Hematology

## 2018-03-23 ENCOUNTER — Inpatient Hospital Stay: Payer: BLUE CROSS/BLUE SHIELD | Attending: Hematology | Admitting: Medical

## 2018-03-23 VITALS — BP 149/91 | HR 95 | Temp 98.4°F | Resp 20

## 2018-03-23 DIAGNOSIS — C8248 Follicular lymphoma grade IIIb, lymph nodes of multiple sites: Secondary | ICD-10-CM | POA: Diagnosis not present

## 2018-03-23 DIAGNOSIS — L600 Ingrowing nail: Secondary | ICD-10-CM | POA: Diagnosis not present

## 2018-03-23 DIAGNOSIS — L03032 Cellulitis of left toe: Secondary | ICD-10-CM

## 2018-03-23 MED ORDER — MUPIROCIN 2 % EX OINT: TOPICAL_OINTMENT | CUTANEOUS | 0 refills | Status: DC

## 2018-03-23 MED ORDER — TRAMADOL HCL 50 MG PO TABS: 50.0000 mg | ORAL_TABLET | Freq: Four times a day (QID) | ORAL | 0 refills | Status: DC | PRN

## 2018-03-23 MED ORDER — CEPHALEXIN 500 MG PO CAPS: 500.0000 mg | ORAL_CAPSULE | Freq: Three times a day (TID) | ORAL | 0 refills | Status: DC

## 2018-03-23 NOTE — Patient Instructions (Signed)

## 2018-03-23 NOTE — Progress Notes (Signed)
[facility] Progress Note   Katelyn Reeves [id number] 14-Nov-1950 67 y.o.  Katelyn Reeves is managed by Dr. Sullivan Lone  Actively treated with chemotherapy/immunotherapy: yes  Current Therapy: Rituximab  Last Treated: 02/27/2018 (cycle 5, day 1)  Assessment: Plan:    Paronychia of great toe, left - Plan: cephALEXin (KEFLEX) 500 MG capsule, mupirocin ointment (BACTROBAN) 2 %, traMADol (ULTRAM) 50 MG tablet  Cellulitis of toe of left foot - Plan: cephALEXin (KEFLEX) 500 MG capsule, mupirocin ointment (BACTROBAN) 2 %, traMADol (ULTRAM) 50 MG tablet   Paronychia of the left great toe with cellulitis and pain: The patient was given a prescription for Keflex 500 mg p.o. 3 times daily, Bactroban 2% ointment with instructions to use 4 times daily and tramadol 50 mg p.o. every 6 hours as needed for pain.  Please see After Visit Summary for patient specific instructions.  Future Appointments  Date Time Provider [place]  04/10/2018  8:00 AM [facility] IV/MED INFUSION ROOM [facility] None  04/29/2018  8:15 AM [facility] LAB 3 [facility] None  04/29/2018  8:30 AM [facility] [place] FLUSH [facility] None  04/29/2018  9:40 AM Brunetta Genera, MD [facility] None  04/29/2018 11:00 AM [facility] INFUSION [facility] None  06/24/2018  8:30 AM [facility] LAB 2 [facility] None  06/24/2018  8:45 AM [facility] [place] FLUSH [facility] None  06/24/2018  9:20 AM Brunetta Genera, MD [facility] None  06/24/2018 10:15 AM [facility] INFUSION [facility] None  08/19/2018  8:30 AM [facility] LAB 2 [facility] None  08/19/2018  8:45 AM [facility] [place] FLUSH [facility] None  08/19/2018  9:20 AM Brunetta Genera, MD [facility] None  08/19/2018 10:15 AM [facility] INFUSION [facility] None    No orders of the defined types were placed in this encounter.      Subjective:   Patient ID:  Katelyn Reeves is a 67 y.o. (DOB [birth year]) female.  Chief Complaint:  Chief Complaint  Patient  presents with  . Toe Infection    HPI Katelyn Reeves is a 67 year old female with a history of a follicular lymphoma who is treated with maintenance rituximab with cycle 5 last dosed on 02/27/2018.  She reports working out in her yard this past weekend while wearing wet socks and wet shoes.  She subsequently developed pain, swelling with erythema along the cuticle and left lateral edge of her great left toenail.  She had an old prescription for amoxicillin at home and has taken 5 doses.  She is having difficulty ambulating because of the pain.  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  She is to fly to [place] next week.  She denies fevers, chills, sweats. The patient has been treating the area with triple antibiotic ointment.  Medications: I have reviewed the patient's current medications.  Allergies:  Allergies  Allergen Reactions  . Sulfa Antibiotics Rash    Past Medical History:  Diagnosis Date  . Back pain   . Headache    migraines  . Shingles   . Swollen lymph nodes    left axilla, rectoperitonal     Past Surgical History:  Procedure Laterality Date  . ABDOMINAL HYSTERECTOMY  1980   total abdominal hysterectomy with tubes and ovaries  . BREAST SURGERY Left 1980's   breast biopsy  . CYST EXCISION Left 2017   left scapula  . HEMORRHOID SURGERY  1970's  . IR GENERIC HISTORICAL  12/20/2016   IR FLUORO GUIDE PORT INSERTION RIGHT 12/20/2016 Markus Daft, MD [facility]  .  IR GENERIC HISTORICAL  12/20/2016   IR US GUIDE VASC ACCESS RIGHT 12/20/2016 Markus Daft, MD [facility]  . TUBAL LIGATION  1980's   before hysterectomy  . WRIST GANGLION EXCISION Left 1970's    No family history on file.  Social History   Socioeconomic History  . Marital status: Divorced    Spouse name: Not on file  . Number of children: Not on file  . Years of education: Not on file  . Highest education level: Not on file  Occupational History  . Not on file  Social Needs  .  Financial resource strain: Not on file  . Food insecurity:    Worry: Not on file    Inability: Not on file  . Transportation needs:    Medical: Not on file    Non-medical: Not on file  Tobacco Use  . Smoking status: Never Smoker  . Smokeless tobacco: Never Used  Substance and Sexual Activity  . Alcohol use: No  . Drug use: No  . Sexual activity: Not on file  Lifestyle  . Physical activity:    Days per week: Not on file    Minutes per session: Not on file  . Stress: Not on file  Relationships  . Social connections:    Talks on phone: Not on file    Gets together: Not on file    Attends religious service: Not on file    Active member of club or organization: Not on file    Attends meetings of clubs or organizations: Not on file    Relationship status: Not on file  . Intimate partner violence:    Fear of current or ex partner: Not on file    Emotionally abused: Not on file    Physically abused: Not on file    Forced sexual activity: Not on file  Other Topics Concern  . Not on file  Social History Narrative  . Not on file    Past Medical History, Surgical history, Social history, and Family history were reviewed and updated as appropriate.   Please see review of systems for further details on the patient's review from today.   Review of Systems:  Review of Systems  Constitutional: Negative for chills, diaphoresis and fever.  Skin: Positive for color change.       pain, swelling with erythema along the cuticle and left lateral edge of her great left toenail    Objective:   Physical Exam:  BP (!) 149/91 (BP Location: Right Arm, Patient Position: Sitting)   Pulse 95   Temp 98.4 F (36.9 C) (Oral)   Resp 20   SpO2 95%  ECOG: 0  Physical Exam  Constitutional: No distress.  HENT:  Head: Normocephalic and atraumatic.  Skin: Skin is warm and dry. She is not diaphoretic.  Erythema along the and lateral edge of the left great toenail.  No exudate noted.  Tenderness.        Lab Review:     Component Value Date/Time   NA 140 02/27/2018 0852   NA 139 09/04/2017 1006   K 4.0 02/27/2018 0852   K 4.2 09/04/2017 1006   CL 105 02/27/2018 0852   CO2 24 02/27/2018 0852   CO2 22 09/04/2017 1006   GLUCOSE 138 02/27/2018 0852   GLUCOSE 136 09/04/2017 1006   BUN 11 02/27/2018 0852   BUN 15.6 09/04/2017 1006   CREATININE 0.79 02/27/2018 0852   CREATININE 0.7 09/04/2017 1006   CALCIUM 9.7 02/27/2018 0852  CALCIUM 9.4 09/04/2017 1006   PROT 7.2 02/27/2018 0852   PROT 7.4 09/04/2017 1006   ALBUMIN 4.1 02/27/2018 0852   ALBUMIN 4.1 09/04/2017 1006   AST 20 02/27/2018 0852   AST 22 09/04/2017 1006   ALT 13 02/27/2018 0852   ALT 17 09/04/2017 1006   ALKPHOS 77 02/27/2018 0852   ALKPHOS 77 09/04/2017 1006   BILITOT 0.4 02/27/2018 0852   BILITOT 0.42 09/04/2017 1006   GFRNONAA >60 02/27/2018 0852   GFRAA >60 02/27/2018 0852       Component Value Date/Time   WBC 5.3 02/27/2018 0852   WBC 6.0 12/28/2017 0954   RBC 3.90 02/27/2018 0852   RBC 3.90 02/27/2018 0852   HGB 12.1 02/27/2018 0852   HGB 12.6 09/04/2017 1006   HCT 35.3 02/27/2018 0852   HCT 36.6 09/04/2017 1006   PLT 203 02/27/2018 0852   PLT 222 09/04/2017 1006   MCV 90.5 02/27/2018 0852   MCV 88.4 09/04/2017 1006   MCH 31.0 02/27/2018 0852   MCHC 34.3 02/27/2018 0852   RDW 12.9 02/27/2018 0852   RDW 13.1 09/04/2017 1006   LYMPHSABS 1.0 02/27/2018 0852   LYMPHSABS 1.1 09/04/2017 1006   MONOABS 0.5 02/27/2018 0852   MONOABS 0.3 09/04/2017 1006   EOSABS 0.3 02/27/2018 0852   EOSABS 0.2 09/04/2017 1006   BASOSABS 0.0 02/27/2018 0852   BASOSABS 0.0 09/04/2017 1006   -------------------------------  Imaging from last 24 hours (if applicable):  Radiology interpretation: No results found.

## 2018-04-06 ENCOUNTER — Encounter: Payer: Self-pay | Admitting: Medical

## 2018-04-06 ENCOUNTER — Telehealth: Payer: Self-pay | Admitting: Emergency Medicine

## 2018-04-06 ENCOUNTER — Inpatient Hospital Stay (HOSPITAL_BASED_OUTPATIENT_CLINIC_OR_DEPARTMENT_OTHER): Payer: BLUE CROSS/BLUE SHIELD | Admitting: Medical

## 2018-04-06 VITALS — BP 142/80 | HR 71 | Temp 98.4°F | Resp 18 | Ht 63.0 in | Wt 197.0 lb

## 2018-04-06 DIAGNOSIS — C8248 Follicular lymphoma grade IIIb, lymph nodes of multiple sites: Secondary | ICD-10-CM | POA: Diagnosis not present

## 2018-04-06 DIAGNOSIS — L03032 Cellulitis of left toe: Secondary | ICD-10-CM | POA: Diagnosis not present

## 2018-04-06 DIAGNOSIS — L600 Ingrowing nail: Secondary | ICD-10-CM | POA: Diagnosis not present

## 2018-04-06 MED ORDER — DOXYCYCLINE HYCLATE 100 MG PO TBEC: 100.0000 mg | DELAYED_RELEASE_TABLET | Freq: Two times a day (BID) | ORAL | 0 refills | Status: DC

## 2018-04-06 MED ORDER — MUPIROCIN 2 % EX OINT: TOPICAL_OINTMENT | CUTANEOUS | 1 refills | Status: DC

## 2018-04-06 NOTE — Progress Notes (Signed)
[facility] Progress Note   Katelyn Reeves [id number] 06-25-1951 67 y.o.    Katelyn Reeves is managed by Dr. Sullivan Lone  Actively treated with chemotherapy/immunotherapy: yes  Current Therapy: Rituximab  Last Treated: 02/27/2018 (cycle 5, day 1)  Assessment: Plan:    Ingrown left greater toenail - Plan: doxycycline (DORYX) 100 MG EC tablet, mupirocin ointment (BACTROBAN) 2 %   Ingrown left great toenail with continued cellulitis: Katelyn Reeves was given a prescription for doxycycline 100 mg p.o. twice daily x7 days and was given a refill of Bactroban.  Katelyn Reeves was also told that Katelyn Reeves may need to have her toenail removed should it not show improvement soon.  Please see After Visit Summary for patient specific instructions.  Future Appointments  Date Time Provider [place]  04/10/2018  8:00 AM [facility] IV/MED INFUSION ROOM [facility] None  04/29/2018  8:15 AM [facility] LAB 3 [facility] None  04/29/2018  8:30 AM [facility] [place] FLUSH [facility] None  04/29/2018  9:40 AM Brunetta Genera, MD [facility] None  04/29/2018 11:00 AM [facility] INFUSION [facility] None  06/24/2018  8:30 AM [facility] LAB 2 [facility] None  06/24/2018  8:45 AM [facility] [place] FLUSH [facility] None  06/24/2018  9:20 AM Brunetta Genera, MD [facility] None  06/24/2018 10:15 AM [facility] INFUSION [facility] None  08/19/2018  8:30 AM [facility] LAB 2 [facility] None  08/19/2018  8:45 AM [facility] [place] FLUSH [facility] None  08/19/2018  9:20 AM Brunetta Genera, MD [facility] None  08/19/2018 10:15 AM [facility] INFUSION [facility] None    No orders of the defined types were placed in this encounter.      Subjective:   Patient ID:  Katelyn Reeves is a 67 y.o. (DOB 10-31-1950) female.  Chief Complaint:  Chief Complaint  Patient presents with  . Wound recheck    HPI Katelyn Reeves is a 67 year old female with a history of a follicular lymphoma who is managed by Dr. Irene Limbo. Katelyn Reeves  is treated with maintenance rituximab with cycle 5 last dosed on 02/27/2018.  Katelyn Reeves was seen on 03/23/2018 after working in the yard while wearing wet socks and wet shoes.  Katelyn Reeves developed pain, swelling with erythema along the cuticle and left lateral edge of her great left toenail.  Katelyn Reeves had a prescription for amoxicillin at home and took 5 doses. Katelyn Reeves had also been triple antibiotic ointment to the area of concern. Katelyn Reeves was given Keflex and mupirocin ointment along with tramadol for pain. Katelyn Reeves contacted our office this morning reporting that the redness and swelling of her left great toe was much improved, but that the redness remains next to nail bed with one small/hard/white painful point. Katelyn Reeves continues using the topical Bactroban 3 times a day and elevated her foot most of yesterday with no change in redness.  Katelyn Reeves requested to be seen today.  Medications: I have reviewed the patient's current medications.  Allergies:  Allergies  Allergen Reactions  . Sulfa Antibiotics Rash    Past Medical History:  Diagnosis Date  . Back pain   . Headache    migraines  . Shingles   . Swollen lymph nodes    left axilla, rectoperitonal     Past Surgical History:  Procedure Laterality Date  . ABDOMINAL HYSTERECTOMY  1980   total abdominal hysterectomy with tubes and ovaries  . BREAST SURGERY Left 1980's   breast biopsy  . CYST EXCISION Left 2017   left scapula  . HEMORRHOID SURGERY  1970's  .  IR GENERIC HISTORICAL  12/20/2016   IR FLUORO GUIDE PORT INSERTION RIGHT 12/20/2016 Markus Daft, MD [facility]  . IR GENERIC HISTORICAL  12/20/2016   IR US GUIDE VASC ACCESS RIGHT 12/20/2016 Markus Daft, MD [facility]  . TUBAL LIGATION  1980's   before hysterectomy  . WRIST GANGLION EXCISION Left 1970's    No family history on file.  Social History   Socioeconomic History  . Marital status: Divorced    Spouse name: Not on file  . Number of children: Not on file  . Years of education: Not on file  .  Highest education level: Not on file  Occupational History  . Not on file  Social Needs  . Financial resource strain: Not on file  . Food insecurity:    Worry: Not on file    Inability: Not on file  . Transportation needs:    Medical: Not on file    Non-medical: Not on file  Tobacco Use  . Smoking status: Never Smoker  . Smokeless tobacco: Never Used  Substance and Sexual Activity  . Alcohol use: No  . Drug use: No  . Sexual activity: Not on file  Lifestyle  . Physical activity:    Days per week: Not on file    Minutes per session: Not on file  . Stress: Not on file  Relationships  . Social connections:    Talks on phone: Not on file    Gets together: Not on file    Attends religious service: Not on file    Active member of club or organization: Not on file    Attends meetings of clubs or organizations: Not on file    Relationship status: Not on file  . Intimate partner violence:    Fear of current or ex partner: Not on file    Emotionally abused: Not on file    Physically abused: Not on file    Forced sexual activity: Not on file  Other Topics Concern  . Not on file  Social History Narrative  . Not on file    Past Medical History, Surgical history, Social history, and Family history were reviewed and updated as appropriate.   Please see review of systems for further details on the patient's review from today.   Review of Systems:  Review of Systems  Constitutional: Negative for chills, diaphoresis and fever.  Skin:       Pain swelling and erythema along the medial edge of the left great toenail.    Objective:   Physical Exam:  BP (!) 142/80 (BP Location: Right Arm, Patient Position: Sitting) Comment: Notified Nurse of Bp  Pulse 71   Temp 98.4 F (36.9 C) (Oral)   Resp 18   Ht 5' 3"  (1.6 m)   Wt 197 lb (89.4 kg)   SpO2 100%   BMI 34.90 kg/m  ECOG: 0  Physical Exam  Constitutional: No distress.  HENT:  Head: Normocephalic and atraumatic.    Neurological: Katelyn Reeves is alert. Coordination normal.  Skin: Skin is warm and dry. Katelyn Reeves is not diaphoretic. There is erythema.  Erythema, swelling, and tenderness along the medial edge of the left great toe.  Psychiatric: Katelyn Reeves has a normal mood and affect. Her behavior is normal. Judgment and thought content normal.    Lab Review:     Component Value Date/Time   NA 140 02/27/2018 0852   NA 139 09/04/2017 1006   K 4.0 02/27/2018 0852   K 4.2 09/04/2017 1006  CL 105 02/27/2018 0852   CO2 24 02/27/2018 0852   CO2 22 09/04/2017 1006   GLUCOSE 138 02/27/2018 0852   GLUCOSE 136 09/04/2017 1006   BUN 11 02/27/2018 0852   BUN 15.6 09/04/2017 1006   CREATININE 0.79 02/27/2018 0852   CREATININE 0.7 09/04/2017 1006   CALCIUM 9.7 02/27/2018 0852   CALCIUM 9.4 09/04/2017 1006   PROT 7.2 02/27/2018 0852   PROT 7.4 09/04/2017 1006   ALBUMIN 4.1 02/27/2018 0852   ALBUMIN 4.1 09/04/2017 1006   AST 20 02/27/2018 0852   AST 22 09/04/2017 1006   ALT 13 02/27/2018 0852   ALT 17 09/04/2017 1006   ALKPHOS 77 02/27/2018 0852   ALKPHOS 77 09/04/2017 1006   BILITOT 0.4 02/27/2018 0852   BILITOT 0.42 09/04/2017 1006   GFRNONAA >60 02/27/2018 0852   GFRAA >60 02/27/2018 0852       Component Value Date/Time   WBC 5.3 02/27/2018 0852   WBC 6.0 12/28/2017 0954   RBC 3.90 02/27/2018 0852   RBC 3.90 02/27/2018 0852   HGB 12.1 02/27/2018 0852   HGB 12.6 09/04/2017 1006   HCT 35.3 02/27/2018 0852   HCT 36.6 09/04/2017 1006   PLT 203 02/27/2018 0852   PLT 222 09/04/2017 1006   MCV 90.5 02/27/2018 0852   MCV 88.4 09/04/2017 1006   MCH 31.0 02/27/2018 0852   MCHC 34.3 02/27/2018 0852   RDW 12.9 02/27/2018 0852   RDW 13.1 09/04/2017 1006   LYMPHSABS 1.0 02/27/2018 0852   LYMPHSABS 1.1 09/04/2017 1006   MONOABS 0.5 02/27/2018 0852   MONOABS 0.3 09/04/2017 1006   EOSABS 0.3 02/27/2018 0852   EOSABS 0.2 09/04/2017 1006   BASOSABS 0.0 02/27/2018 0852   BASOSABS 0.0 09/04/2017 1006    -------------------------------  Imaging from last 24 hours (if applicable):  Radiology interpretation: No results found.

## 2018-04-06 NOTE — Telephone Encounter (Signed)
Returning VM concerning pt's continuing cellulitis symptoms.  Advised pt to come at 1330 for [facility] appt with PA Lucianne Lei for eval.  PA aware.  Pt verbalized understanding.

## 2018-04-06 NOTE — Patient Instructions (Signed)

## 2018-04-07 ENCOUNTER — Telehealth: Payer: Self-pay | Admitting: Emergency Medicine

## 2018-04-07 NOTE — Telephone Encounter (Signed)
Returning VM from [employer].  Pt's insurance will not cover delayed release doxycycline, pharmacist called to ask if pt could be switched to immediate release.  Verbal ok per PA Lucianne Lei.  Per pharmacist when I called back pt did not want to wait for medication to be changed and decided to have first medication filled without using insurance.  PA Lucianne Lei aware.

## 2018-04-08 ENCOUNTER — Other Ambulatory Visit: Payer: Self-pay

## 2018-04-08 ENCOUNTER — Other Ambulatory Visit: Payer: Self-pay | Admitting: Hematology

## 2018-04-08 DIAGNOSIS — L600 Ingrowing nail: Secondary | ICD-10-CM

## 2018-04-10 ENCOUNTER — Encounter (HOSPITAL_COMMUNITY)
Admission: RE | Admit: 2018-04-10 | Discharge: 2018-04-10 | Disposition: A | Discharge status: Home / Self Care | Payer: BLUE CROSS/BLUE SHIELD | Source: Ambulatory Visit | Attending: Hematology | Admitting: Hematology

## 2018-04-10 ENCOUNTER — Encounter (HOSPITAL_COMMUNITY): Payer: BLUE CROSS/BLUE SHIELD

## 2018-04-10 DIAGNOSIS — C8248 Follicular lymphoma grade IIIb, lymph nodes of multiple sites: Secondary | ICD-10-CM | POA: Diagnosis not present

## 2018-04-10 DIAGNOSIS — Z95828 Presence of other vascular implants and grafts: Secondary | ICD-10-CM | POA: Insufficient documentation

## 2018-04-10 MED ORDER — HEPARIN SOD (PORK) LOCK FLUSH 100 UNIT/ML IV SOLN
500.0000 [IU] | INTRAVENOUS | Status: AC | PRN
  Administered 2018-04-10: 500 [IU]

## 2018-04-10 MED ORDER — SODIUM CHLORIDE 0.9% FLUSH
10.0000 mL | INTRAVENOUS | Status: AC | PRN
  Administered 2018-04-10: 10 mL

## 2018-04-24 ENCOUNTER — Encounter: Payer: Self-pay | Admitting: Hematology

## 2018-04-28 ENCOUNTER — Encounter: Payer: Self-pay | Admitting: Hematology

## 2018-04-28 NOTE — Progress Notes (Signed)
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 female who has been referred to [place] by Dr .Asencion Noble, MD for evaluation and management of suspected non-Hodgkin's lymphoma.  Patient notes that she presented with back pain for 4-6 weeks and eventually had pain radiating to her right inguinal area. She notes that she has had interstitial cystitis in the past. She was thought to have a urinary tract infection and was treated with Macrodantin. Says the pain got worse and started radiating into the right groin she had a CT of the abdomen renal stone protocol on 10/25/2016 which showed extensive retroperitoneal lymphadenopathy concerning for lymphoproliferative disorder. She was incidentally noted to have a nodular border of the liver with possible liver cirrhosis. No urinary stones noted.  She subsequently had a PET CT scan ordered by her primary care physician which was done on 11/07/2016. This showed diffuse hypermetabolic spleen with mild splenomegaly. Hypermetabolic adenopathy in the left supraclavicular, bilateral axillary, peripancreatic, gastrohepatic ligament, celiac, porta hepatis, periaortic, and left external iliac chains. Hypermetabolic mesenteric lymph nodes. Appearance favors lymphoma.  Patient notes some night sweats for 2 weeks. Has lost about 10 pounds in the last month. No acute fevers or chills. Notes the back pain is controlled with when necessary Vicodin.  INTERVAL HISTORY  Katelyn Reeves is here for her scheduled follow-up regarding her follicular lymphoma and fourth maintenance dose of Rituxan. The patient's last visit with [place] was on 02/27/18. The pt reports that she is doing well overall and is looking forward  to her trip to [place].   The pt reports that she has had a URI for the last 11 days with a productive cough (clear mucous), and ear stuffiness (worse on right). She used sudafed for a couple days and then stopped. Overall she notes that her symptoms have been improving.   She also appeared to the symptom clinic on 04/06/18 with cellulitis on her left foot, which did get better but has not resolved after using Kefelex, Bactroban and Doxycycline. She has been wearing open toed shoes.   Lab results today (04/29/18) of CBC w/diff, CMP is as follows: all values are WNL except for Glucose at 126 (non-fasting). 04/29/18 LDH is WNL at 143  On review of systems, pt reports stable weight, good appetite, stable energy levels, pedal cellulitis, productive cough, ear stuffiness, and denies noticing any new lumps or bumps, back pains, and any other symptoms.  MEDICAL HISTORY:  #1 fibrocystic disease of the breast #2 hypertension #3 constipation #4 duplicated right sided ureters #5 imaging concern for possible liver cirrhosis  SURGICAL HISTORY:  #1 total abdominal hysterectomy with bilateral salpingo-oophorectomy for ovarian cyst. #2 Lipoma removal left upper back #3 hemorrhoidectomy  SOCIAL HISTORY: Social History   Socioeconomic History  . Marital status: Divorced    Spouse name: Not on file  . Number of children: Not on file  . Years of education: Not on file  . Highest education level: Not on file  Occupational History  . Not on file  Social Needs  . Financial resource strain: Not on file  . Food insecurity:    Worry: Not on file    Inability: Not on file  . Transportation needs:  Medical: Not on file    Non-medical: Not on file  Tobacco Use  . Smoking status: Never Smoker  . Smokeless tobacco: Never Used  Substance and Sexual Activity  . Alcohol use: No  . Drug use: No  . Sexual activity: Not on file  Lifestyle  . Physical activity:    Days per week: Not on file    Minutes per  session: Not on file  . Stress: Not on file  Relationships  . Social connections:    Talks on phone: Not on file    Gets together: Not on file    Attends religious service: Not on file    Active member of club or organization: Not on file    Attends meetings of clubs or organizations: Not on file    Relationship status: Not on file  . Intimate partner violence:    Fear of current or ex partner: Not on file    Emotionally abused: Not on file    Physically abused: Not on file    Forced sexual activity: Not on file  Other Topics Concern  . Not on file  Social History Narrative  . Not on file  Nonsmoker no issues with alcohol use or drug use. 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   FAMILY HISTORY:  Notes her mother had ovarian cancer followed by leukemia and died at age 42 years Maternal aunt gastric cancer  ALLERGIES:  is allergic to sulfa antibiotics.  MEDICATIONS:  Current Outpatient Medications  Medication Sig Dispense Refill  . amoxicillin-clavulanate (AUGMENTIN) 875-125 MG tablet Take 1 tablet by mouth every 12 (twelve) hours. 14 tablet 0  . cephALEXin (KEFLEX) 500 MG capsule Take 1 capsule (500 mg total) by mouth 3 (three) times daily. 21 capsule 0  . HYDROcodone-acetaminophen (NORCO/VICODIN) 5-325 MG tablet as needed.  0  . lidocaine-prilocaine (EMLA) cream Apply to port-a-cath 1 hr prior to treatment/lab draw 30 g 0  . mupirocin ointment (BACTROBAN) 2 % Apply to infected area 4 times daily 30 g 1  . traMADol (ULTRAM) 50 MG tablet Take 1 tablet (50 mg total) by mouth every 6 (six) hours as needed. 30 tablet 0  . valACYclovir (VALTREX) 1000 MG tablet Take 1 tablet (1,000 mg total) by mouth 2 (two) times daily. For shingles outbreak 14 tablet 0   No current facility-administered medications for this visit.    Facility-Administered Medications Ordered in Other Visits  Medication Dose Route Frequency Provider Last Rate Last Dose    . sodium chloride flush (NS) 0.9 % injection 10 mL  10 mL Intracatheter PRN Brunetta Genera, MD   10 mL at 07/04/17 1540    REVIEW OF SYSTEMS:    A 10+ POINT REVIEW OF SYSTEMS WAS OBTAINED including neurology, dermatology, psychiatry, cardiac, respiratory, lymph, extremities, GI, GU, Musculoskeletal, constitutional, breasts, reproductive, HEENT.  All pertinent positives are noted in the HPI.  All others are negative.   PHYSICAL EXAMINATION: ECOG PERFORMANCE STATUS: 1 - Symptomatic but completely ambulatory  . Vitals:   04/29/18 0907  BP: (!) 146/91  Pulse: 80  Resp: 18  Temp: 98.6 F (37 C)  SpO2: 96%   Filed Weights   04/29/18 0907  Weight: 197 lb (89.4 kg)   .Body mass index is 34.9 kg/m.  GENERAL:alert, in no acute distress and comfortable SKIN: no acute rashes, no significant lesions EYES: conjunctiva are pink and non-injected, sclera anicteric OROPHARYNX: MMM, no exudates, no oropharyngeal erythema or ulceration NECK:  supple, no JVD LYMPH:  no palpable lymphadenopathy in the cervical, axillary or inguinal regions LUNGS: clear to auscultation b/l with normal respiratory effort HEART: regular rate & rhythm ABDOMEN:  normoactive bowel sounds , non tender, not distended. No palpable hepatosplenomegaly.  Extremity: no pedal edema PSYCH: alert & oriented x 3 with fluent speech NEURO: no focal motor/sensory deficits    LABORATORY DATA:  I have reviewed the data as listed  . CBC Latest Ref Rng & Units 04/29/2018 02/27/2018 01/01/2018  WBC 3.9 - 10.3 K/uL 5.4 5.3 5.1  Hemoglobin 11.6 - 15.9 g/dL 12.4 12.1 12.7  Hematocrit 34.8 - 46.6 % 36.3 35.3 37.3  Platelets 145 - 400 K/uL 231 203 229   CBC    Component Value Date/Time   WBC 5.4 04/29/2018 0828   RBC 4.04 04/29/2018 0828   HGB 12.4 04/29/2018 0828   HGB 12.1 02/27/2018 0852   HGB 12.6 09/04/2017 1006   HCT 36.3 04/29/2018 0828   HCT 36.6 09/04/2017 1006   PLT 231 04/29/2018 0828   PLT 203 02/27/2018 0852    PLT 222 09/04/2017 1006   MCV 89.9 04/29/2018 0828   MCV 88.4 09/04/2017 1006   MCH 30.7 04/29/2018 0828   MCHC 34.2 04/29/2018 0828   RDW 12.7 04/29/2018 0828   RDW 13.1 09/04/2017 1006   LYMPHSABS 1.2 04/29/2018 0828   LYMPHSABS 1.1 09/04/2017 1006   MONOABS 0.3 04/29/2018 0828   MONOABS 0.3 09/04/2017 1006   EOSABS 0.3 04/29/2018 0828   EOSABS 0.2 09/04/2017 1006   BASOSABS 0.0 04/29/2018 0828   BASOSABS 0.0 09/04/2017 1006   . CMP Latest Ref Rng & Units 04/29/2018 02/27/2018 01/01/2018  Glucose 70 - 99 mg/dL 126(H) 138 118  BUN 8 - 23 mg/dL 11 11 11   Creatinine 0.44 - 1.00 mg/dL 0.75 0.79 0.76  Sodium 135 - 145 mmol/L 141 140 141  Potassium 3.5 - 5.1 mmol/L 3.9 4.0 4.1  Chloride 98 - 111 mmol/L 105 105 105  CO2 22 - 32 mmol/L 24 24 26   Calcium 8.9 - 10.3 mg/dL 9.4 9.7 9.7  Total Protein 6.5 - 8.1 g/dL 7.5 7.2 7.4  Total Bilirubin 0.3 - 1.2 mg/dL 0.6 0.4 0.4  Alkaline Phos 38 - 126 U/L 75 77 72  AST 15 - 41 U/L 24 20 21   ALT 0 - 44 U/L 25 13 20    . Lab Results  Component Value Date   LDH 143 04/29/2018   Component     Latest Ref Rng & Units 11/12/2016  Hep C Virus Ab     0.0 - 0.9 s/co ratio <0.1  Hepatitis B Surface Ag     Negative Negative  Hep B Core Ab, Tot     Negative Negative       RADIOGRAPHIC STUDIES: I have personally reviewed the radiological images as listed and agreed with the findings in the report. No results found.  ASSESSMENT & PLAN:   67 y.o. 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  Presented with Generalized FDG avid lymphadenopathy - predominantly in the abdomen/Retroperitoneum. Also noted to have left supraclavicular and bilateral axillary left more than right FDG avid lymphadenopathy. Based on imaging this would represent at least Stage III disease if this were a lymphoma. LDH level is not  significantly elevated. Blood counts are stable. PET/CT scan does show  fairly active disease which is FDG avid LNadenopathy and FDG avid splenomegaly. Patient has some constitutional symptoms with about a 10 pound weight loss and some night sweats. Hepatitis profile negative. No other obvious new focal symptoms.  Patient has had an ECHO which shows nl EF  Patient is status post 6 cycles of R CHOP with no prohibitive toxicities.  PET/CT on 8/72018 - showed complete metabolic response with no metabolically active disease. A few borderline lymph nodes in the retroperitoneum.  CT chest/abd/pelvis 11/04/2017:  No new or progressive findings to suggest recurrent disease. The small abdominal retroperitoneal lymph nodes are unchanged when comparing to PET-CT of 04/29/2017.   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 Improved with massage therapy  #4 Recent resolved mild shingles  #5 Recent resolved parotitis  PLAN:  -Discussed port removal again with pt to avoid risks of infections and blood clots - pt would like to continue to think about this -Discussed pt labwork today, 04/29/18; all blood counts and chemistries are normal, as is LDH at 143 -Will hold Rituxan for one month given multiple infection concerns -Recommended that the pt see a podiatrist given cellulitis with an ingrown toe nail - providing a referral.  -Continue keeping area clean and dry and continue applying Bactroban -The pt shows no clinical or lab progression of her follicular lymphoma at this time -Will see the pt back in one month with labs    Please cancel current existing labs/MD and Rituxan appointments. Please reschedule Rituxan due today for first week of September 2019 with labs and MD visit    All of the patients questions were answered with apparent satisfaction. The patient knows to call the clinic with any problems, questions or  concerns.  The total time spent in the appt was 35 minutes and more than 50% was on counseling and direct patient cares.     Sullivan Lone MD Katelyn AAHIVMS [facility] Hematology/Oncology Physician [facility]  (Office):       [phone] (Work cell):  [phone] (Fax):           [phone]  I, Baldwin [place], am acting as a scribe for Dr. Irene Limbo  .I have reviewed the above documentation for accuracy and completeness, and I agree with the above. Brunetta Genera MD

## 2018-04-29 ENCOUNTER — Inpatient Hospital Stay: Payer: BLUE CROSS/BLUE SHIELD

## 2018-04-29 ENCOUNTER — Ambulatory Visit: Payer: BLUE CROSS/BLUE SHIELD

## 2018-04-29 ENCOUNTER — Telehealth: Payer: Self-pay | Admitting: Hematology

## 2018-04-29 ENCOUNTER — Inpatient Hospital Stay: Payer: BLUE CROSS/BLUE SHIELD | Attending: Hematology | Admitting: Hematology

## 2018-04-29 ENCOUNTER — Other Ambulatory Visit: Payer: BLUE CROSS/BLUE SHIELD

## 2018-04-29 ENCOUNTER — Encounter: Payer: Self-pay | Admitting: Hematology

## 2018-04-29 VITALS — BP 146/91 | HR 80 | Temp 98.6°F | Resp 18 | Ht 63.0 in | Wt 197.0 lb

## 2018-04-29 DIAGNOSIS — C8248 Follicular lymphoma grade IIIb, lymph nodes of multiple sites: Secondary | ICD-10-CM | POA: Diagnosis not present

## 2018-04-29 DIAGNOSIS — I10 Essential (primary) hypertension: Secondary | ICD-10-CM | POA: Insufficient documentation

## 2018-04-29 DIAGNOSIS — L600 Ingrowing nail: Secondary | ICD-10-CM | POA: Diagnosis not present

## 2018-04-29 DIAGNOSIS — Z95828 Presence of other vascular implants and grafts: Secondary | ICD-10-CM

## 2018-04-29 DIAGNOSIS — L03032 Cellulitis of left toe: Secondary | ICD-10-CM | POA: Insufficient documentation

## 2018-04-29 DIAGNOSIS — M25512 Pain in left shoulder: Secondary | ICD-10-CM | POA: Insufficient documentation

## 2018-04-29 LAB — CBC WITH DIFFERENTIAL/PLATELET
BASOS PCT: 1 %
Basophils Absolute: 0 10*3/uL (ref 0.0–0.1)
EOS ABS: 0.3 10*3/uL (ref 0.0–0.5)
Eosinophils Relative: 5 %
HCT: 36.3 % (ref 34.8–46.6)
Hemoglobin: 12.4 g/dL (ref 11.6–15.9)
Lymphocytes Relative: 22 %
Lymphs Abs: 1.2 10*3/uL (ref 0.9–3.3)
MCH: 30.7 pg (ref 25.1–34.0)
MCHC: 34.2 g/dL (ref 31.5–36.0)
MCV: 89.9 fL (ref 79.5–101.0)
MONO ABS: 0.3 10*3/uL (ref 0.1–0.9)
MONOS PCT: 6 %
Neutro Abs: 3.6 10*3/uL (ref 1.5–6.5)
Neutrophils Relative %: 66 %
Platelets: 231 10*3/uL (ref 145–400)
RBC: 4.04 MIL/uL (ref 3.70–5.45)
RDW: 12.7 % (ref 11.2–14.5)
WBC: 5.4 10*3/uL (ref 3.9–10.3)

## 2018-04-29 LAB — CMP (CANCER CENTER ONLY)
ALBUMIN: 4.2 g/dL (ref 3.5–5.0)
ALK PHOS: 75 U/L (ref 38–126)
ALT: 25 U/L (ref 0–44)
AST: 24 U/L (ref 15–41)
Anion gap: 12 (ref 5–15)
BUN: 11 mg/dL (ref 8–23)
CALCIUM: 9.4 mg/dL (ref 8.9–10.3)
CO2: 24 mmol/L (ref 22–32)
CREATININE: 0.75 mg/dL (ref 0.44–1.00)
Chloride: 105 mmol/L (ref 98–111)
GFR, Est AFR Am: >60 mL/min (ref >60)
GFR, Estimated: >60 mL/min (ref >60)
GLUCOSE: 126 mg/dL — AB (ref 70–99)
Potassium: 3.9 mmol/L (ref 3.5–5.1)
SODIUM: 141 mmol/L (ref 135–145)
Total Bilirubin: 0.6 mg/dL (ref 0.3–1.2)
Total Protein: 7.5 g/dL (ref 6.5–8.1)

## 2018-04-29 LAB — LACTATE DEHYDROGENASE: LDH: 143 U/L (ref 98–192)

## 2018-04-29 MED ORDER — HEPARIN SOD (PORK) LOCK FLUSH 100 UNIT/ML IV SOLN
500.0000 [IU] | Freq: Once | INTRAVENOUS | Status: AC
  Administered 2018-04-29: 500 [IU] via INTRAVENOUS
  Filled 2018-04-29: qty 5

## 2018-04-29 MED ORDER — SODIUM CHLORIDE 0.9% FLUSH
10.0000 mL | Freq: Once | INTRAVENOUS | Status: AC
  Administered 2018-04-29: 10 mL
  Filled 2018-04-29: qty 10

## 2018-04-29 MED ORDER — AMOXICILLIN-POT CLAVULANATE 875-125 MG PO TABS: 1.0000 | ORAL_TABLET | Freq: Two times a day (BID) | ORAL | 0 refills | Status: DC

## 2018-04-29 MED ORDER — SODIUM CHLORIDE 0.9% FLUSH
10.0000 mL | Freq: Once | INTRAVENOUS | Status: AC
  Administered 2018-04-29: 10 mL via INTRAVENOUS
  Filled 2018-04-29: qty 10

## 2018-04-29 NOTE — Telephone Encounter (Signed)
Scheduled appt per 8/7 los - gave patient AVS and calender per los. - referral sent thru [facility] .

## 2018-05-02 ENCOUNTER — Ambulatory Visit: Payer: BLUE CROSS/BLUE SHIELD | Admitting: Podiatry

## 2018-05-02 DIAGNOSIS — L03032 Cellulitis of left toe: Secondary | ICD-10-CM | POA: Diagnosis not present

## 2018-05-02 DIAGNOSIS — L600 Ingrowing nail: Secondary | ICD-10-CM | POA: Diagnosis not present

## 2018-05-02 NOTE — Patient Instructions (Signed)

## 2018-05-05 NOTE — Progress Notes (Signed)
   Subjective: Patient presents today for evaluation of pain to the medial border of the left great toe that began several weeks ago. She reports associated redness and swelling of the area. Patient is concerned for possible ingrown nail. Wearing shoes and applying pressure to the toe increases the pain. She has been treated with antibiotics and has used topical creams for treatment. Patient presents today for further treatment and evaluation.  Past Medical History:  Diagnosis Date  . Back pain   . Headache    migraines  . Shingles   . Swollen lymph nodes    left axilla, rectoperitonal     Objective:  General: Well developed, nourished, in no acute distress, alert and oriented x3   Dermatology: Skin is warm, dry and supple bilateral. Medial border left hallux appears to be erythematous with evidence of an ingrowing nail. Pain on palpation noted to the border of the nail fold. The remaining nails appear unremarkable at this time. There are no open sores, lesions.  Vascular: Dorsalis Pedis artery and Posterior Tibial artery pedal pulses palpable. No lower extremity edema noted.   Neruologic: Grossly intact via light touch bilateral.  Musculoskeletal: Muscular strength within normal limits in all groups bilateral. Normal range of motion noted to all pedal and ankle joints.   Assesement: #1 Paronychia with ingrowing nail medial border left hallux  #2 Pain in toe #3 Incurvated nail  Plan of Care:  1. Patient evaluated.  2. Discussed treatment alternatives and plan of care. Explained nail avulsion procedure and post procedure course to patient. 3. Patient opted for temporary partial nail avulsion.  4. Prior to procedure, local anesthesia infiltration utilized using 3 ml of a 50:50 mixture of 2% plain lidocaine and 0.5% plain marcaine in a normal hallux block fashion and a betadine prep performed.  5. Light dressing applied. 6. Continue using Mupirocin ointment daily.  7. Post op shoe  dispensed.  8. Return to clinic in 10 days.  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    Edrick Kins, DPM [facility]  Dr. Edrick Kins, [address]                                        [place], [place] [address]                Office [phone]  Fax [phone]

## 2018-05-11 ENCOUNTER — Ambulatory Visit: Payer: BLUE CROSS/BLUE SHIELD | Admitting: Podiatry

## 2018-05-28 ENCOUNTER — Inpatient Hospital Stay: Payer: BLUE CROSS/BLUE SHIELD

## 2018-05-28 ENCOUNTER — Inpatient Hospital Stay: Payer: BLUE CROSS/BLUE SHIELD | Attending: Hematology

## 2018-05-28 ENCOUNTER — Inpatient Hospital Stay (HOSPITAL_BASED_OUTPATIENT_CLINIC_OR_DEPARTMENT_OTHER): Payer: BLUE CROSS/BLUE SHIELD | Admitting: Hematology

## 2018-05-28 ENCOUNTER — Encounter: Payer: Self-pay | Admitting: Hematology

## 2018-05-28 VITALS — BP 153/77 | HR 97 | Temp 98.3°F | Resp 18 | Ht 63.0 in | Wt 196.5 lb

## 2018-05-28 VITALS — BP 137/71 | HR 75 | Temp 98.2°F | Resp 18

## 2018-05-28 DIAGNOSIS — Z95828 Presence of other vascular implants and grafts: Secondary | ICD-10-CM

## 2018-05-28 DIAGNOSIS — Z5112 Encounter for antineoplastic immunotherapy: Secondary | ICD-10-CM | POA: Insufficient documentation

## 2018-05-28 DIAGNOSIS — C8248 Follicular lymphoma grade IIIb, lymph nodes of multiple sites: Secondary | ICD-10-CM

## 2018-05-28 DIAGNOSIS — Z806 Family history of leukemia: Secondary | ICD-10-CM | POA: Diagnosis not present

## 2018-05-28 DIAGNOSIS — Z452 Encounter for adjustment and management of vascular access device: Secondary | ICD-10-CM | POA: Diagnosis not present

## 2018-05-28 DIAGNOSIS — M25511 Pain in right shoulder: Secondary | ICD-10-CM | POA: Diagnosis not present

## 2018-05-28 DIAGNOSIS — Z7189 Other specified counseling: Secondary | ICD-10-CM

## 2018-05-28 DIAGNOSIS — C8580 Other specified types of non-Hodgkin lymphoma, unspecified site: Secondary | ICD-10-CM

## 2018-05-28 DIAGNOSIS — Z8041 Family history of malignant neoplasm of ovary: Secondary | ICD-10-CM | POA: Diagnosis not present

## 2018-05-28 LAB — CMP (CANCER CENTER ONLY)
ALBUMIN: 4.1 g/dL (ref 3.5–5.0)
ALT: 20 U/L (ref 0–44)
AST: 20 U/L (ref 15–41)
Alkaline Phosphatase: 84 U/L (ref 38–126)
Anion gap: 9 (ref 5–15)
BUN: 11 mg/dL (ref 8–23)
CHLORIDE: 106 mmol/L (ref 98–111)
CO2: 26 mmol/L (ref 22–32)
CREATININE: 0.81 mg/dL (ref 0.44–1.00)
Calcium: 10 mg/dL (ref 8.9–10.3)
GFR, Estimated: >60 mL/min (ref >60)
Glucose, Bld: 142 mg/dL — ABNORMAL HIGH (ref 70–99)
Potassium: 4.3 mmol/L (ref 3.5–5.1)
SODIUM: 141 mmol/L (ref 135–145)
Total Bilirubin: 0.5 mg/dL (ref 0.3–1.2)
Total Protein: 7.6 g/dL (ref 6.5–8.1)

## 2018-05-28 LAB — RETICULOCYTES
RBC.: 4.33 MIL/uL (ref 3.70–5.45)
RETIC CT PCT: 1.5 % (ref 0.7–2.1)
Retic Count, Absolute: 65 10*3/uL (ref 33.7–90.7)

## 2018-05-28 LAB — CBC WITH DIFFERENTIAL (CANCER CENTER ONLY)
Basophils Absolute: 0 10*3/uL (ref 0.0–0.1)
Basophils Relative: 1 %
EOS ABS: 0.2 10*3/uL (ref 0.0–0.5)
Eosinophils Relative: 3 %
HCT: 38.5 % (ref 34.8–46.6)
HEMOGLOBIN: 13.4 g/dL (ref 11.6–15.9)
LYMPHS ABS: 1.3 10*3/uL (ref 0.9–3.3)
Lymphocytes Relative: 21 %
MCH: 30.9 pg (ref 25.1–34.0)
MCHC: 34.8 g/dL (ref 31.5–36.0)
MCV: 88.9 fL (ref 79.5–101.0)
MONOS PCT: 7 %
Monocytes Absolute: 0.4 10*3/uL (ref 0.1–0.9)
NEUTROS PCT: 68 %
Neutro Abs: 4.1 10*3/uL (ref 1.5–6.5)
Platelet Count: 248 10*3/uL (ref 145–400)
RBC: 4.33 MIL/uL (ref 3.70–5.45)
RDW: 12.9 % (ref 11.2–14.5)
WBC Count: 5.9 10*3/uL (ref 3.9–10.3)

## 2018-05-28 LAB — LACTATE DEHYDROGENASE: LDH: 131 U/L (ref 98–192)

## 2018-05-28 MED ORDER — HEPARIN SOD (PORK) LOCK FLUSH 100 UNIT/ML IV SOLN
500.0000 [IU] | Freq: Once | INTRAVENOUS | Status: AC | PRN
  Administered 2018-05-28: 500 [IU]
  Filled 2018-05-28: qty 5

## 2018-05-28 MED ORDER — SODIUM CHLORIDE 0.9 % IV SOLN
375.0000 mg/m2 | Freq: Once | INTRAVENOUS | Status: AC
  Administered 2018-05-28: 700 mg via INTRAVENOUS
  Filled 2018-05-28: qty 20

## 2018-05-28 MED ORDER — SODIUM CHLORIDE 0.9 % IV SOLN
Freq: Once | INTRAVENOUS | Status: AC
  Administered 2018-05-28: 13:00:00 via INTRAVENOUS
  Filled 2018-05-28: qty 250

## 2018-05-28 MED ORDER — SODIUM CHLORIDE 0.9% FLUSH
10.0000 mL | Freq: Once | INTRAVENOUS | Status: AC
  Administered 2018-05-28: 10 mL
  Filled 2018-05-28: qty 10

## 2018-05-28 MED ORDER — DIPHENHYDRAMINE HCL 25 MG PO CAPS
50.0000 mg | ORAL_CAPSULE | Freq: Once | ORAL | Status: AC
  Administered 2018-05-28: 50 mg via ORAL

## 2018-05-28 MED ORDER — ACETAMINOPHEN 325 MG PO TABS
ORAL_TABLET | ORAL | Status: AC
  Filled 2018-05-28: qty 2

## 2018-05-28 MED ORDER — ACETAMINOPHEN 325 MG PO TABS
650.0000 mg | ORAL_TABLET | Freq: Once | ORAL | Status: AC
  Administered 2018-05-28: 650 mg via ORAL

## 2018-05-28 MED ORDER — DIPHENHYDRAMINE HCL 25 MG PO CAPS
ORAL_CAPSULE | ORAL | Status: AC
  Filled 2018-05-28: qty 2

## 2018-05-28 MED ORDER — SODIUM CHLORIDE 0.9% FLUSH
10.0000 mL | INTRAVENOUS | Status: DC | PRN
  Administered 2018-05-28: 10 mL
  Filled 2018-05-28: qty 10

## 2018-05-28 MED ORDER — ALTEPLASE 2 MG IJ SOLR
2.0000 mg | Freq: Once | INTRAMUSCULAR | Status: AC
  Administered 2018-05-28: 2 mg
  Filled 2018-05-28: qty 2

## 2018-05-28 NOTE — Progress Notes (Signed)
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 female who has been referred to [place] by Dr .Irene Limbo, [place]oria [place], MD for evaluation and management of suspected non-Hodgkin's lymphoma.  Patient notes that she presented with back pain for 4-6 weeks and eventually had pain radiating to her right inguinal area. She notes that she has had interstitial cystitis in the past. She was thought to have a urinary tract infection and was treated with Macrodantin. Says the pain got worse and started radiating into the right groin she had a CT of the abdomen renal stone protocol on 10/25/2016 which showed extensive retroperitoneal lymphadenopathy concerning for lymphoproliferative disorder. She was incidentally noted to have a nodular border of the liver with possible liver cirrhosis. No urinary stones noted.  She subsequently had a PET CT scan ordered by her primary care physician which was done on 11/07/2016. This showed diffuse hypermetabolic spleen with mild splenomegaly. Hypermetabolic adenopathy in the left supraclavicular, bilateral axillary, peripancreatic, gastrohepatic ligament, celiac, porta hepatis, periaortic, and left external iliac chains. Hypermetabolic mesenteric lymph nodes. Appearance favors lymphoma.  Patient notes some night sweats for 2 weeks. Has lost about 10 pounds in the last month. No acute fevers or chills. Notes the back pain is controlled with when necessary Vicodin.  INTERVAL HISTORY  Katelyn Reeves is here for her scheduled follow-up regarding her follicular lymphoma and sixth maintenance dose of Rituxan. The patient's last visit with [place] was on 04/29/18. The pt reports that she is doing well overall.   The  pt reports that her URI cleared as did her cellulitis on her foot after seeing her pediatrist and finishing her course of topical antibiotics. She notes that her bowel movements have been normal and regular. She endorses stable energy levels and denies any constitutional symptoms.   Lab results today (05/28/18) of CBC w/diff, CMP, and Reticulocytes is as follows: all values are WNL except for Glucose at 142.05/28/18 LDH is WNL at 131  On review of systems, pt reports infection resolution, normal and regular bowel movements, and denies fevers, chills, night sweats, current concerns for infections, and any other symptoms.    MEDICAL HISTORY:  #1 fibrocystic disease of the breast #2 hypertension #3 constipation #4 duplicated right sided ureters #5 imaging concern for possible liver cirrhosis  SURGICAL HISTORY:  #1 total abdominal hysterectomy with bilateral salpingo-oophorectomy for ovarian cyst. #2 Lipoma removal left upper back #3 hemorrhoidectomy  SOCIAL HISTORY: Social History   Socioeconomic History  . Marital status: Divorced    Spouse name: Not on file  . Number of children: Not on file  . Years of education: Not on file  . Highest education level: Not on file  Occupational History  . Not on file  Social Needs  . Financial resource strain: Not on file  . Food insecurity:    Worry: Not on file    Inability: Not on file  . Transportation needs:    Medical: Not on file    Non-medical: Not on file  Tobacco Use  . Smoking status: Never Smoker  . Smokeless tobacco: Never Used  Substance and Sexual Activity  . Alcohol use: No  . Drug use: No  . Sexual activity: Not on file  Lifestyle  . Physical activity:    Days per week: Not on file    Minutes per session: Not on file  . Stress: Not on file  Relationships  . Social connections:    Talks on phone: Not on file    Gets together: Not on file    Attends religious service: Not on file    Active member of club or  organization: Not on file    Attends meetings of clubs or organizations: Not on file    Relationship status: Not on file  . Intimate partner violence:    Fear of current or ex partner: Not on file    Emotionally abused: Not on file    Physically abused: Not on file    Forced sexual activity: Not on file  Other Topics Concern  . Not on file  Social History Narrative  . Not on file  Nonsmoker no issues with alcohol use or drug use. 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   FAMILY HISTORY:  Notes her mother had ovarian cancer followed by leukemia and died at age 31 years Maternal aunt gastric cancer  ALLERGIES:  is allergic to sulfa antibiotics.  MEDICATIONS:  Current Outpatient Medications  Medication Sig Dispense Refill  . lidocaine-prilocaine (EMLA) cream Apply to port-a-cath 1 hr prior to treatment/lab draw 30 g 0  . HYDROcodone-acetaminophen (NORCO/VICODIN) 5-325 MG tablet as needed.  0  . traMADol (ULTRAM) 50 MG tablet Take 1 tablet (50 mg total) by mouth every 6 (six) hours as needed. (Patient not taking: Reported on 05/28/2018) 30 tablet 0   No current facility-administered medications for this visit.    Facility-Administered Medications Ordered in Other Visits  Medication Dose Route Frequency Provider Last Rate Last Dose  . sodium chloride flush (NS) 0.9 % injection 10 mL  10 mL Intracatheter PRN Brunetta Genera, MD   10 mL at 07/04/17 1540    REVIEW OF SYSTEMS:    A 10+ POINT REVIEW OF SYSTEMS WAS OBTAINED including neurology, dermatology, psychiatry, cardiac, respiratory, lymph, extremities, GI, GU, Musculoskeletal, constitutional, breasts, reproductive, HEENT.  All pertinent positives are noted in the HPI.  All others are negative.   PHYSICAL EXAMINATION: ECOG PERFORMANCE STATUS: 1 - Symptomatic but completely ambulatory  . Vitals:   05/28/18 1147  BP: (!) 153/77  Pulse: 97  Resp: 18  Temp: 98.3 F (36.8 C)    SpO2: (!) 70%   Filed Weights   05/28/18 1147  Weight: 196 lb 8 oz (89.1 kg)   .Body mass index is 34.81 kg/m.  GENERAL:alert, in no acute distress and comfortable SKIN: no acute rashes, no significant lesions EYES: conjunctiva are pink and non-injected, sclera anicteric OROPHARYNX: MMM, no exudates, no oropharyngeal erythema or ulceration NECK: supple, no JVD LYMPH:  no palpable lymphadenopathy in the cervical, axillary or inguinal regions LUNGS: clear to auscultation b/l with normal respiratory effort HEART: regular rate & rhythm ABDOMEN:  normoactive bowel sounds , non tender, not distended. No palpable hepatosplenomegaly.  Extremity: no pedal edema PSYCH: alert & oriented x 3 with fluent speech NEURO: no focal motor/sensory deficits   LABORATORY DATA:  I have reviewed the data as listed  . CBC Latest Ref Rng & Units 05/28/2018 04/29/2018 02/27/2018  WBC 3.9 - 10.3 K/uL 5.9 5.4 5.3  Hemoglobin 11.6 - 15.9 g/dL 13.4 12.4 12.1  Hematocrit 34.8 - 46.6 % 38.5 36.3 35.3  Platelets 145 - 400 K/uL 248 231 203   CBC  Component Value Date/Time   WBC 5.9 05/28/2018 1054   WBC 5.4 04/29/2018 0828   RBC 4.33 05/28/2018 1054   RBC 4.33 05/28/2018 1054   HGB 13.4 05/28/2018 1054   HGB 12.6 09/04/2017 1006   HCT 38.5 05/28/2018 1054   HCT 36.6 09/04/2017 1006   PLT 248 05/28/2018 1054   PLT 222 09/04/2017 1006   MCV 88.9 05/28/2018 1054   MCV 88.4 09/04/2017 1006   MCH 30.9 05/28/2018 1054   MCHC 34.8 05/28/2018 1054   RDW 12.9 05/28/2018 1054   RDW 13.1 09/04/2017 1006   LYMPHSABS 1.3 05/28/2018 1054   LYMPHSABS 1.1 09/04/2017 1006   MONOABS 0.4 05/28/2018 1054   MONOABS 0.3 09/04/2017 1006   EOSABS 0.2 05/28/2018 1054   EOSABS 0.2 09/04/2017 1006   BASOSABS 0.0 05/28/2018 1054   BASOSABS 0.0 09/04/2017 1006   . CMP Latest Ref Rng & Units 05/28/2018 04/29/2018 02/27/2018  Glucose 70 - 99 mg/dL 142(H) 126(H) 138  BUN 8 - 23 mg/dL 11 11 11   Creatinine 0.44 - 1.00 mg/dL 0.81  0.75 0.79  Sodium 135 - 145 mmol/L 141 141 140  Potassium 3.5 - 5.1 mmol/L 4.3 3.9 4.0  Chloride 98 - 111 mmol/L 106 105 105  CO2 22 - 32 mmol/L 26 24 24   Calcium 8.9 - 10.3 mg/dL 10.0 9.4 9.7  Total Protein 6.5 - 8.1 g/dL 7.6 7.5 7.2  Total Bilirubin 0.3 - 1.2 mg/dL 0.5 0.6 0.4  Alkaline Phos 38 - 126 U/L 84 75 77  AST 15 - 41 U/L 20 24 20   ALT 0 - 44 U/L 20 25 13    . Lab Results  Component Value Date   LDH 131 05/28/2018   Component     Latest Ref Rng & Units 11/12/2016  Hep C Virus Ab     0.0 - 0.9 s/co ratio <0.1  Hepatitis B Surface Ag     Negative Negative  Hep B Core Ab, Tot     Negative Negative       RADIOGRAPHIC STUDIES: I have personally reviewed the radiological images as listed and agreed with the findings in the report. No results found.  ASSESSMENT & PLAN:   67 y.o. 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  Presented with Generalized FDG avid lymphadenopathy - predominantly in the abdomen/Retroperitoneum. Also noted to have left supraclavicular and bilateral axillary left more than right FDG avid lymphadenopathy. Based on imaging this would represent at least Stage III disease if this were a lymphoma. LDH level is not significantly elevated. Blood counts are stable. PET/CT scan does show fairly active disease which is FDG avid LNadenopathy and FDG avid splenomegaly. Patient has some constitutional symptoms with about a 10 pound weight loss and some night sweats. Hepatitis profile negative. No other obvious new focal symptoms.  Patient has had an ECHO which shows nl EF  Patient is status post 6 cycles of R CHOP with no prohibitive toxicities.  PET/CT on 8/72018 - showed complete metabolic response with no metabolically active disease. A few borderline lymph nodes in the retroperitoneum.  CT chest/abd/pelvis  11/04/2017:  No new or progressive findings to suggest recurrent disease. The small abdominal retroperitoneal lymph nodes are unchanged when comparing to PET-CT of 04/29/2017.   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 Improved with massage therapy  #4 Recent resolved mild shingles  #5 Recent resolved parotitis  PLAN:   -The pt shows no clinical or lab progression of her follicular lymphoma at this time -Discussed pt labwork today, 05/28/18; blood counts and chemistries are stable, LDH is normal -Will refill Emla cream -The pt has no prohibitive toxicities from continuing maintenance Rituxan at this time. -Discussed port removal again with pt to avoid risks of infections and blood clots - pt would like to continue to think about this -Will see the pt back in 2 months    -Please schedule next dose of maintenance Rituxan in 60days with labs, MD visit and port flush -cancel port flushes at [facility] and schedule them instead with each lab with Rituxan.   All of the patients questions were answered with apparent satisfaction. The patient knows to call the clinic with any problems, questions or concerns.  The total time spent in the appt was 20 minutes and more than 50% was on counseling and direct patient cares.    Sullivan Lone MD Katelyn AAHIVMS [facility] Hematology/Oncology Physician [facility]  (Office):       [phone] (Work cell):  [phone] (Fax):           [phone]  I, Baldwin Jamaica, am acting as a scribe for Dr. Irene Limbo  .I have reviewed the above documentation for accuracy and completeness, and I agree with the above. Brunetta Genera MD

## 2018-05-28 NOTE — Patient Instructions (Signed)
[facility] Discharge Instructions for Patients Receiving Chemotherapy  Today you received the following chemotherapy agents: Rituxan.  To help prevent nausea and vomiting after your treatment, we encourage you to take your nausea medication as directed  If you develop nausea and vomiting that is not controlled by your nausea medication, call the clinic.   BELOW ARE SYMPTOMS THAT SHOULD BE REPORTED IMMEDIATELY:  *FEVER GREATER THAN 100.5 F  *CHILLS WITH OR WITHOUT FEVER  NAUSEA AND VOMITING THAT IS NOT CONTROLLED WITH YOUR NAUSEA MEDICATION  *UNUSUAL SHORTNESS OF BREATH  *UNUSUAL BRUISING OR BLEEDING  TENDERNESS IN MOUTH AND THROAT WITH OR WITHOUT PRESENCE OF ULCERS  *URINARY PROBLEMS  *BOWEL PROBLEMS  UNUSUAL RASH Items with * indicate a potential emergency and should be followed up as soon as possible.  Feel free to call the clinic should you have any questions or concerns. The clinic phone number is [phone].  Please show the [facility] at check-in to the Emergency Department and triage nurse.

## 2018-05-29 ENCOUNTER — Telehealth: Payer: Self-pay

## 2018-05-29 NOTE — Telephone Encounter (Signed)
Spoke with patient concerning her upcoming appointment. Per 9/5 los also mailed a letter with a calender enclosed

## 2018-06-12 ENCOUNTER — Encounter (HOSPITAL_COMMUNITY): Payer: BLUE CROSS/BLUE SHIELD

## 2018-06-16 ENCOUNTER — Other Ambulatory Visit: Payer: Self-pay

## 2018-06-16 DIAGNOSIS — Z95828 Presence of other vascular implants and grafts: Secondary | ICD-10-CM

## 2018-06-16 MED ORDER — HEPARIN SOD (PORK) LOCK FLUSH 100 UNIT/ML IV SOLN
500.0000 [IU] | Freq: Once | INTRAVENOUS | Status: DC
  Filled 2018-06-16: qty 5

## 2018-06-16 MED ORDER — SODIUM CHLORIDE 0.9% FLUSH
10.0000 mL | Freq: Once | INTRAVENOUS | Status: DC
  Filled 2018-06-16: qty 10

## 2018-06-24 ENCOUNTER — Ambulatory Visit: Payer: BLUE CROSS/BLUE SHIELD | Admitting: Hematology

## 2018-06-24 ENCOUNTER — Other Ambulatory Visit: Payer: BLUE CROSS/BLUE SHIELD

## 2018-06-24 ENCOUNTER — Ambulatory Visit: Payer: BLUE CROSS/BLUE SHIELD

## 2018-07-03 ENCOUNTER — Encounter (HOSPITAL_COMMUNITY): Payer: BLUE CROSS/BLUE SHIELD

## 2018-07-09 ENCOUNTER — Other Ambulatory Visit (HOSPITAL_COMMUNITY): Payer: Self-pay | Admitting: Internal Medicine

## 2018-07-09 DIAGNOSIS — Z1231 Encounter for screening mammogram for malignant neoplasm of breast: Secondary | ICD-10-CM

## 2018-07-20 ENCOUNTER — Ambulatory Visit (HOSPITAL_COMMUNITY): Payer: Medicare Other

## 2018-07-28 ENCOUNTER — Other Ambulatory Visit: Payer: BLUE CROSS/BLUE SHIELD

## 2018-07-28 ENCOUNTER — Ambulatory Visit: Payer: BLUE CROSS/BLUE SHIELD

## 2018-07-28 ENCOUNTER — Ambulatory Visit: Payer: BLUE CROSS/BLUE SHIELD | Admitting: Hematology

## 2018-07-29 ENCOUNTER — Telehealth: Payer: Self-pay | Admitting: Hematology

## 2018-07-29 NOTE — Telephone Encounter (Signed)
GK out 11/8 - per [place] moved appointments two weeks out. Spoke with patient re appointments 11/22.

## 2018-07-31 ENCOUNTER — Other Ambulatory Visit: Payer: Medicare Other

## 2018-07-31 ENCOUNTER — Ambulatory Visit: Payer: Self-pay | Admitting: Hematology

## 2018-07-31 ENCOUNTER — Ambulatory Visit: Payer: Self-pay

## 2018-07-31 ENCOUNTER — Other Ambulatory Visit: Payer: Self-pay

## 2018-08-03 ENCOUNTER — Ambulatory Visit (HOSPITAL_COMMUNITY)
Admission: RE | Admit: 2018-08-03 | Discharge: 2018-08-03 | Disposition: A | Discharge status: Home / Self Care | Payer: Medicare Other | Source: Ambulatory Visit | Attending: Internal Medicine | Admitting: Internal Medicine

## 2018-08-03 DIAGNOSIS — Z1231 Encounter for screening mammogram for malignant neoplasm of breast: Secondary | ICD-10-CM | POA: Diagnosis not present

## 2018-08-13 NOTE — Progress Notes (Signed)
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 female who has been referred to [place] by Dr .Irene Limbo, Cloria Spring, MD for evaluation and management of suspected non-Hodgkin's lymphoma.  Patient notes that she presented with back pain for 4-6 weeks and eventually had pain radiating to her right inguinal area. She notes that she has had interstitial cystitis in the past. She was thought to have a urinary tract infection and was treated with Macrodantin. Says the pain got worse and started radiating into the right groin she had a CT of the abdomen renal stone protocol on 10/25/2016 which showed extensive retroperitoneal lymphadenopathy concerning for lymphoproliferative disorder. She was incidentally noted to have a nodular border of the liver with possible liver cirrhosis. No urinary stones noted.  She subsequently had a PET CT scan ordered by her primary care physician which was done on 11/07/2016. This showed diffuse hypermetabolic spleen with mild splenomegaly. Hypermetabolic adenopathy in the left supraclavicular, bilateral axillary, peripancreatic, gastrohepatic ligament, celiac, porta hepatis, periaortic, and left external iliac chains. Hypermetabolic mesenteric lymph nodes. Appearance favors lymphoma.  Patient notes some night sweats for 2 weeks. Has lost about 10 pounds in the last month. No acute fevers or chills. Notes the back pain is controlled with when necessary Vicodin.  INTERVAL HISTORY  Ms Lagares is here for her scheduled follow-up regarding her follicular lymphoma and sixth maintenance dose of Rituxan. The patient's last visit with [place] was on 05/28/18. The pt reports that she is doing well overall and is  enjoying her recent retirement.   The pt reports that she has had some mild back pain which she develops at night and denies this limiting her in any way, treats with over the counter medications successfully. She notes that this pain is nothing like the pain she had at initial presentation.   The pt notes that she has been enjoying very good energy levels and has been staying active in her recent retirement.   Lab results today (08/14/18) of CBC w/diff, CMP, and Reticulocytes is as follows: all values are WNL except for Glucose at 120. 08/14/18 LDH is normal at 163  On review of systems, pt reports good energy levels, eating well, good appetite, and denies fevers, chills, night sweats, noticing any new lumps or bumps, abdominal pains, leg swelling, lower abdominal pains, and any other symptoms.    MEDICAL HISTORY:  #1 fibrocystic disease of the breast #2 hypertension #3 constipation #4 duplicated right sided ureters #5 imaging concern for possible liver cirrhosis  SURGICAL HISTORY:  #1 total abdominal hysterectomy with bilateral salpingo-oophorectomy for ovarian cyst. #2 Lipoma removal left upper back #3 hemorrhoidectomy  SOCIAL HISTORY: Social History   Socioeconomic History  . Marital status: Divorced    Spouse name: Not on file  . Number of children: Not on file  . Years of education: Not on file  . Highest education level: Not on file  Occupational History  . Not on file  Social Needs  . Financial resource strain: Not on file  . Food insecurity:    Worry: Not on file    Inability: Not on file  . Transportation needs:    Medical: Not on file    Non-medical: Not  on file  Tobacco Use  . Smoking status: Never Smoker  . Smokeless tobacco: Never Used  Substance and Sexual Activity  . Alcohol use: No  . Drug use: No  . Sexual activity: Not on file  Lifestyle  . Physical activity:    Days per week: Not on file    Minutes per session: Not on file  . Stress: Not on  file  Relationships  . Social connections:    Talks on phone: Not on file    Gets together: Not on file    Attends religious service: Not on file    Active member of club or organization: Not on file    Attends meetings of clubs or organizations: Not on file    Relationship status: Not on file  . Intimate partner violence:    Fear of current or ex partner: Not on file    Emotionally abused: Not on file    Physically abused: Not on file    Forced sexual activity: Not on file  Other Topics Concern  . Not on file  Social History Narrative  . Not on file  Nonsmoker no issues with alcohol use or drug use. 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   FAMILY HISTORY:  Notes her mother had ovarian cancer followed by leukemia and died at age 18 years Maternal aunt gastric cancer  ALLERGIES:  is allergic to sulfa antibiotics.  MEDICATIONS:  Current Outpatient Medications  Medication Sig Dispense Refill  . HYDROcodone-acetaminophen (NORCO/VICODIN) 5-325 MG tablet as needed.  0  . lidocaine-prilocaine (EMLA) cream Apply to port-a-cath 1 hr prior to treatment/lab draw 30 g 0  . traMADol (ULTRAM) 50 MG tablet Take 1 tablet (50 mg total) by mouth every 6 (six) hours as needed. (Patient not taking: Reported on 05/28/2018) 30 tablet 0   No current facility-administered medications for this visit.    Facility-Administered Medications Ordered in Other Visits  Medication Dose Route Frequency Provider Last Rate Last Dose  . sodium chloride flush (NS) 0.9 % injection 10 mL  10 mL Intracatheter PRN Brunetta Genera, MD   10 mL at 07/04/17 1540    REVIEW OF SYSTEMS:    A 10+ POINT REVIEW OF SYSTEMS WAS OBTAINED including neurology, dermatology, psychiatry, cardiac, respiratory, lymph, extremities, GI, GU, Musculoskeletal, constitutional, breasts, reproductive, HEENT.  All pertinent positives are noted in the HPI.  All others are negative.   PHYSICAL  EXAMINATION: ECOG PERFORMANCE STATUS: 1 - Symptomatic but completely ambulatory  . Vitals:   08/14/18 1132  BP: (!) 146/68  Pulse: 72  Resp: 18  Temp: 98 F (36.7 C)  SpO2: 99%   Filed Weights   08/14/18 1132  Weight: 196 lb 12.8 oz (89.3 kg)   .Body mass index is 34.86 kg/m.  GENERAL:alert, in no acute distress and comfortable SKIN: no acute rashes, no significant lesions EYES: conjunctiva are pink and non-injected, sclera anicteric OROPHARYNX: MMM, no exudates, no oropharyngeal erythema or ulceration NECK: supple, no JVD LYMPH:  no palpable lymphadenopathy in the cervical, axillary or inguinal regions LUNGS: clear to auscultation b/l with normal respiratory effort HEART: regular rate & rhythm ABDOMEN:  normoactive bowel sounds , non tender, not distended. No palpable hepatosplenomegaly.  Extremity: no pedal edema PSYCH: alert & oriented x 3 with fluent speech NEURO: no focal motor/sensory deficits   LABORATORY DATA:  I have reviewed the data as listed  . CBC Latest Ref Rng & Units 08/14/2018 05/28/2018 04/29/2018  WBC 4.0 - 10.5 K/uL 5.4 5.9 5.4  Hemoglobin 12.0 - 15.0 g/dL 12.6 13.4 12.4  Hematocrit 36.0 - 46.0 % 36.4 38.5 36.3  Platelets 150 - 400 K/uL 222 248 231   CBC    Component Value Date/Time   WBC 5.4 08/14/2018 1015   RBC 4.10 08/14/2018 1015   RBC 4.10 08/14/2018 1015   HGB 12.6 08/14/2018 1015   HGB 13.4 05/28/2018 1054   HGB 12.6 09/04/2017 1006   HCT 36.4 08/14/2018 1015   HCT 36.6 09/04/2017 1006   PLT 222 08/14/2018 1015   PLT 248 05/28/2018 1054   PLT 222 09/04/2017 1006   MCV 88.8 08/14/2018 1015   MCV 88.4 09/04/2017 1006   MCH 30.7 08/14/2018 1015   MCHC 34.6 08/14/2018 1015   RDW 12.3 08/14/2018 1015   RDW 13.1 09/04/2017 1006   LYMPHSABS 1.1 08/14/2018 1015   LYMPHSABS 1.1 09/04/2017 1006   MONOABS 0.4 08/14/2018 1015   MONOABS 0.3 09/04/2017 1006   EOSABS 0.2 08/14/2018 1015   EOSABS 0.2 09/04/2017 1006   BASOSABS 0.0  08/14/2018 1015   BASOSABS 0.0 09/04/2017 1006   . CMP Latest Ref Rng & Units 08/14/2018 05/28/2018 04/29/2018  Glucose 70 - 99 mg/dL 120(H) 142(H) 126(H)  BUN 8 - 23 mg/dL 12 11 11   Creatinine 0.44 - 1.00 mg/dL 0.83 0.81 0.75  Sodium 135 - 145 mmol/L 142 141 141  Potassium 3.5 - 5.1 mmol/L 4.1 4.3 3.9  Chloride 98 - 111 mmol/L 108 106 105  CO2 22 - 32 mmol/L 24 26 24   Calcium 8.9 - 10.3 mg/dL 9.5 10.0 9.4  Total Protein 6.5 - 8.1 g/dL 7.3 7.6 7.5  Total Bilirubin 0.3 - 1.2 mg/dL 0.5 0.5 0.6  Alkaline Phos 38 - 126 U/L 76 84 75  AST 15 - 41 U/L 21 20 24   ALT 0 - 44 U/L 17 20 25    . Lab Results  Component Value Date   LDH 163 08/14/2018   Component     Latest Ref Rng & Units 11/12/2016  Hep C Virus Ab     0.0 - 0.9 s/co ratio <0.1  Hepatitis B Surface Ag     Negative Negative  Hep B Core Ab, Tot     Negative Negative       RADIOGRAPHIC STUDIES: I have personally reviewed the radiological images as listed and agreed with the findings in the report. Mm 3d Screen Breast Bilateral  Result Date: 08/04/2018 CLINICAL DATA:  Screening. EXAM: DIGITAL SCREENING BILATERAL MAMMOGRAM WITH TOMO AND CAD COMPARISON:  Previous exam(s). ACR Breast Density Category b: There are scattered areas of fibroglandular density. FINDINGS: There are no findings suspicious for malignancy. Images were processed with CAD. IMPRESSION: No mammographic evidence of malignancy. A result letter of this screening mammogram will be mailed directly to the patient. RECOMMENDATION: Screening mammogram in one year. (Code:SM-B-01Y) BI-RADS CATEGORY  1: Negative. Electronically Signed   By: Nolon Nations M.D.   On: 08/04/2018 09:17    ASSESSMENT & PLAN:   67 y.o. 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  Presented with Generalized FDG avid lymphadenopathy - predominantly  in the abdomen/Retroperitoneum. Also noted to have left supraclavicular and bilateral axillary left more than right FDG avid lymphadenopathy. Based on imaging this would represent at least Stage III disease if this were  a lymphoma. LDH level is not significantly elevated. Blood counts are stable. PET/CT scan does show fairly active disease which is FDG avid LNadenopathy and FDG avid splenomegaly. Patient has some constitutional symptoms with about a 10 pound weight loss and some night sweats. Hepatitis profile negative. No other obvious new focal symptoms.  Patient has had an ECHO which shows nl EF  Patient is status post 6 cycles of R CHOP with no prohibitive toxicities.  PET/CT on 8/72018 - showed complete metabolic response with no metabolically active disease. A few borderline lymph nodes in the retroperitoneum.  CT chest/abd/pelvis 11/04/2017:  No new or progressive findings to suggest recurrent disease. The small abdominal retroperitoneal lymph nodes are unchanged when comparing to PET-CT of 04/29/2017.   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 Improved with massage therapy  #4 Recent resolved mild shingles  #5 Recent resolved parotitis  PLAN:  -Discussed port removal again with pt to avoid risks of infections and blood clots - pt would like to continue to think about this -Discussed pt labwork today, 08/14/18; blood counts are all normal, chemistries are stale, LDH is normal at 163 -Flu shot today  -The pt has no prohibitive toxicities from continuing maintenance Rituxan every 2 monts at this time.  -The pt shows no clinical or lab progression/return of Follicular lymphoma at this time.  -Will see the pt back in 2 months     Please schedule next 2 doses of maintenance Rituxan q60days (as per orders) with labs and MD visits   All of the patients questions were answered with apparent satisfaction. The  patient knows to call the clinic with any problems, questions or concerns.  The total time spent in the appt was 25 minutes and more than 50% was on counseling and direct patient cares.    Sullivan Lone MD [place] AAHIVMS [facility] Hematology/Oncology Physician [facility]  (Office):       [phone] (Work cell):  [phone] (Fax):           [phone]  I, Baldwin Jamaica, am acting as a scribe for Dr. Sullivan Lone.   .I have reviewed the above documentation for accuracy and completeness, and I agree with the above. Brunetta Genera MD

## 2018-08-14 ENCOUNTER — Inpatient Hospital Stay: Payer: Medicare Other | Attending: Hematology

## 2018-08-14 ENCOUNTER — Telehealth: Payer: Self-pay | Admitting: Hematology

## 2018-08-14 ENCOUNTER — Inpatient Hospital Stay (HOSPITAL_BASED_OUTPATIENT_CLINIC_OR_DEPARTMENT_OTHER): Payer: Medicare Other | Admitting: Hematology

## 2018-08-14 ENCOUNTER — Inpatient Hospital Stay: Payer: Medicare Other

## 2018-08-14 VITALS — BP 131/78 | HR 64 | Temp 98.1°F | Resp 18

## 2018-08-14 VITALS — BP 146/68 | HR 72 | Temp 98.0°F | Resp 18 | Ht 63.0 in | Wt 196.8 lb

## 2018-08-14 DIAGNOSIS — Z5112 Encounter for antineoplastic immunotherapy: Secondary | ICD-10-CM

## 2018-08-14 DIAGNOSIS — C8248 Follicular lymphoma grade IIIb, lymph nodes of multiple sites: Secondary | ICD-10-CM | POA: Diagnosis not present

## 2018-08-14 DIAGNOSIS — Z79899 Other long term (current) drug therapy: Secondary | ICD-10-CM

## 2018-08-14 DIAGNOSIS — Z90722 Acquired absence of ovaries, bilateral: Secondary | ICD-10-CM | POA: Diagnosis not present

## 2018-08-14 DIAGNOSIS — Z90710 Acquired absence of both cervix and uterus: Secondary | ICD-10-CM | POA: Diagnosis not present

## 2018-08-14 DIAGNOSIS — Z95828 Presence of other vascular implants and grafts: Secondary | ICD-10-CM

## 2018-08-14 DIAGNOSIS — Z7189 Other specified counseling: Secondary | ICD-10-CM

## 2018-08-14 DIAGNOSIS — I10 Essential (primary) hypertension: Secondary | ICD-10-CM | POA: Diagnosis not present

## 2018-08-14 DIAGNOSIS — Z9079 Acquired absence of other genital organ(s): Secondary | ICD-10-CM | POA: Diagnosis not present

## 2018-08-14 DIAGNOSIS — C8580 Other specified types of non-Hodgkin lymphoma, unspecified site: Secondary | ICD-10-CM

## 2018-08-14 LAB — RETICULOCYTES
IMMATURE RETIC FRACT: 7.7 % (ref 2.3–15.9)
RBC.: 4.1 MIL/uL (ref 3.87–5.11)
RETIC COUNT ABSOLUTE: 62.7 10*3/uL (ref 19.0–186.0)
Retic Ct Pct: 1.5 % (ref 0.4–3.1)

## 2018-08-14 LAB — CMP (CANCER CENTER ONLY)
ALBUMIN: 4.1 g/dL (ref 3.5–5.0)
ALK PHOS: 76 U/L (ref 38–126)
ALT: 17 U/L (ref 0–44)
ANION GAP: 10 (ref 5–15)
AST: 21 U/L (ref 15–41)
BUN: 12 mg/dL (ref 8–23)
CALCIUM: 9.5 mg/dL (ref 8.9–10.3)
CO2: 24 mmol/L (ref 22–32)
Chloride: 108 mmol/L (ref 98–111)
Creatinine: 0.83 mg/dL (ref 0.44–1.00)
GFR, Est AFR Am: >60 mL/min (ref >60)
GFR, Estimated: >60 mL/min (ref >60)
GLUCOSE: 120 mg/dL — AB (ref 70–99)
POTASSIUM: 4.1 mmol/L (ref 3.5–5.1)
SODIUM: 142 mmol/L (ref 135–145)
Total Bilirubin: 0.5 mg/dL (ref 0.3–1.2)
Total Protein: 7.3 g/dL (ref 6.5–8.1)

## 2018-08-14 LAB — CBC WITH DIFFERENTIAL/PLATELET
Abs Immature Granulocytes: 0.02 10*3/uL (ref 0.00–0.07)
BASOS ABS: 0 10*3/uL (ref 0.0–0.1)
Basophils Relative: 1 %
Eosinophils Absolute: 0.2 10*3/uL (ref 0.0–0.5)
Eosinophils Relative: 4 %
HEMATOCRIT: 36.4 % (ref 36.0–46.0)
HEMOGLOBIN: 12.6 g/dL (ref 12.0–15.0)
IMMATURE GRANULOCYTES: 0 %
LYMPHS ABS: 1.1 10*3/uL (ref 0.7–4.0)
LYMPHS PCT: 20 %
MCH: 30.7 pg (ref 26.0–34.0)
MCHC: 34.6 g/dL (ref 30.0–36.0)
MCV: 88.8 fL (ref 80.0–100.0)
Monocytes Absolute: 0.4 10*3/uL (ref 0.1–1.0)
Monocytes Relative: 7 %
NEUTROS PCT: 68 %
NRBC: 0 % (ref 0.0–0.2)
Neutro Abs: 3.7 10*3/uL (ref 1.7–7.7)
Platelets: 222 10*3/uL (ref 150–400)
RBC: 4.1 MIL/uL (ref 3.87–5.11)
RDW: 12.3 % (ref 11.5–15.5)
WBC: 5.4 10*3/uL (ref 4.0–10.5)

## 2018-08-14 LAB — LACTATE DEHYDROGENASE: LDH: 163 U/L (ref 98–192)

## 2018-08-14 MED ORDER — ACETAMINOPHEN 325 MG PO TABS
ORAL_TABLET | ORAL | Status: AC
  Filled 2018-08-14: qty 2

## 2018-08-14 MED ORDER — DIPHENHYDRAMINE HCL 25 MG PO CAPS
50.0000 mg | ORAL_CAPSULE | Freq: Once | ORAL | Status: AC
  Administered 2018-08-14: 50 mg via ORAL

## 2018-08-14 MED ORDER — SODIUM CHLORIDE 0.9 % IV SOLN
Freq: Once | INTRAVENOUS | Status: AC
  Administered 2018-08-14: 13:00:00 via INTRAVENOUS
  Filled 2018-08-14: qty 250

## 2018-08-14 MED ORDER — SODIUM CHLORIDE 0.9 % IV SOLN
375.0000 mg/m2 | Freq: Once | INTRAVENOUS | Status: AC
  Administered 2018-08-14: 700 mg via INTRAVENOUS
  Filled 2018-08-14: qty 20

## 2018-08-14 MED ORDER — SODIUM CHLORIDE 0.9% FLUSH
10.0000 mL | INTRAVENOUS | Status: DC | PRN
  Administered 2018-08-14: 10 mL
  Filled 2018-08-14: qty 10

## 2018-08-14 MED ORDER — ACETAMINOPHEN 325 MG PO TABS
650.0000 mg | ORAL_TABLET | Freq: Once | ORAL | Status: AC
  Administered 2018-08-14: 650 mg via ORAL

## 2018-08-14 MED ORDER — ALTEPLASE 2 MG IJ SOLR
INTRAMUSCULAR | Status: AC
  Filled 2018-08-14: qty 2

## 2018-08-14 MED ORDER — ALTEPLASE 2 MG IJ SOLR
2.0000 mg | Freq: Once | INTRAMUSCULAR | Status: AC
  Administered 2018-08-14: 2 mg
  Filled 2018-08-14: qty 2

## 2018-08-14 MED ORDER — HEPARIN SOD (PORK) LOCK FLUSH 100 UNIT/ML IV SOLN
500.0000 [IU] | Freq: Once | INTRAVENOUS | Status: AC | PRN
  Administered 2018-08-14: 500 [IU]
  Filled 2018-08-14: qty 5

## 2018-08-14 MED ORDER — DIPHENHYDRAMINE HCL 25 MG PO CAPS
ORAL_CAPSULE | ORAL | Status: AC
  Filled 2018-08-14: qty 2

## 2018-08-14 NOTE — Progress Notes (Signed)
Pt had cathflo inserted into port.  After 2 hours, no blood return was obtained. Dr. Irene Limbo was notified, and a consult will be placed to have her chest port evaluated. A PIV was placed for patient to receive treatment today.

## 2018-08-14 NOTE — Patient Instructions (Signed)
[place] Discharge Instructions for Patients Receiving Chemotherapy  Today you received the following chemotherapy agents: Rituxan.  To help prevent nausea and vomiting after your treatment, we encourage you to take your nausea medication as directed  If you develop nausea and vomiting that is not controlled by your nausea medication, call the clinic.   BELOW ARE SYMPTOMS THAT SHOULD BE REPORTED IMMEDIATELY:  *FEVER GREATER THAN 100.5 F  *CHILLS WITH OR WITHOUT FEVER  NAUSEA AND VOMITING THAT IS NOT CONTROLLED WITH YOUR NAUSEA MEDICATION  *UNUSUAL SHORTNESS OF BREATH  *UNUSUAL BRUISING OR BLEEDING  TENDERNESS IN MOUTH AND THROAT WITH OR WITHOUT PRESENCE OF ULCERS  *URINARY PROBLEMS  *BOWEL PROBLEMS  UNUSUAL RASH Items with * indicate a potential emergency and should be followed up as soon as possible.  Feel free to call the clinic should you have any questions or concerns. The clinic phone number is [phone].  Please show the [place] at check-in to the Emergency Department and triage nurse.

## 2018-08-14 NOTE — Telephone Encounter (Signed)
Gave pt avs and calendar  °

## 2018-08-19 ENCOUNTER — Ambulatory Visit: Payer: BLUE CROSS/BLUE SHIELD | Admitting: Hematology

## 2018-08-19 ENCOUNTER — Ambulatory Visit: Payer: BLUE CROSS/BLUE SHIELD

## 2018-08-19 ENCOUNTER — Other Ambulatory Visit: Payer: BLUE CROSS/BLUE SHIELD

## 2018-08-26 ENCOUNTER — Encounter: Payer: Self-pay | Admitting: Hematology

## 2018-09-08 ENCOUNTER — Other Ambulatory Visit: Payer: Self-pay | Admitting: Hematology

## 2018-09-08 DIAGNOSIS — Z95828 Presence of other vascular implants and grafts: Secondary | ICD-10-CM

## 2018-09-10 ENCOUNTER — Telehealth: Payer: Self-pay | Admitting: *Deleted

## 2018-09-10 NOTE — Telephone Encounter (Signed)
Patient sent following in My Chart: "Port-A Cath was not working at last clinic visit on Friday 22 Nov. Chemo given via IV at that visit. TPA was given but nurses still could not get a blood return after several attempts. Port-A-Cath will flush but no blood return. Next appt for labs/flush/MD appt and infusion is scheduled for 13 Oct 2018. I would like to know what options are being considered for this non-working Port-A-Cath. Interventional radiology was mentioned in the [place] and I understood that the issue was being forwarded to Dr. Irene Limbo." Per Dr. Irene Limbo: Contact patient with following options - Port needs to be assessed in IR for possible fibrin clot removal. If Clot removal not possible, port can be removed. If port is removed, patient needs to decide w/MD if new one is needed. Patient contacted - Options given. Thanked caller and verbalized understanding of options.  Patient states she needs to think about this and do some research regarding procedures and costs. States she will send message to provider in [place] after Christmas.

## 2018-09-11 ENCOUNTER — Other Ambulatory Visit: Payer: Self-pay | Admitting: Physician Assistant

## 2018-09-14 ENCOUNTER — Other Ambulatory Visit: Payer: Self-pay | Admitting: Hematology

## 2018-09-14 ENCOUNTER — Encounter (HOSPITAL_COMMUNITY): Payer: Self-pay | Admitting: Interventional Radiology

## 2018-09-14 ENCOUNTER — Ambulatory Visit (HOSPITAL_COMMUNITY)
Admission: RE | Admit: 2018-09-14 | Discharge: 2018-09-14 | Disposition: A | Discharge status: Home / Self Care | Payer: Medicare Other | Source: Ambulatory Visit | Attending: Hematology | Admitting: Hematology

## 2018-09-14 DIAGNOSIS — Y831 Surgical operation with implant of artificial internal device as the cause of abnormal reaction of the patient, or of later complication, without mention of misadventure at the time of the procedure: Secondary | ICD-10-CM | POA: Diagnosis not present

## 2018-09-14 DIAGNOSIS — T82898A Other specified complication of vascular prosthetic devices, implants and grafts, initial encounter: Secondary | ICD-10-CM | POA: Diagnosis not present

## 2018-09-14 DIAGNOSIS — Z95828 Presence of other vascular implants and grafts: Secondary | ICD-10-CM

## 2018-09-14 DIAGNOSIS — T859XXA Unspecified complication of internal prosthetic device, implant and graft, initial encounter: Secondary | ICD-10-CM | POA: Diagnosis not present

## 2018-09-14 DIAGNOSIS — C8590 Non-Hodgkin lymphoma, unspecified, unspecified site: Secondary | ICD-10-CM | POA: Diagnosis not present

## 2018-09-14 HISTORY — PX: IR CV LINE INJECTION: IMG2294

## 2018-09-14 MED ORDER — HEPARIN SOD (PORK) LOCK FLUSH 100 UNIT/ML IV SOLN
INTRAVENOUS | Status: AC
  Filled 2018-09-14: qty 5

## 2018-09-14 MED ORDER — IOPAMIDOL (ISOVUE-300) INJECTION 61%
INTRAVENOUS | Status: AC
  Administered 2018-09-14: 8 mL via INTRAVENOUS
  Filled 2018-09-14: qty 50

## 2018-09-14 MED ORDER — IOPAMIDOL (ISOVUE-300) INJECTION 61%
50.0000 mL | Freq: Once | INTRAVENOUS | Status: AC | PRN
  Administered 2018-09-14: 8 mL via INTRAVENOUS

## 2018-09-14 NOTE — Procedures (Signed)
NHL, no blood return from port  S/p RT IJ PORT INJECTION No comp Stable EBL min Tip svcra Full report in pacs

## 2018-09-21 ENCOUNTER — Encounter: Payer: Self-pay | Admitting: Hematology

## 2018-09-25 ENCOUNTER — Telehealth: Payer: Self-pay | Admitting: *Deleted

## 2018-09-25 NOTE — Telephone Encounter (Signed)
Contacted patient in response to MyChart message regarding removal of port. Per Dr. Irene Limbo, he will place order for patient to have it removed. Advised patient to contact central scheduling once order is placed. Patient verbalized understanding. She said she would call first of next week to schedule procedure and let office know when procedure is scheduled.

## 2018-09-27 IMAGING — CT CT BIOPSY
1 of 2 series · 15 of 32 positions shown, 19 images · non-contrast
Comparison: none

CLINICAL DATA: Generalized lymphadenopathy

[Series 2: i-spiral 5.0 b31f · axial · 0.98mm/px · z∈[-693,-494]mm · 15 of 63 slices shown, 19 images]
[im 3/63  soft-tissue]
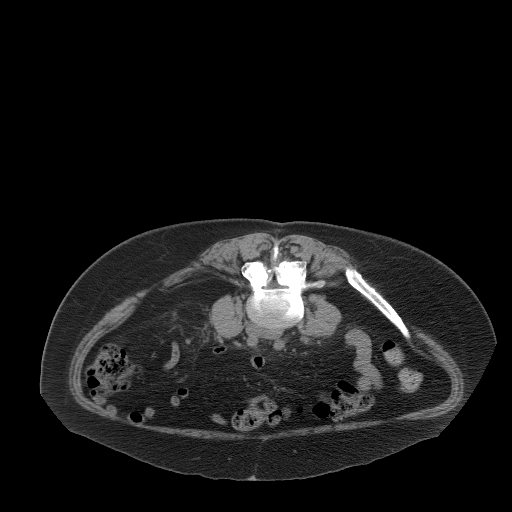
[im 3/63  bone]
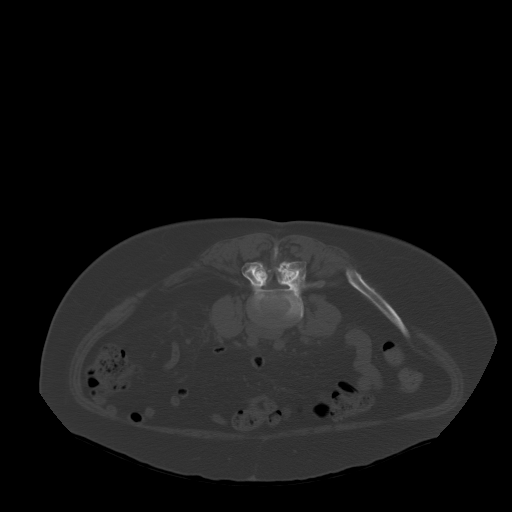
[im 9/63  soft-tissue]
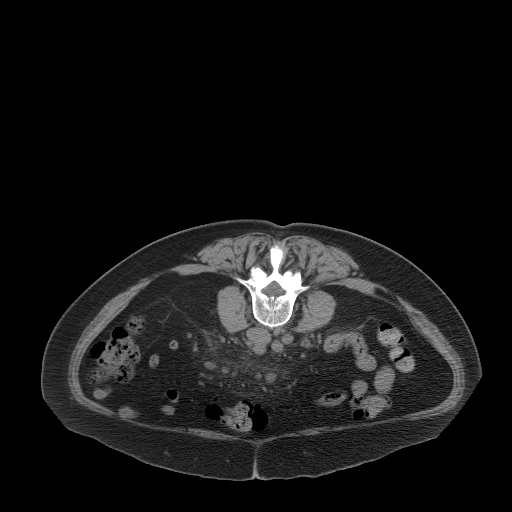
[im 14/63  soft-tissue]
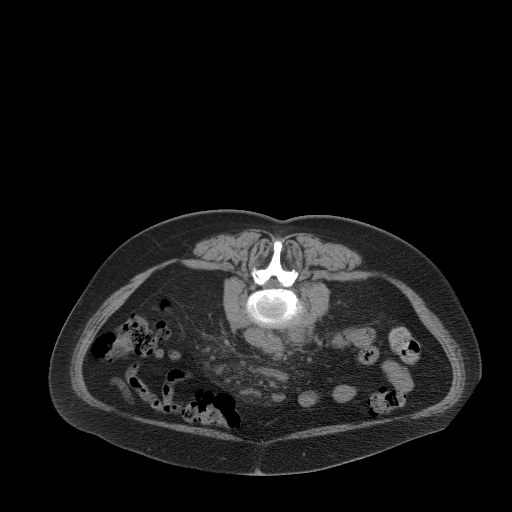
[im 17/63  soft-tissue]
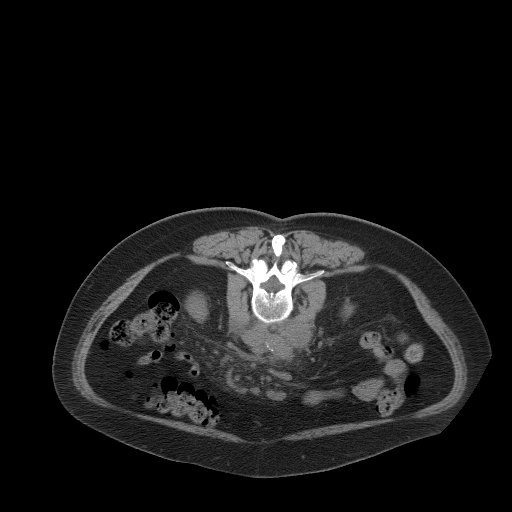
[im 22/63  soft-tissue]
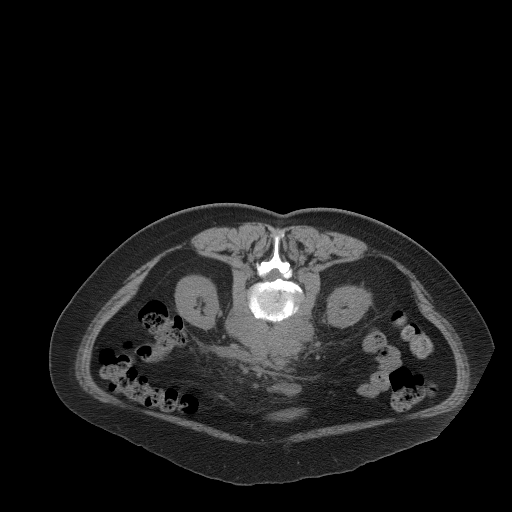
[im 27/63  soft-tissue]
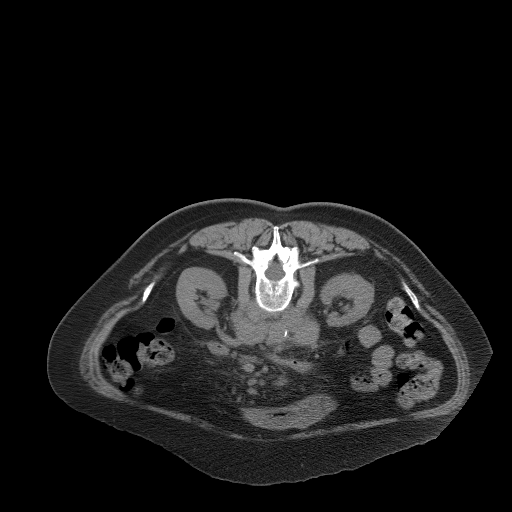
[im 33/63  soft-tissue]
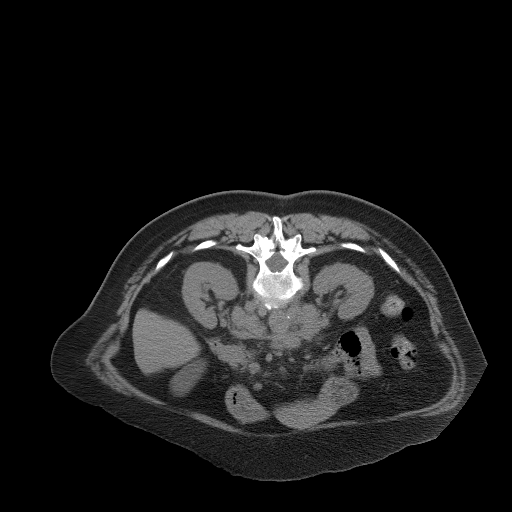
[im 36/63  soft-tissue]
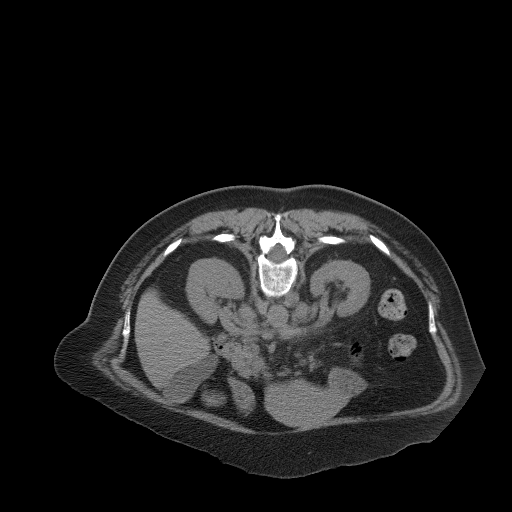
[im 41/63  soft-tissue]
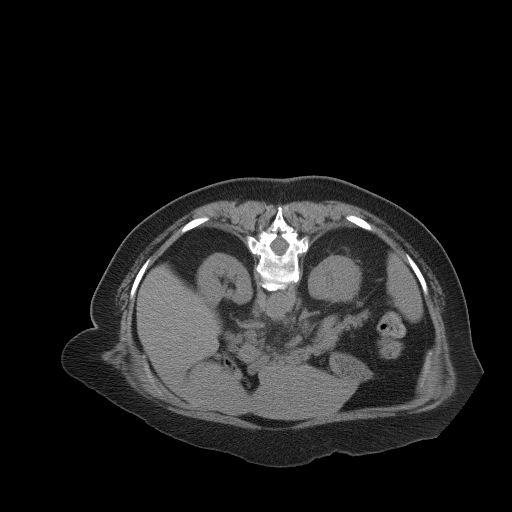
[im 41/63  bone]
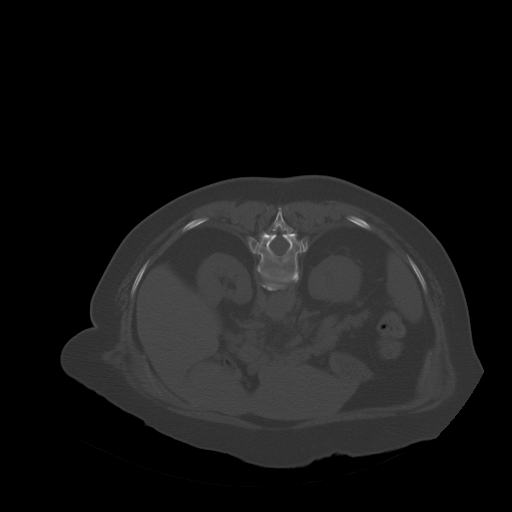
[im 46/63  soft-tissue]
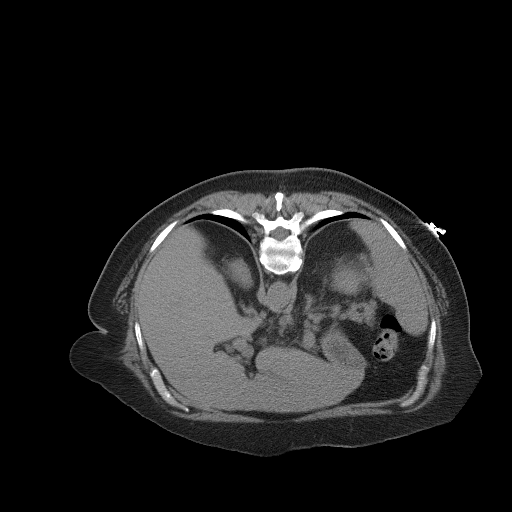
[im 49/63  soft-tissue]
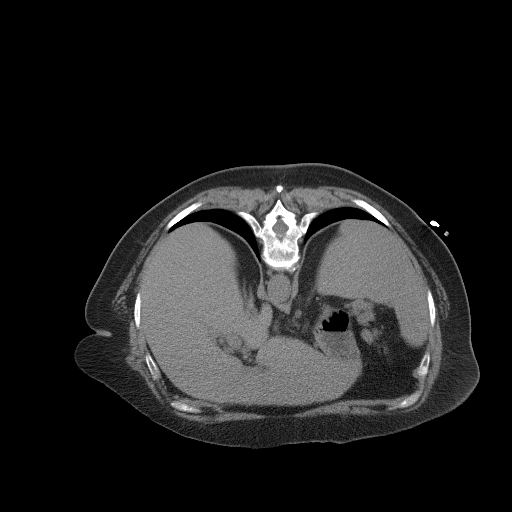
[im 52/63  lung]
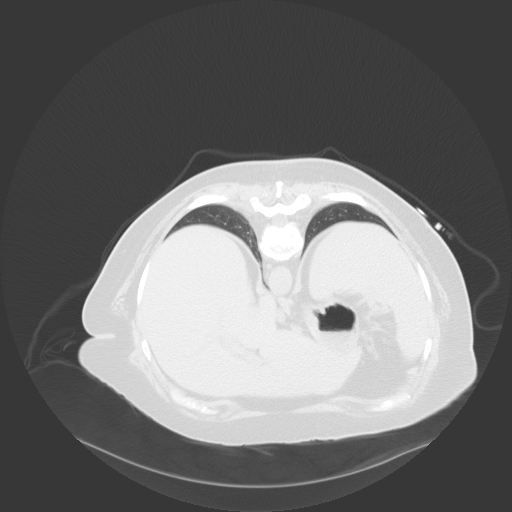
[im 54/63  soft-tissue]
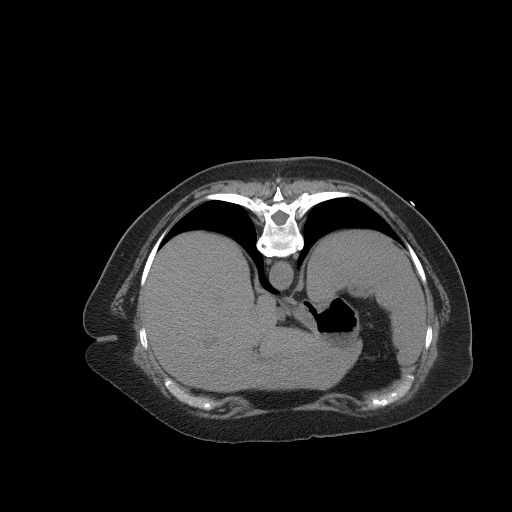
[im 54/63  lung]
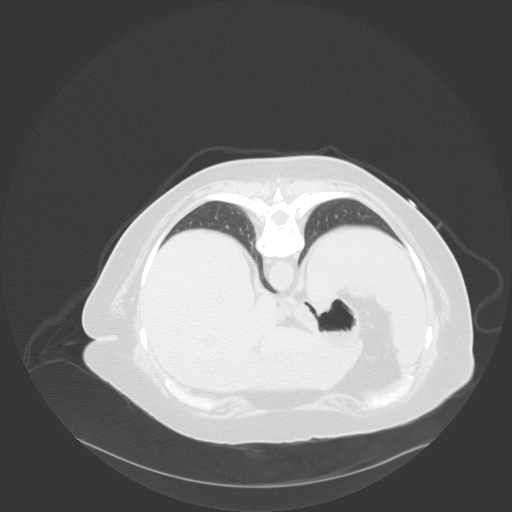
[im 57/63  lung]
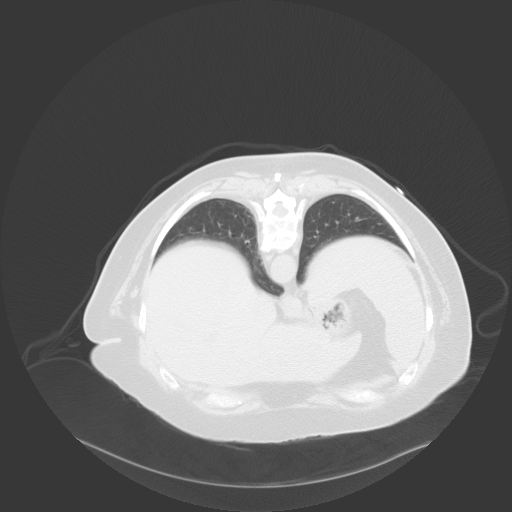
[im 60/63  soft-tissue]
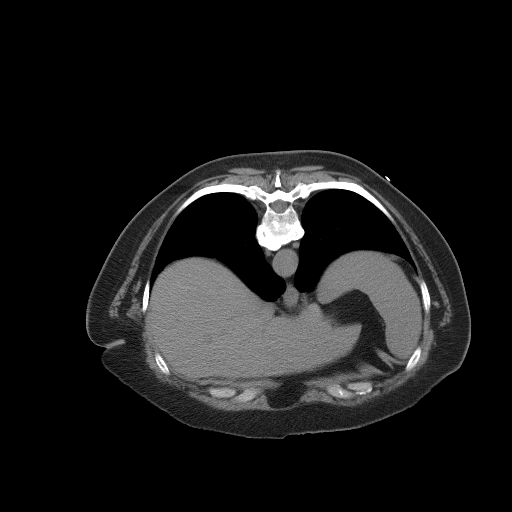
[im 60/63  lung]
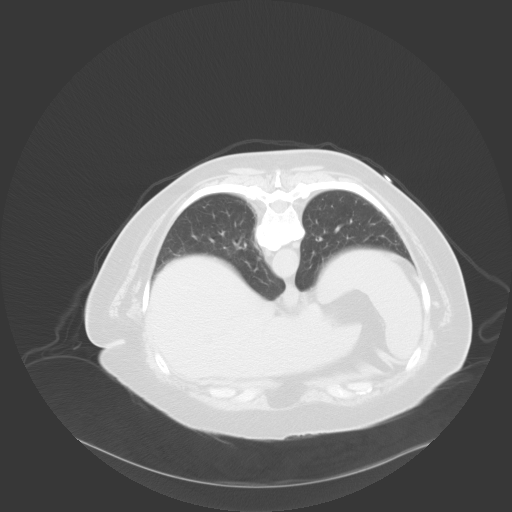

[15 of 32 positions shown; findings below may reference images not displayed]

EXAM:
CT GUIDED CORE BIOPSY OF LEFT RETROPERITONEAL ADENOPATHY

ANESTHESIA/SEDATION:
Intravenous Fentanyl and Versed were administered as conscious
sedation during continuous monitoring of the patient's level of
consciousness and physiological / cardiorespiratory status by the
radiology RN, with a total moderate sedation time of TEN minutes.

PROCEDURE:
The procedure risks, benefits, and alternatives were explained to
the patient. Questions regarding the procedure were encouraged and
answered. The patient understands and consents to the procedure.

patient placed prone. select axial scans through the abdomen
obtained, and the left para-aortic lymphadenopathy was localized. an
appropriate skin entry site was determined and marked.

The operative field was prepped with chlorhexidinein a sterile
fashion, and a sterile drape was applied covering the operative
field. A sterile gown and sterile gloves were used for the
procedure. Local anesthesia was provided with 1% Lidocaine.

Under CT fluoroscopic guidance, a 17 gauge trocar needle was
advanced to the margin of the lesion. Once needle tip position was
confirmed, coaxial 18-gauge core biopsy samples were obtained,
submitted in saline to surgical pathology. The guide needle was
removed. Postprocedure scans show no hemorrhage or other apparent
complication. The patient tolerated the procedure well.

COMPLICATIONS:
None immediate
FINDINGS: Bulky left para-aortic retroperitoneal adenopathy was again
localized. Representative core biopsy samples obtained under CT
fluoroscopic guidance as above.
IMPRESSION: 1. Technically successful CT-guided core biopsy, left para-aortic
retroperitoneal adenopathy.

## 2018-09-28 ENCOUNTER — Telehealth: Payer: Self-pay | Admitting: *Deleted

## 2018-09-28 ENCOUNTER — Other Ambulatory Visit: Payer: Self-pay | Admitting: Hematology

## 2018-09-28 DIAGNOSIS — Z95828 Presence of other vascular implants and grafts: Secondary | ICD-10-CM

## 2018-09-28 NOTE — Telephone Encounter (Signed)
Patient left VM:  Attempted to schedule port removal - per IR, order not in yet. Message sent to MD.  Order entered, patient now aware, will make appt for next week.

## 2018-09-30 ENCOUNTER — Encounter: Payer: Self-pay | Admitting: Hematology

## 2018-10-06 ENCOUNTER — Other Ambulatory Visit: Payer: Self-pay | Admitting: Radiology

## 2018-10-07 ENCOUNTER — Other Ambulatory Visit: Payer: Self-pay

## 2018-10-07 ENCOUNTER — Ambulatory Visit (HOSPITAL_COMMUNITY)
Admission: RE | Admit: 2018-10-07 | Discharge: 2018-10-07 | Disposition: A | Discharge status: Home / Self Care | Payer: Medicare Other | Source: Ambulatory Visit | Attending: Hematology | Admitting: Hematology

## 2018-10-07 ENCOUNTER — Encounter (HOSPITAL_COMMUNITY): Payer: Self-pay

## 2018-10-07 DIAGNOSIS — T82598A Other mechanical complication of other cardiac and vascular devices and implants, initial encounter: Secondary | ICD-10-CM | POA: Diagnosis not present

## 2018-10-07 DIAGNOSIS — Z95828 Presence of other vascular implants and grafts: Secondary | ICD-10-CM

## 2018-10-07 DIAGNOSIS — C8590 Non-Hodgkin lymphoma, unspecified, unspecified site: Secondary | ICD-10-CM | POA: Insufficient documentation

## 2018-10-07 HISTORY — PX: IR REMOVAL TUN ACCESS W/ PORT W/O FL MOD SED: IMG2290

## 2018-10-07 HISTORY — DX: Personal history of antineoplastic chemotherapy: Z92.21

## 2018-10-07 LAB — CBC
HCT: 37.4 % (ref 36.0–46.0)
Hemoglobin: 12.4 g/dL (ref 12.0–15.0)
MCH: 30.5 pg (ref 26.0–34.0)
MCHC: 33.2 g/dL (ref 30.0–36.0)
MCV: 91.9 fL (ref 80.0–100.0)
PLATELETS: 226 10*3/uL (ref 150–400)
RBC: 4.07 MIL/uL (ref 3.87–5.11)
RDW: 12.5 % (ref 11.5–15.5)
WBC: 4.9 10*3/uL (ref 4.0–10.5)
nRBC: 0 % (ref 0.0–0.2)

## 2018-10-07 LAB — APTT: aPTT: 26 seconds (ref 24–36)

## 2018-10-07 LAB — PROTIME-INR
INR: 0.91
PROTHROMBIN TIME: 12.2 s (ref 11.4–15.2)

## 2018-10-07 MED ORDER — LIDOCAINE HCL 1 % IJ SOLN: INTRAMUSCULAR | Status: AC | PRN

## 2018-10-07 MED ORDER — FENTANYL CITRATE (PF) 100 MCG/2ML IJ SOLN
INTRAMUSCULAR | Status: AC
  Filled 2018-10-07: qty 2

## 2018-10-07 MED ORDER — CEFAZOLIN SODIUM-DEXTROSE 2-4 GM/100ML-% IV SOLN
2.0000 g | INTRAVENOUS | Status: AC
  Administered 2018-10-07: 2 g via INTRAVENOUS

## 2018-10-07 MED ORDER — MIDAZOLAM HCL 2 MG/2ML IJ SOLN
INTRAMUSCULAR | Status: AC | PRN
  Administered 2018-10-07 (×2): 1 mg via INTRAVENOUS

## 2018-10-07 MED ORDER — FENTANYL CITRATE (PF) 100 MCG/2ML IJ SOLN
INTRAMUSCULAR | Status: AC | PRN
  Administered 2018-10-07 (×2): 50 ug via INTRAVENOUS

## 2018-10-07 MED ORDER — HYDROCODONE-ACETAMINOPHEN 5-325 MG PO TABS: 1.0000 | ORAL_TABLET | ORAL | Status: DC | PRN

## 2018-10-07 MED ORDER — MIDAZOLAM HCL 2 MG/2ML IJ SOLN
INTRAMUSCULAR | Status: AC
  Filled 2018-10-07: qty 2

## 2018-10-07 MED ORDER — LIDOCAINE-EPINEPHRINE (PF) 2 %-1:200000 IJ SOLN
INTRAMUSCULAR | Status: AC
  Filled 2018-10-07: qty 20

## 2018-10-07 MED ORDER — SODIUM CHLORIDE 0.9 % IV SOLN
INTRAVENOUS | Status: DC
  Administered 2018-10-07: 08:00:00 via INTRAVENOUS

## 2018-10-07 MED ORDER — LIDOCAINE-EPINEPHRINE (PF) 2 %-1:200000 IJ SOLN
INTRAMUSCULAR | Status: AC | PRN
  Administered 2018-10-07: 10 mL

## 2018-10-07 MED ORDER — CEFAZOLIN SODIUM-DEXTROSE 2-4 GM/100ML-% IV SOLN
INTRAVENOUS | Status: AC
  Administered 2018-10-07: 2 g via INTRAVENOUS
  Filled 2018-10-07: qty 100

## 2018-10-07 NOTE — Procedures (Signed)
Interventional Radiology Procedure:   Indications: Port dysfunction  Procedure: Port removal  Findings: Complete removal of port  Complications: None     EBL: Minimal  Plan: Discharge to home in one hour.     Kadija Cruzen R. Anselm Pancoast, MD  Pager: [phone]

## 2018-10-07 NOTE — H&P (Signed)
Chief Complaint: Patient was seen in consultation today for port removal at the request of Brunetta Genera  Referring Physician(s): Brunetta Genera  Supervising Physician: Markus Daft  Patient Status: [facility] - Out-pt  History of Present Illness: Katelyn Reeves is a 68 y.o. female with hx of lymphoma. She had port placed in 11/2016 and it has worked well until recently. She came in for port injection due to dysfunction and it was found that the port tubing had retracted some and may have been kinked. Given that she only has a few remaining treatments, it was felt best just to remove this port and use PIV instead of replacing. PMHx, meds, labs, imaging, allergies reviewed. Feels well, no recent fevers, chills, illness. Has been NPO today as directed.   Past Medical History:  Diagnosis Date  . Back pain   . Headache    migraines  . History of chemotherapy   . Shingles   . Swollen lymph nodes    left axilla, rectoperitonal     Past Surgical History:  Procedure Laterality Date  . ABDOMINAL HYSTERECTOMY  1980   total abdominal hysterectomy with tubes and ovaries  . BREAST SURGERY Left 1980's   breast biopsy  . CYST EXCISION Left 2017   left scapula  . HEMORRHOID SURGERY  1970's  . IR CV LINE INJECTION  09/14/2018  . IR GENERIC HISTORICAL  12/20/2016   IR FLUORO GUIDE PORT INSERTION RIGHT 12/20/2016 Markus Daft, MD [facility]-INTER[facility] RAD  . IR GENERIC HISTORICAL  12/20/2016   IR US GUIDE VASC ACCESS RIGHT 12/20/2016 Markus Daft, MD [facility]  . TUBAL LIGATION  1980's   before hysterectomy  . WRIST GANGLION EXCISION Left 1970's    Allergies: Sulfa antibiotics  Medications: Prior to Admission medications   Medication Sig Start Date End Date Taking? Authorizing Provider  lidocaine-prilocaine (EMLA) cream Apply to port-a-cath 1 hr prior to treatment/lab draw 12/08/17  Yes Brunetta Genera, MD  HYDROcodone-acetaminophen (NORCO/VICODIN) 5-325 MG tablet as needed. 06/16/17    [provider]  traMADol (ULTRAM) 50 MG tablet Take 1 tablet (50 mg total) by mouth every 6 (six) hours as needed. Patient not taking: Reported on 05/28/2018 03/23/18   Harle Stanford., PA-C  prochlorperazine (COMPAZINE) 10 MG tablet Take 1 tablet (10 mg total) by mouth every 6 (six) hours as needed (Nausea or vomiting). 12/19/16 07/04/17  Brunetta Genera, MD     History reviewed. No pertinent family history.  Social History   Socioeconomic History  . Marital status: Divorced    Spouse name: Not on file  . Number of children: Not on file  . Years of education: Not on file  . Highest education level: Not on file  Occupational History  . Not on file  Social Needs  . Financial resource strain: Not on file  . Food insecurity:    Worry: Not on file    Inability: Not on file  . Transportation needs:    Medical: Not on file    Non-medical: Not on file  Tobacco Use  . Smoking status: Never Smoker  . Smokeless tobacco: Never Used  Substance and Sexual Activity  . Alcohol use: No  . Drug use: No  . Sexual activity: Not on file  Lifestyle  . Physical activity:    Days per week: Not on file    Minutes per session: Not on file  . Stress: Not on file  Relationships  . Social connections:    Talks on  phone: Not on file    Gets together: Not on file    Attends religious service: Not on file    Active member of club or organization: Not on file    Attends meetings of clubs or organizations: Not on file    Relationship status: Not on file  Other Topics Concern  . Not on file  Social History Narrative  . Not on file     Review of Systems: A 12 point ROS discussed and pertinent positives are indicated in the HPI above.  All other systems are negative.  Review of Systems  Vital Signs: BP (!) 165/87 (BP Location: Right Arm)   Pulse 73   Temp 97.7 F (36.5 C) (Oral)   Resp 16   SpO2 98%   Physical Exam Constitutional:      Appearance: Normal appearance.  HENT:      Mouth/Throat:     Mouth: Mucous membranes are moist.     Pharynx: Oropharynx is clear.  Cardiovascular:     Rate and Rhythm: Normal rate and regular rhythm.     Heart sounds: Normal heart sounds.  Pulmonary:     Effort: Pulmonary effort is normal. No respiratory distress.     Breath sounds: Normal breath sounds.  Skin:    General: Skin is warm and dry.     Comments: (R)chest port palpable, old incision site well healed, no erythema  Neurological:     General: No focal deficit present.     Mental Status: She is alert and oriented to person, place, and time.       Imaging: Ir Cv Line Injection  Result Date: 09/14/2018 INDICATION: Non-Hodgkin's lymphoma, poor functioning port catheter, no blood return EXAM: FLUOROSCOPIC RIGHT PORT CATHETER INJECTION MEDICATIONS: NONE. ANESTHESIA/SEDATION: Moderate Sedation Time: None. The patient's level of consciousness and vital signs were monitored continuously by radiology nursing throughout the procedure under my direct supervision. FLUOROSCOPY TIME:  Fluoroscopy Time: 0 minutes 6 seconds (6 mGy). COMPLICATIONS: None immediate. PROCEDURE: Informed written consent was obtained from the patient after a thorough discussion of the procedural risks, benefits and alternatives. All questions were addressed. Maximal Sterile Barrier Technique was utilized including caps, mask, sterile gowns, sterile gloves, sterile drape, hand hygiene and skin antiseptic. A timeout was performed prior to the initiation of the procedure. Under sterile conditions, the existing accessed right IJ port catheter was injected with contrast. Subtraction imaging performed. Port catheter is intact and patent. The tubing has retracted into the right innominate vein just above the innominate venous confluence. This results in focal stenosis of the left innominate vein at the catheter tip. There is reflux of contrast into jugular and mediastinal collateral veins. Below the right innominate  stenosis, the SVC remains patent. 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 Recommend port catheter removal versus revision based on the remaining chemotherapy schedule. Findings discussed with the patient. Electronically Signed   By: Jerilynn Mages.  Shick M.D.   On: 09/14/2018 08:54    Labs:  CBC: Recent Labs    04/29/18 0828 05/28/18 1054 08/14/18 1015 10/07/18 0755  WBC 5.4 5.9 5.4 4.9  HGB 12.4 13.4 12.6 12.4  HCT 36.3 38.5 36.4 37.4  PLT 231 248 222 226    COAGS: Recent Labs    10/07/18 0755  INR 0.91  APTT 26    BMP: Recent Labs    02/27/18 0852 04/29/18 4709  05/28/18 1054 08/14/18 1105  NA 140 141 141 142  K 4.0 3.9 4.3 4.1  CL 105 105 106 108  CO2 24 24 26 24   GLUCOSE 138 126* 142* 120*  BUN 11 11 11 12   CALCIUM 9.7 9.4 10.0 9.5  CREATININE 0.79 0.75 0.81 0.83  GFRNONAA >60 >60 >60 >60  GFRAA >60 >60 >60 >60    LIVER FUNCTION TESTS: Recent Labs    02/27/18 0852 04/29/18 0828 05/28/18 1054 08/14/18 1105  BILITOT 0.4 0.6 0.5 0.5  AST 20 24 20 21   ALT 13 25 20 17   ALKPHOS 77 75 84 76  PROT 7.2 7.5 7.6 7.3  ALBUMIN 4.1 4.2 4.1 4.1    TUMOR MARKERS: No results for input(s): AFPTM, CEA, CA199, CHROMGRNA in the last 8760 hours.  Assessment and Plan: Lymphoma Dysfunctional port. For port removal. Risks and benefits of image guided port-a-catheter removal was discussed with the patient including, but not limited to bleeding, infection.  All of the patient's questions were answered, patient is agreeable to proceed. Consent signed and in chart.    Thank you for this interesting consult.  I greatly enjoyed meeting CHARDAI GANGEMI and look forward to participating in their care.  A copy of this report was sent to the requesting provider on this date.  Electronically Signed: Ascencion Dike, PA-C 10/07/2018, 9:22 AM   I spent a total of  20 minutes in face to face in clinical consultation, greater than 50% of which was counseling/coordinating care for port removal

## 2018-10-07 NOTE — Discharge Instructions (Signed)
Implanted Port Removal, Care After This sheet gives you information about how to care for yourself after your procedure. Your health care provider may also give you more specific instructions. If you have problems or questions, contact your health care provider. What can I expect after the procedure? After the procedure, it is common to have:  Soreness or pain near your incision.  Some swelling or bruising near your incision. Follow these instructions at home: Medicines  Take over-the-counter and prescription medicines only as told by your health care provider.  If you were prescribed an antibiotic medicine, take it as told by your health care provider. Do not stop taking the antibiotic even if you start to feel better. Bathing  Do not take baths, swim, or use a hot tub until your health care provider approves. Ask your health care provider if you can take showers. You may only be allowed to take sponge baths. Incision care   Follow instructions from your health care provider about how to take care of your incision. Make sure you: ? Wash your hands with soap and water before you change your bandage (dressing). If soap and water are not available, use hand sanitizer. ? Change your dressing as told by your health care provider. ? Keep your dressing dry. ? Leave stitches (sutures), skin glue, or adhesive strips in place. These skin closures may need to stay in place for 2 weeks or longer. If adhesive strip edges start to loosen and curl up, you may trim the loose edges. Do not remove adhesive strips completely unless your health care provider tells you to do that.  Check your incision area every day for signs of infection. Check for: ? More redness, swelling, or pain. ? More fluid or blood. ? Warmth. ? Pus or a bad smell. Driving   Do not drive for 24 hours if you were given a medicine to help you relax (sedative) during your procedure.  If you did not receive a sedative, ask your  health care provider when it is safe to drive. Activity  Return to your normal activities as told by your health care provider. Ask your health care provider what activities are safe for you.  Do not lift anything that is heavier than 10 lb (4.5 kg), or the limit that you are told, until your health care provider says that it is safe.  Do not do activities that involve lifting your arms over your head. General instructions  Do not use any products that contain nicotine or tobacco, such as cigarettes and e-cigarettes. These can delay healing. If you need help quitting, ask your health care provider.  Keep all follow-up visits as told by your health care provider. This is important. Contact a health care provider if:  You have more redness, swelling, or pain around your incision.  You have more fluid or blood coming from your incision.  Your incision feels warm to the touch.  You have pus or a bad smell coming from your incision.  You have pain that is not relieved by your pain medicine. Get help right away if you have:  A fever or chills.  Chest pain.  Difficulty breathing. Summary  After the procedure, it is common to have pain, soreness, swelling, or bruising near your incision.  If you were prescribed an antibiotic medicine, take it as told by your health care provider. Do not stop taking the antibiotic even if you start to feel better.  Do not drive for 24 hours  if you were given a sedative during your procedure.  Return to your normal activities as told by your health care provider. Ask your health care provider what activities are safe for you. This information is not intended to replace advice given to you by your health care provider. Make sure you discuss any questions you have with your health care provider. Document Released: 08/21/2015 Document Revised: 10/23/2017 Document Reviewed: 10/23/2017 Elsevier Interactive Patient Education  2019 [employer].   Moderate Conscious Sedation, Adult, Care After These instructions provide you with information about caring for yourself after your procedure. Your health care provider may also give you more specific instructions. Your treatment has been planned according to current medical practices, but problems sometimes occur. Call your health care provider if you have any problems or questions after your procedure. What can I expect after the procedure? After your procedure, it is common:  To feel sleepy for several hours.  To feel clumsy and have poor balance for several hours.  To have poor judgment for several hours.  To vomit if you eat too soon. Follow these instructions at home: For at least 24 hours after the procedure:   Do not: ? Participate in activities where you could fall or become injured. ? Drive. ? Use heavy machinery. ? Drink alcohol. ? Take sleeping pills or medicines that cause drowsiness. ? Make important decisions or sign legal documents. ? Take care of children on your own.  Rest. Eating and drinking  Follow the diet recommended by your health care provider.  If you vomit: ? Drink water, juice, or soup when you can drink without vomiting. ? Make sure you have little or no nausea before eating solid foods. General instructions  Have a responsible adult stay with you until you are awake and alert.  Take over-the-counter and prescription medicines only as told by your health care provider.  If you smoke, do not smoke without supervision.  Keep all follow-up visits as told by your health care provider. This is important. Contact a health care provider if:  You keep feeling nauseous or you keep vomiting.  You feel light-headed.  You develop a rash.  You have a fever. Get help right away if:  You have trouble breathing. This information is not intended to replace advice given to you by your health care provider. Make sure you discuss any questions  you have with your health care provider. Document Released: 06/30/2013 Document Revised: 02/12/2016 Document Reviewed: 12/30/2015 Elsevier Interactive Patient Education  2019 [employer].

## 2018-10-12 NOTE — Progress Notes (Signed)
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 female who has been referred to [place] by Dr .Irene Limbo, Cloria [place], MD for evaluation and management of suspected non-Hodgkin's lymphoma.  Patient notes that she presented with back pain for 4-6 weeks and eventually had pain radiating to her right inguinal area. She notes that she has had interstitial cystitis in the past. She was thought to have a urinary tract infection and was treated with Macrodantin. Says the pain got worse and started radiating into the right groin she had a CT of the abdomen renal stone protocol on 10/25/2016 which showed extensive retroperitoneal lymphadenopathy concerning for lymphoproliferative disorder. She was incidentally noted to have a nodular border of the liver with possible liver cirrhosis. No urinary stones noted.  She subsequently had a PET CT scan ordered by her primary care physician which was done on 11/07/2016. This showed diffuse hypermetabolic spleen with mild splenomegaly. Hypermetabolic adenopathy in the left supraclavicular, bilateral axillary, peripancreatic, gastrohepatic ligament, celiac, porta hepatis, periaortic, and left external iliac chains. Hypermetabolic mesenteric lymph nodes. Appearance favors lymphoma.  Patient notes some night sweats for 2 weeks. Has lost about 10 pounds in the last month. No acute fevers or chills. Notes the back pain is controlled with when necessary Vicodin.  INTERVAL HISTORY  Katelyn Reeves is here for her scheduled follow-up regarding her follicular lymphoma and seventh maintenance dose of Rituxan. The patient's last visit with [place] was on 08/14/18. The pt reports that she is doing well overall.    The pt reports that she is having some minimal back discomfort, not pain, with some possible urinary frequency, but she is not sure if this is much different than her baseline. She also notes that she had a cold roughtly 7 weeks ago which lasted 7 days, and did not have any fevers. She took Claritin for a few days with relief. The pt denies any concerns for current infections and denies fevers, chills, and night sweats. The pt denies any concerns for shingles as well.   She recently bought a sewing machine and is enjoying her retirement very well. She is currently enjoying good energy levels and is eating well overall.  Lab results today (10/13/18) of CBC w/diff is as follows: all values are WNL. 10/13/18 LDH is . Lab Results  Component Value Date   LDH 134 10/13/2018   On review of systems, pt reports good energy levels, mild back discomfort, possible urinary frequency, weight gain, good appetite, and denies concerns for current infection, fevers, chills, night sweats, new skin rashes, mouth sores, abdominal pains, leg swelling, and any other symptoms.   MEDICAL HISTORY:  #1 fibrocystic disease of the breast #2 hypertension #3 constipation #4 duplicated right sided ureters #5 imaging concern for possible liver cirrhosis  SURGICAL HISTORY:  #1 total abdominal hysterectomy with bilateral salpingo-oophorectomy for ovarian cyst. #2 Lipoma removal left upper back #3 hemorrhoidectomy  SOCIAL HISTORY: Social History   Socioeconomic History  . Marital status: Divorced    Spouse name: Not on file  . Number of children: Not on file  . Years of education: Not on file  . Highest education level: Not on file  Occupational History  . Not on file  Social Needs  . Financial resource strain: Not on file  . Food insecurity:    Worry: Not on file    Inability: Not on file  . Transportation needs:    Medical: Not on file    Non-medical: Not on file  Tobacco Use  . Smoking status: Never  Smoker  . Smokeless tobacco: Never Used  Substance and Sexual Activity  . Alcohol use: No  . Drug use: No  . Sexual activity: Not on file  Lifestyle  . Physical activity:    Days per week: Not on file    Minutes per session: Not on file  . Stress: Not on file  Relationships  . Social connections:    Talks on phone: Not on file    Gets together: Not on file    Attends religious service: Not on file    Active member of club or organization: Not on file    Attends meetings of clubs or organizations: Not on file    Relationship status: Not on file  . Intimate partner violence:    Fear of current or ex partner: Not on file    Emotionally abused: Not on file    Physically abused: Not on file    Forced sexual activity: Not on file  Other Topics Concern  . Not on file  Social History Narrative  . Not on file  Nonsmoker no issues with alcohol use or drug use. 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  FAMILY HISTORY:  Notes her mother had ovarian cancer followed by leukemia and died at age 14 years Maternal aunt gastric cancer  ALLERGIES:  is allergic to sulfa antibiotics.  MEDICATIONS:  Current Outpatient Medications  Medication Sig Dispense Refill  . HYDROcodone-acetaminophen (NORCO/VICODIN) 5-325 MG tablet as needed.  0  . lidocaine-prilocaine (EMLA) cream Apply to port-a-cath 1 hr prior to treatment/lab draw 30 g 0  . traMADol (ULTRAM) 50 MG tablet Take 1 tablet (50 mg total) by mouth every 6 (six) hours as needed. (Patient not taking: Reported on 05/28/2018) 30 tablet 0   No current facility-administered medications for this visit.    Facility-Administered Medications Ordered in Other Visits  Medication Dose Route Frequency Provider Last Rate Last Dose  . sodium chloride flush (NS) 0.9 % injection 10 mL  10 mL Intracatheter PRN Brunetta Genera, MD   10 mL at 07/04/17 1540    REVIEW OF SYSTEMS:    A 10+  POINT REVIEW OF SYSTEMS WAS OBTAINED including neurology, dermatology, psychiatry, cardiac, respiratory, lymph, extremities, GI, GU, Musculoskeletal, constitutional, breasts, reproductive, HEENT.  All pertinent positives are noted in the HPI.  All others are negative.   PHYSICAL EXAMINATION: ECOG PERFORMANCE STATUS: 1 - Symptomatic but completely ambulatory  Vitals:   10/13/18 0917  BP: (!) 157/74  Pulse: 70  Resp: 18  Temp: 98.3 F (36.8 C)  SpO2: 100%   Filed Weights   10/13/18 0917  Weight: 200 lb 1.6 oz (90.8 kg)   .Body mass index is 35.45 kg/m.  GENERAL:alert, in no acute distress and comfortable SKIN: no acute rashes, no significant lesions EYES: conjunctiva are pink and non-injected, sclera anicteric OROPHARYNX: MMM, no exudates, no oropharyngeal erythema or ulceration NECK: supple, no JVD LYMPH:  no palpable lymphadenopathy in the cervical, axillary or inguinal regions LUNGS: clear to auscultation b/l with normal respiratory effort HEART: regular rate & rhythm ABDOMEN:  normoactive bowel sounds , non tender, not distended. No palpable  hepatosplenomegaly.  Extremity: no pedal edema PSYCH: alert & oriented x 3 with fluent speech NEURO: no focal motor/sensory deficits   LABORATORY DATA:  I have reviewed the data as listed  . CBC Latest Ref Rng & Units 10/13/2018 10/07/2018 08/14/2018  WBC 4.0 - 10.5 K/uL 5.7 4.9 5.4  Hemoglobin 12.0 - 15.0 g/dL 12.3 12.4 12.6  Hematocrit 36.0 - 46.0 % 36.7 37.4 36.4  Platelets 150 - 400 K/uL 228 226 222   CBC    Component Value Date/Time   WBC 5.7 10/13/2018 0844   RBC 4.07 10/13/2018 0844   HGB 12.3 10/13/2018 0844   HGB 13.4 05/28/2018 1054   HGB 12.6 09/04/2017 1006   HCT 36.7 10/13/2018 0844   HCT 36.6 09/04/2017 1006   PLT 228 10/13/2018 0844   PLT 248 05/28/2018 1054   PLT 222 09/04/2017 1006   MCV 90.2 10/13/2018 0844   MCV 88.4 09/04/2017 1006   MCH 30.2 10/13/2018 0844   MCHC 33.5 10/13/2018 0844   RDW 12.1  10/13/2018 0844   RDW 13.1 09/04/2017 1006   LYMPHSABS 1.2 10/13/2018 0844   LYMPHSABS 1.1 09/04/2017 1006   MONOABS 0.4 10/13/2018 0844   MONOABS 0.3 09/04/2017 1006   EOSABS 0.3 10/13/2018 0844   EOSABS 0.2 09/04/2017 1006   BASOSABS 0.0 10/13/2018 0844   BASOSABS 0.0 09/04/2017 1006   . CMP Latest Ref Rng & Units 10/13/2018 08/14/2018 05/28/2018  Glucose 70 - 99 mg/dL 151(H) 120(H) 142(H)  BUN 8 - 23 mg/dL 13 12 11   Creatinine 0.44 - 1.00 mg/dL 0.77 0.83 0.81  Sodium 135 - 145 mmol/L 139 142 141  Potassium 3.5 - 5.1 mmol/L 4.3 4.1 4.3  Chloride 98 - 111 mmol/L 105 108 106  CO2 22 - 32 mmol/L 23 24 26   Calcium 8.9 - 10.3 mg/dL 9.6 9.5 10.0  Total Protein 6.5 - 8.1 g/dL 7.3 7.3 7.6  Total Bilirubin 0.3 - 1.2 mg/dL 0.4 0.5 0.5  Alkaline Phos 38 - 126 U/L 76 76 84  AST 15 - 41 U/L 19 21 20   ALT 0 - 44 U/L 20 17 20    . Lab Results  Component Value Date   LDH 134 10/13/2018   Component     Latest Ref Rng & Units 11/12/2016  Hep C Virus Ab     0.0 - 0.9 s/co ratio <0.1  Hepatitis B Surface Ag     Negative Negative  Hep B Core Ab, Tot     Negative Negative       RADIOGRAPHIC STUDIES: I have personally reviewed the radiological images as listed and agreed with the findings in the report. Ir Removal [employer] W/o Fl  Result Date: 10/07/2018 INDICATION: 68 year old female with history of lymphoma. Port-A-Cath was placed on 12/20/2016 but recently started having problems with catheter aspiration. Port injection suggested that the port had retracted back into the right innominate vein. Plan for port removal since the patient has only a few remaining treatments. EXAM: REMOVAL RIGHT IJ VEIN PORT-A-CATH MEDICATIONS: Ancef 2 g; The antibiotic was administered within an appropriate time interval prior to skin puncture. ANESTHESIA/SEDATION: Moderate (conscious) sedation was employed during this procedure. A total of Versed 2 mg and Fentanyl 100 mcg was administered  intravenously. Moderate Sedation Time: 28 minutes. The patient's level of consciousness and vital signs were monitored continuously by radiology nursing throughout the procedure under my direct supervision. FLUOROSCOPY TIME:  None COMPLICATIONS: None immediate. PROCEDURE: Informed written consent was obtained from  the patient after a thorough discussion of the procedural risks, benefits and alternatives. All questions were addressed. Maximal Sterile Barrier Technique was utilized including caps, mask, sterile gowns, sterile gloves, sterile drape, hand hygiene and skin antiseptic. A timeout was performed prior to the initiation of the procedure. The right chest was prepped and draped in a sterile fashion. Lidocaine was utilized for local anesthesia. An incision was made over the previously healed surgical incision. Utilizing blunt dissection, the port catheter and reservoir were removed from the underlying subcutaneous tissue in their entirety. Securing sutures were also removed. The pocket was irrigated with a copious amount of sterile normal saline. The subcutaneous tissue was closed with 3-0 Vicryl interrupted subcutaneous stitches. A 4-0 Vicryl running subcuticular stitch was utilized to approximate the skin. Dermabond was applied. IMPRESSION: Successful right IJ vein Port-A-Cath explant. Electronically Signed   By: Markus Daft M.D.   On: 10/07/2018 11:21   Ir Cv Line Injection  Result Date: 09/14/2018 INDICATION: Non-Hodgkin's lymphoma, poor functioning port catheter, no blood return EXAM: FLUOROSCOPIC RIGHT PORT CATHETER INJECTION MEDICATIONS: NONE. ANESTHESIA/SEDATION: Moderate Sedation Time: None. The patient's level of consciousness and vital signs were monitored continuously by radiology nursing throughout the procedure under my direct supervision. FLUOROSCOPY TIME:  Fluoroscopy Time: 0 minutes 6 seconds (6 mGy). COMPLICATIONS: None immediate. PROCEDURE: Informed written consent was obtained from the  patient after a thorough discussion of the procedural risks, benefits and alternatives. All questions were addressed. Maximal Sterile Barrier Technique was utilized including caps, mask, sterile gowns, sterile gloves, sterile drape, hand hygiene and skin antiseptic. A timeout was performed prior to the initiation of the procedure. Under sterile conditions, the existing accessed right IJ port catheter was injected with contrast. Subtraction imaging performed. Port catheter is intact and patent. The tubing has retracted into the right innominate vein just above the innominate venous confluence. This results in focal stenosis of the left innominate vein at the catheter tip. There is reflux of contrast into jugular and mediastinal collateral veins. Below the right innominate stenosis, the SVC remains patent. 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 Recommend port catheter removal versus revision based on the remaining chemotherapy schedule. Findings discussed with the patient. Electronically Signed   By: Jerilynn Mages.  Shick M.D.   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  Presented with Generalized FDG avid lymphadenopathy - predominantly in the abdomen/Retroperitoneum. Also noted to have left supraclavicular and bilateral axillary left more than right FDG avid lymphadenopathy. Based on imaging this would represent at least Stage III disease if this were a lymphoma. LDH level is not significantly elevated. Blood counts are stable. PET/CT scan does show fairly active disease which is FDG avid LNadenopathy and FDG avid splenomegaly. Patient has some constitutional symptoms with about a 10 pound weight loss and  some night sweats. Hepatitis profile negative. No other obvious new focal symptoms.  Patient has had an ECHO which shows nl EF  Patient is status post 6 cycles of R CHOP with no prohibitive toxicities.  PET/CT on 8/72018 - showed complete metabolic response with no metabolically active disease. A few borderline lymph nodes in the retroperitoneum.  CT chest/abd/pelvis 11/04/2017:  No new or  progressive findings to suggest recurrent disease. The small abdominal retroperitoneal lymph nodes are unchanged when comparing to PET-CT of 04/29/2017.   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 Improved with massage therapy  #4 h/o mild shingles  PLAN:  -Discussed pt labwork today, 10/13/18; blood counts remain normal, LDH is wnl -The pt has no prohibitive toxicities from continuing her eighth cycle of maintenance Rituxan at this time.   Rituxan orders reviewed and signed. -The pt shows no clinical or lab progression of her Follicular lymphoma at this time.  -Pt received Flu shot in clinic on 08/14/18 and her prevnar and pneumovax in 2018 -will intend to rpt CT CAP after completion of her maintenance Rituxan unless new lab or clinical concerns prior to that. -Will see the pt back in 2 months with next dose of maintenance Rituxan.   Please schedule next 2 dose of maintenance Rituxan q60 days as ordered with labs and MD visit   All of the patients questions were answered with apparent satisfaction. The patient knows to call the clinic with any problems, questions or concerns.  The total time spent in the appt was 25 minutes and more than 50% was on counseling and direct patient cares.    Sullivan Lone MD [place] [facility]IV[facility] Hematology/Oncology Physician [facility]  (Office):       [phone] (Work cell):  [phone] (Fax):           [phone]  I, Baldwin [place], am acting as a scribe for Dr. Sullivan Lone.   .I have reviewed the above documentation for accuracy and completeness, and I agree with the above. Brunetta Genera MD

## 2018-10-13 ENCOUNTER — Inpatient Hospital Stay: Payer: Medicare Other

## 2018-10-13 ENCOUNTER — Inpatient Hospital Stay: Payer: Medicare Other | Attending: Hematology

## 2018-10-13 ENCOUNTER — Other Ambulatory Visit: Payer: PRIVATE HEALTH INSURANCE

## 2018-10-13 ENCOUNTER — Inpatient Hospital Stay (HOSPITAL_BASED_OUTPATIENT_CLINIC_OR_DEPARTMENT_OTHER): Payer: Medicare Other | Admitting: Hematology

## 2018-10-13 VITALS — BP 157/74 | HR 70 | Temp 98.3°F | Resp 18 | Ht 63.0 in | Wt 200.1 lb

## 2018-10-13 VITALS — BP 140/83 | HR 72 | Temp 98.6°F | Resp 19

## 2018-10-13 DIAGNOSIS — Z7189 Other specified counseling: Secondary | ICD-10-CM

## 2018-10-13 DIAGNOSIS — M25511 Pain in right shoulder: Secondary | ICD-10-CM | POA: Diagnosis not present

## 2018-10-13 DIAGNOSIS — C8248 Follicular lymphoma grade IIIb, lymph nodes of multiple sites: Secondary | ICD-10-CM

## 2018-10-13 DIAGNOSIS — Z5112 Encounter for antineoplastic immunotherapy: Secondary | ICD-10-CM

## 2018-10-13 DIAGNOSIS — I10 Essential (primary) hypertension: Secondary | ICD-10-CM | POA: Diagnosis not present

## 2018-10-13 LAB — CBC WITH DIFFERENTIAL/PLATELET
Abs Immature Granulocytes: 0.01 10*3/uL (ref 0.00–0.07)
Basophils Absolute: 0 10*3/uL (ref 0.0–0.1)
Basophils Relative: 1 %
Eosinophils Absolute: 0.3 10*3/uL (ref 0.0–0.5)
Eosinophils Relative: 5 %
HCT: 36.7 % (ref 36.0–46.0)
Hemoglobin: 12.3 g/dL (ref 12.0–15.0)
Immature Granulocytes: 0 %
Lymphocytes Relative: 21 %
Lymphs Abs: 1.2 10*3/uL (ref 0.7–4.0)
MCH: 30.2 pg (ref 26.0–34.0)
MCHC: 33.5 g/dL (ref 30.0–36.0)
MCV: 90.2 fL (ref 80.0–100.0)
MONO ABS: 0.4 10*3/uL (ref 0.1–1.0)
MONOS PCT: 7 %
Neutro Abs: 3.8 10*3/uL (ref 1.7–7.7)
Neutrophils Relative %: 66 %
Platelets: 228 10*3/uL (ref 150–400)
RBC: 4.07 MIL/uL (ref 3.87–5.11)
RDW: 12.1 % (ref 11.5–15.5)
WBC: 5.7 10*3/uL (ref 4.0–10.5)
nRBC: 0 % (ref 0.0–0.2)

## 2018-10-13 LAB — CMP (CANCER CENTER ONLY)
ALT: 20 U/L (ref 0–44)
AST: 19 U/L (ref 15–41)
Albumin: 4 g/dL (ref 3.5–5.0)
Alkaline Phosphatase: 76 U/L (ref 38–126)
Anion gap: 11 (ref 5–15)
BUN: 13 mg/dL (ref 8–23)
CO2: 23 mmol/L (ref 22–32)
Calcium: 9.6 mg/dL (ref 8.9–10.3)
Chloride: 105 mmol/L (ref 98–111)
Creatinine: 0.77 mg/dL (ref 0.44–1.00)
GFR, Est AFR Am: >60 mL/min (ref >60)
GFR, Estimated: >60 mL/min (ref >60)
GLUCOSE: 151 mg/dL — AB (ref 70–99)
Potassium: 4.3 mmol/L (ref 3.5–5.1)
SODIUM: 139 mmol/L (ref 135–145)
Total Bilirubin: 0.4 mg/dL (ref 0.3–1.2)
Total Protein: 7.3 g/dL (ref 6.5–8.1)

## 2018-10-13 LAB — LACTATE DEHYDROGENASE: LDH: 134 U/L (ref 98–192)

## 2018-10-13 MED ORDER — DIPHENHYDRAMINE HCL 25 MG PO CAPS
50.0000 mg | ORAL_CAPSULE | Freq: Once | ORAL | Status: AC
  Administered 2018-10-13: 50 mg via ORAL

## 2018-10-13 MED ORDER — ACETAMINOPHEN 325 MG PO TABS
ORAL_TABLET | ORAL | Status: AC
  Filled 2018-10-13: qty 2

## 2018-10-13 MED ORDER — ACETAMINOPHEN 325 MG PO TABS
650.0000 mg | ORAL_TABLET | Freq: Once | ORAL | Status: AC
  Administered 2018-10-13: 650 mg via ORAL

## 2018-10-13 MED ORDER — SODIUM CHLORIDE 0.9 % IV SOLN
Freq: Once | INTRAVENOUS | Status: AC
  Administered 2018-10-13: 10:00:00 via INTRAVENOUS
  Filled 2018-10-13: qty 250

## 2018-10-13 MED ORDER — DIPHENHYDRAMINE HCL 25 MG PO CAPS
ORAL_CAPSULE | ORAL | Status: AC
  Filled 2018-10-13: qty 2

## 2018-10-13 MED ORDER — SODIUM CHLORIDE 0.9 % IV SOLN
375.0000 mg/m2 | Freq: Once | INTRAVENOUS | Status: AC
  Administered 2018-10-13: 700 mg via INTRAVENOUS
  Filled 2018-10-13: qty 20

## 2018-10-13 NOTE — Patient Instructions (Signed)
Thank you for choosing [facility] to provide your oncology and hematology care.  To afford each patient quality time with our providers, please arrive 30 minutes before your scheduled appointment time.  If you arrive late for your appointment, you may be asked to reschedule.  We strive to give you quality time with our providers, and arriving late affects you and other patients whose appointments are after yours.    If you are a no show for multiple scheduled visits, you may be dismissed from the clinic at the providers discretion.     Again, thank you for choosing [facility], our hope is that these requests will decrease the amount of time that you wait before being seen by our physicians.  ______________________________________________________________________   Should you have questions after your visit to the [facility], please contact our office at [phone] between the hours of 8:30 and 4:30 p.m.    Voicemails left after 4:30p.m will not be returned until the following business day.     For prescription refill requests, please have your pharmacy contact us directly.  Please also try to allow 48 hours for prescription requests.     Please contact the scheduling department for questions regarding scheduling.  For scheduling of procedures such as PET scans, CT scans, MRI, Ultrasound, etc please contact central scheduling at [phone].     Resources For Cancer Patients and Caregivers:    Oncolink.org:  A wonderful resource for patients and healthcare providers for information regarding your disease, ways to tract your treatment, what to expect, etc.      American Cancer Society:  800-227-2345  Can help patients locate various types of support and financial assistance   Cancer Care: 1-800-813-HOPE (4673) Provides financial assistance, online support groups, medication/co-pay assistance.     [place] DSS:  336-641-3447 Where to apply  for food stamps, Medicaid, and utility assistance   Medicare Rights Center: 800-333-4114 Helps people with Medicare understand their rights and benefits, navigate the Medicare system, and secure the quality healthcare they deserve   SCAT: 336-333-6589 [place] [employer]'s shared-ride transportation service for eligible riders who have a disability that prevents them from riding the fixed route bus.     For additional information on assistance programs please contact our social worker:   Abigail Elmore:  [phone]  

## 2018-10-13 NOTE — Patient Instructions (Signed)
[facility] Discharge Instructions for Patients Receiving Chemotherapy  Today you received the following chemotherapy agents Rituxan   To help prevent nausea and vomiting after your treatment, we encourage you to take your nausea medication as directed.    If you develop nausea and vomiting that is not controlled by your nausea medication, call the clinic.   BELOW ARE SYMPTOMS THAT SHOULD BE REPORTED IMMEDIATELY:  *FEVER GREATER THAN 100.5 F  *CHILLS WITH OR WITHOUT FEVER  NAUSEA AND VOMITING THAT IS NOT CONTROLLED WITH YOUR NAUSEA MEDICATION  *UNUSUAL SHORTNESS OF BREATH  *UNUSUAL BRUISING OR BLEEDING  TENDERNESS IN MOUTH AND THROAT WITH OR WITHOUT PRESENCE OF ULCERS  *URINARY PROBLEMS  *BOWEL PROBLEMS  UNUSUAL RASH Items with * indicate a potential emergency and should be followed up as soon as possible.  Feel free to call the clinic should you have any questions or concerns. The clinic phone number is [phone].  Please show the [place] [facility] at check-in to the Emergency Department and triage nurse.

## 2018-12-11 ENCOUNTER — Telehealth: Payer: Self-pay | Admitting: *Deleted

## 2018-12-11 NOTE — Telephone Encounter (Signed)
Left message regarding COVID-19 screening. Screening questions given - if patient can answer yes to any question, asked to contact office. Advised of current screening at entrance to [facility] and of visitor restrictions.

## 2018-12-11 NOTE — Progress Notes (Signed)
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 female who has been referred to [place] by Dr .Irene Limbo, Cloria Spring, MD for evaluation and management of suspected non-Hodgkin's lymphoma.  Patient notes that she presented with back pain for 4-6 weeks and eventually had pain radiating to her right inguinal area. She notes that she has had interstitial cystitis in the past. She was thought to have a urinary tract infection and was treated with Macrodantin. Says the pain got worse and started radiating into the right groin she had a CT of the abdomen renal stone protocol on 10/25/2016 which showed extensive retroperitoneal lymphadenopathy concerning for lymphoproliferative disorder. She was incidentally noted to have a nodular border of the liver with possible liver cirrhosis. No urinary stones noted.  She subsequently had a PET CT scan ordered by her primary care physician which was done on 11/07/2016. This showed diffuse hypermetabolic spleen with mild splenomegaly. Hypermetabolic adenopathy in the left supraclavicular, bilateral axillary, peripancreatic, gastrohepatic ligament, celiac, porta hepatis, periaortic, and left external iliac chains. Hypermetabolic mesenteric lymph nodes. Appearance favors lymphoma.  Patient notes some night sweats for 2 weeks. Has lost about 10 pounds in the last month. No acute fevers or chills. Notes the back pain is controlled with when necessary Vicodin.  INTERVAL HISTORY  Ms Katelyn Reeves is here for her scheduled follow-up regarding her follicular lymphoma and ninth maintenance dose of Rituxan. The patient's last visit with [place] was on 10/13/18. The pt reports that she is doing well overall.   The  pt reports that she has been self-isolating since March 6, and denies developing any concerns for infections. She has not developed any new symptoms or concerns. She continues endorsing good appetite, eating well, and weight gain.  Lab results today (12/14/18) of CBC w/diff and CMP is as follows: all values are WNL except for CO2 at 21, Glucose at 140, GFR at 57. 12/14/18 LDH is . Lab Results  Component Value Date   LDH 179 12/14/2018   On review of systems, pt reports good energy levels, eating well, good appetite, weight gain, and denies concerns for infections, and any other symptoms.   MEDICAL HISTORY:  #1 fibrocystic disease of the breast #2 hypertension #3 constipation #4 duplicated right sided ureters #5 imaging concern for possible liver cirrhosis  SURGICAL HISTORY:  #1 total abdominal hysterectomy with bilateral salpingo-oophorectomy for ovarian cyst. #2 Lipoma removal left upper back #3 hemorrhoidectomy  SOCIAL HISTORY: Social History   Socioeconomic History   Marital status: Divorced    Spouse name: Not on file   Number of children: Not on file   Years of education: Not on file   Highest education level: Not on file  Occupational History   Not on file  Social Needs   Financial resource strain: Not on file   Food insecurity:    Worry: Not on file    Inability: Not on file   Transportation needs:    Medical: Not on file    Non-medical: Not on file  Tobacco Use   Smoking status: Never Smoker   Smokeless tobacco: Never Used  Substance and Sexual Activity   Alcohol use: No   Drug use: No   Sexual activity: Not  on file  Lifestyle   Physical activity:    Days per week: Not on file    Minutes per session: Not on file   Stress: Not on file  Relationships   Social connections:    Talks on phone: Not on file    Gets together: Not on file    Attends religious service: Not on file    Active member of club or organization: Not on file    Attends  meetings of clubs or organizations: Not on file    Relationship status: Not on file   Intimate partner violence:    Fear of current or ex partner: Not on file    Emotionally abused: Not on file    Physically abused: Not on file    Forced sexual activity: Not on file  Other Topics Concern   Not on file  Social History Narrative   Not on file  Nonsmoker no issues with alcohol use or drug use. 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  FAMILY HISTORY:  Notes her mother had ovarian cancer followed by leukemia and died at age 17 years Maternal aunt gastric cancer  ALLERGIES:  is allergic to sulfa antibiotics.  MEDICATIONS:  Current Outpatient Medications  Medication Sig Dispense Refill   HYDROcodone-acetaminophen (NORCO/VICODIN) 5-325 MG tablet as needed.  0   lidocaine-prilocaine (EMLA) cream Apply to port-a-cath 1 hr prior to treatment/lab draw 30 g 0   traMADol (ULTRAM) 50 MG tablet Take 1 tablet (50 mg total) by mouth every 6 (six) hours as needed. (Patient not taking: Reported on 05/28/2018) 30 tablet 0   No current facility-administered medications for this visit.    Facility-Administered Medications Ordered in Other Visits  Medication Dose Route Frequency Provider Last Rate Last Dose   sodium chloride flush (NS) 0.9 % injection 10 mL  10 mL Intracatheter PRN Brunetta Genera, MD   10 mL at 07/04/17 1540    REVIEW OF SYSTEMS:    A 10+ POINT REVIEW OF SYSTEMS WAS OBTAINED including neurology, dermatology, psychiatry, cardiac, respiratory, lymph, extremities, GI, GU, Musculoskeletal, constitutional, breasts, reproductive, HEENT.  All pertinent positives are noted in the HPI.  All others are negative.   PHYSICAL EXAMINATION: ECOG PERFORMANCE STATUS: 1 - Symptomatic but completely ambulatory  Vitals:   12/14/18 0925  BP: 115/84  Pulse: 77  Resp: 18  Temp: 97.7 F (36.5 C)  SpO2: 99%   Filed Weights    12/14/18 0925  Weight: 200 lb 4.8 oz (90.9 kg)   .Body mass index is 35.48 kg/m.  GENERAL:alert, in no acute distress and comfortable SKIN: no acute rashes, no significant lesions EYES: conjunctiva are pink and non-injected, sclera anicteric OROPHARYNX: MMM, no exudates, no oropharyngeal erythema or ulceration NECK: supple, no JVD LYMPH:  no palpable lymphadenopathy in the cervical, axillary or inguinal regions LUNGS: clear to auscultation b/l with normal respiratory effort HEART: regular rate & rhythm ABDOMEN:  normoactive bowel sounds , non tender, not distended. No palpable hepatosplenomegaly.  Extremity: no pedal edema PSYCH: alert & oriented x 3 with fluent speech NEURO: no focal motor/sensory deficits   LABORATORY DATA:  I have reviewed the data as listed  . CBC Latest Ref Rng & Units 12/14/2018 10/13/2018 10/07/2018  WBC 4.0 - 10.5 K/uL 5.3 5.7 4.9  Hemoglobin 12.0 - 15.0 g/dL 12.7 12.3 12.4  Hematocrit 36.0 - 46.0 % 37.3 36.7 37.4  Platelets 150 - 400 K/uL 213 228 226   CBC  Component Value Date/Time   WBC 5.3 12/14/2018 0859   RBC 4.15 12/14/2018 0859   HGB 12.7 12/14/2018 0859   HGB 13.4 05/28/2018 1054   HGB 12.6 09/04/2017 1006   HCT 37.3 12/14/2018 0859   HCT 36.6 09/04/2017 1006   PLT 213 12/14/2018 0859   PLT 248 05/28/2018 1054   PLT 222 09/04/2017 1006   MCV 89.9 12/14/2018 0859   MCV 88.4 09/04/2017 1006   MCH 30.6 12/14/2018 0859   MCHC 34.0 12/14/2018 0859   RDW 12.3 12/14/2018 0859   RDW 13.1 09/04/2017 1006   LYMPHSABS 1.3 12/14/2018 0859   LYMPHSABS 1.1 09/04/2017 1006   MONOABS 0.4 12/14/2018 0859   MONOABS 0.3 09/04/2017 1006   EOSABS 0.2 12/14/2018 0859   EOSABS 0.2 09/04/2017 1006   BASOSABS 0.1 12/14/2018 0859   BASOSABS 0.0 09/04/2017 1006   . CMP Latest Ref Rng & Units 12/14/2018 10/13/2018 08/14/2018  Glucose 70 - 99 mg/dL 140(H) 151(H) 120(H)  BUN 8 - 23 mg/dL 10 13 12   Creatinine 0.44 - 1.00 mg/dL 1.00 0.77 0.83  Sodium 135 -  145 mmol/L 141 139 142  Potassium 3.5 - 5.1 mmol/L 3.8 4.3 4.1  Chloride 98 - 111 mmol/L 109 105 108  CO2 22 - 32 mmol/L 21(L) 23 24  Calcium 8.9 - 10.3 mg/dL 9.2 9.6 9.5  Total Protein 6.5 - 8.1 g/dL 7.5 7.3 7.3  Total Bilirubin 0.3 - 1.2 mg/dL 0.5 0.4 0.5  Alkaline Phos 38 - 126 U/L 67 76 76  AST 15 - 41 U/L 24 19 21   ALT 0 - 44 U/L 25 20 17    . Lab Results  Component Value Date   LDH 134 10/13/2018   Component     Latest Ref Rng & Units 11/12/2016  Hep C Virus Ab     0.0 - 0.9 s/co ratio <0.1  Hepatitis B Surface Ag     Negative Negative  Hep B Core Ab, Tot     Negative Negative       RADIOGRAPHIC STUDIES: I have personally reviewed the radiological images as listed and agreed with the findings in the report. No results found.  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  Presented with Generalized FDG avid lymphadenopathy - predominantly in the abdomen/Retroperitoneum. Also noted to have left supraclavicular and bilateral axillary left more than right FDG avid lymphadenopathy. Based on imaging this would represent at least Stage III disease if this were a lymphoma. LDH level is not significantly elevated. Blood counts are stable. PET/CT scan does show fairly active disease which is FDG avid LNadenopathy and FDG avid splenomegaly. Patient has some constitutional symptoms with about a 10 pound weight loss and some night sweats. Hepatitis profile negative. No other obvious new focal symptoms.  Patient has had an ECHO which shows nl EF  Patient is status post 6 cycles of R CHOP with no prohibitive toxicities.  PET/CT on 8/72018 - showed complete metabolic response with no metabolically active disease. A few borderline lymph nodes in the retroperitoneum.  CT chest/abd/pelvis 11/04/2017:  No new or progressive  findings to suggest recurrent disease. The small abdominal retroperitoneal lymph nodes are unchanged when comparing to PET-CT of 04/29/2017.   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 Improved with massage therapy  #4 h/o mild shingles  PLAN:  -Discussed pt labwork today, 12/14/18; blood counts and chemistries are stable -The pt has no prohibitive toxicities from continuing her ninth maintenance dose of Rituxan at this time.  -Will decrease pre-medication Benadryl to 18m as pt has not had any problems tolerating Rituxan and notes significant sedationand desires to cut down benadryl dose. -The pt shows no clinical or lab progression of her Follicular lymphoma at this time. -Pt received Flu shot in clinic on 08/14/18 and her prevnar and pneumovax in 2018 -Will intend to rpt CT CAP after completion of her maintenance Rituxan unless new lab or clinical concerns prior to that. -Will see the pt back in 2 months   RTC as per appointment in may 2020 for next dose of maintenance Rituxan, labs and MD visit   All of the patients questions were answered with apparent satisfaction. The patient knows to call the clinic with any problems, questions or concerns.  The total time spent in the appt was 25 minutes and more than 50% was on counseling and direct patient cares.    GSullivan LoneMD MShannondaleAAHIVMS SPacific Orange Hospital, LLCCNorth Ms Medical Center - IukaHematology/Oncology Physician CAssociated Eye Surgical Center LLC (Office):       [phone](Work cell):  [phone](Fax):           [phone] I, SBaldwin Jamaica am acting as a scribe for Dr. GSullivan Lone   .I have reviewed the above documentation for accuracy and completeness, and I agree with the above. .Brunetta GeneraMD

## 2018-12-14 ENCOUNTER — Other Ambulatory Visit: Payer: Self-pay

## 2018-12-14 ENCOUNTER — Inpatient Hospital Stay: Payer: Medicare Other | Attending: Hematology | Admitting: Hematology

## 2018-12-14 ENCOUNTER — Telehealth: Payer: Self-pay | Admitting: Hematology

## 2018-12-14 ENCOUNTER — Inpatient Hospital Stay: Payer: Medicare Other

## 2018-12-14 VITALS — BP 143/71 | HR 78 | Temp 98.6°F | Resp 18

## 2018-12-14 VITALS — BP 115/84 | HR 77 | Temp 97.7°F | Resp 18 | Ht 63.0 in | Wt 200.3 lb

## 2018-12-14 DIAGNOSIS — Z5112 Encounter for antineoplastic immunotherapy: Secondary | ICD-10-CM

## 2018-12-14 DIAGNOSIS — C8248 Follicular lymphoma grade IIIb, lymph nodes of multiple sites: Secondary | ICD-10-CM | POA: Diagnosis not present

## 2018-12-14 DIAGNOSIS — Z79899 Other long term (current) drug therapy: Secondary | ICD-10-CM

## 2018-12-14 DIAGNOSIS — M25511 Pain in right shoulder: Secondary | ICD-10-CM | POA: Diagnosis not present

## 2018-12-14 DIAGNOSIS — Z7189 Other specified counseling: Secondary | ICD-10-CM

## 2018-12-14 LAB — CBC WITH DIFFERENTIAL/PLATELET
Abs Immature Granulocytes: 0.02 10*3/uL (ref 0.00–0.07)
BASOS ABS: 0.1 10*3/uL (ref 0.0–0.1)
Basophils Relative: 1 %
Eosinophils Absolute: 0.2 10*3/uL (ref 0.0–0.5)
Eosinophils Relative: 4 %
HCT: 37.3 % (ref 36.0–46.0)
Hemoglobin: 12.7 g/dL (ref 12.0–15.0)
Immature Granulocytes: 0 %
Lymphocytes Relative: 25 %
Lymphs Abs: 1.3 10*3/uL (ref 0.7–4.0)
MCH: 30.6 pg (ref 26.0–34.0)
MCHC: 34 g/dL (ref 30.0–36.0)
MCV: 89.9 fL (ref 80.0–100.0)
Monocytes Absolute: 0.4 10*3/uL (ref 0.1–1.0)
Monocytes Relative: 8 %
Neutro Abs: 3.3 10*3/uL (ref 1.7–7.7)
Neutrophils Relative %: 62 %
Platelets: 213 10*3/uL (ref 150–400)
RBC: 4.15 MIL/uL (ref 3.87–5.11)
RDW: 12.3 % (ref 11.5–15.5)
WBC: 5.3 10*3/uL (ref 4.0–10.5)
nRBC: 0 % (ref 0.0–0.2)

## 2018-12-14 LAB — CMP (CANCER CENTER ONLY)
ALT: 25 U/L (ref 0–44)
ANION GAP: 11 (ref 5–15)
AST: 24 U/L (ref 15–41)
Albumin: 4.1 g/dL (ref 3.5–5.0)
Alkaline Phosphatase: 67 U/L (ref 38–126)
BUN: 10 mg/dL (ref 8–23)
CO2: 21 mmol/L — ABNORMAL LOW (ref 22–32)
Calcium: 9.2 mg/dL (ref 8.9–10.3)
Chloride: 109 mmol/L (ref 98–111)
Creatinine: 1 mg/dL (ref 0.44–1.00)
GFR, Est AFR Am: >60 mL/min (ref >60)
GFR, Estimated: 57 mL/min — ABNORMAL LOW (ref >60)
Glucose, Bld: 140 mg/dL — ABNORMAL HIGH (ref 70–99)
Potassium: 3.8 mmol/L (ref 3.5–5.1)
Sodium: 141 mmol/L (ref 135–145)
Total Bilirubin: 0.5 mg/dL (ref 0.3–1.2)
Total Protein: 7.5 g/dL (ref 6.5–8.1)

## 2018-12-14 LAB — LACTATE DEHYDROGENASE: LDH: 179 U/L (ref 98–192)

## 2018-12-14 MED ORDER — ACETAMINOPHEN 325 MG PO TABS
ORAL_TABLET | ORAL | Status: AC
  Filled 2018-12-14: qty 2

## 2018-12-14 MED ORDER — DIPHENHYDRAMINE HCL 25 MG PO CAPS
50.0000 mg | ORAL_CAPSULE | Freq: Once | ORAL | Status: AC
  Administered 2018-12-14: 25 mg via ORAL

## 2018-12-14 MED ORDER — SODIUM CHLORIDE 0.9 % IV SOLN
375.0000 mg/m2 | Freq: Once | INTRAVENOUS | Status: AC
  Administered 2018-12-14: 700 mg via INTRAVENOUS
  Filled 2018-12-14: qty 20

## 2018-12-14 MED ORDER — SODIUM CHLORIDE 0.9 % IV SOLN
Freq: Once | INTRAVENOUS | Status: AC
  Administered 2018-12-14: 12:00:00 via INTRAVENOUS
  Filled 2018-12-14: qty 250

## 2018-12-14 MED ORDER — DIPHENHYDRAMINE HCL 25 MG PO CAPS
ORAL_CAPSULE | ORAL | Status: AC
  Filled 2018-12-14: qty 2

## 2018-12-14 MED ORDER — ACETAMINOPHEN 325 MG PO TABS
650.0000 mg | ORAL_TABLET | Freq: Once | ORAL | Status: AC
  Administered 2018-12-14: 650 mg via ORAL

## 2018-12-14 NOTE — Patient Instructions (Addendum)
[facility] Discharge Instructions for Patients Receiving Chemotherapy  Today you received the following chemotherapy agents: Rituxan.  To help prevent nausea and vomiting after your treatment, we encourage you to take your nausea medication as directed  If you develop nausea and vomiting that is not controlled by your nausea medication, call the clinic.   BELOW ARE SYMPTOMS THAT SHOULD BE REPORTED IMMEDIATELY:  *FEVER GREATER THAN 100.5 F  *CHILLS WITH OR WITHOUT FEVER  NAUSEA AND VOMITING THAT IS NOT CONTROLLED WITH YOUR NAUSEA MEDICATION  *UNUSUAL SHORTNESS OF BREATH  *UNUSUAL BRUISING OR BLEEDING  TENDERNESS IN MOUTH AND THROAT WITH OR WITHOUT PRESENCE OF ULCERS  *URINARY PROBLEMS  *BOWEL PROBLEMS  UNUSUAL RASH Items with * indicate a potential emergency and should be followed up as soon as possible.  Feel free to call the clinic should you have any questions or concerns. The clinic phone number is [phone].  Please show the [facility] at check-in to the Emergency Department and triage nurse.  Coronavirus (COVID-19) Are you at risk?  Are you at risk for the Coronavirus (COVID-19)?  To be considered HIGH RISK for Coronavirus (COVID-19), you have to meet the following criteria:  . Traveled to [place], [place], [place], [place] or [place]; or in the [place] to [place], [place], [place], or [place]; and have fever, cough, and shortness of breath within the last 2 weeks of travel OR . Been in close contact with a person diagnosed with COVID-19 within the last 2 weeks and have fever, cough, and shortness of breath . IF YOU DO NOT MEET THESE CRITERIA, YOU ARE CONSIDERED LOW RISK FOR COVID-19.  What to do if you are HIGH RISK for COVID-19?  Marland Kitchen If you are having a medical emergency, call 911. . Seek medical care right away. Before you go to a doctor's office, urgent care or emergency department, call ahead and tell them about your recent  travel, contact with someone diagnosed with COVID-19, and your symptoms. You should receive instructions from your physician's office regarding next steps of care.  . When you arrive at healthcare provider, tell the healthcare staff immediately you have returned from visiting [place], [place], [place], [place] or [place]; or traveled in the [place] to [place], [place], [place], or [place]; in the last two weeks or you have been in close contact with a person diagnosed with COVID-19 in the last 2 weeks.   . Tell the health care staff about your symptoms: fever, cough and shortness of breath. . After you have been seen by a medical provider, you will be either: o Tested for (COVID-19) and discharged home on quarantine except to seek medical care if symptoms worsen, and asked to  - Stay home and avoid contact with others until you get your results (4-5 days)  - Avoid travel on public transportation if possible (such as bus, train, or airplane) or o Sent to the Emergency Department by EMS for evaluation, COVID-19 testing, and possible admission depending on your condition and test results.  What to do if you are LOW RISK for COVID-19?  Reduce your risk of any infection by using the same precautions used for avoiding the common cold or flu:  Marland Kitchen Wash your hands often with soap and warm water for at least 20 seconds.  If soap and water are not readily available, use an alcohol-based hand sanitizer with at least 60% alcohol.  . If coughing or sneezing, cover  your mouth and nose by coughing or sneezing into the elbow areas of your shirt or coat, into a tissue or into your sleeve (not your hands). . Avoid shaking hands with others and consider head nods or verbal greetings only. . Avoid touching your eyes, nose, or mouth with unwashed hands.  . Avoid close contact with people who are sick. . Avoid places or events with large numbers of people in one location, like concerts or sporting  events. . Carefully consider travel plans you have or are making. . If you are planning any travel outside or inside the [place], visit the CDC's Travelers' Health webpage for the latest health notices. . If you have some symptoms but not all symptoms, continue to monitor at home and seek medical attention if your symptoms worsen. . If you are having a medical emergency, call 911.   [place] / e-Visit: [email or web address]         [facility]: [facility]: [phone]                   [facility]: [phone]

## 2018-12-14 NOTE — Telephone Encounter (Signed)
Per 3/23 los.  Appts already scheduled.

## 2019-02-11 NOTE — Progress Notes (Signed)
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 female who has been referred to [place] by Dr .Irene Limbo, [place]oria [place], [place] for evaluation and management of suspected non-Hodgkin's lymphoma.  Patient notes that she presented with back pain for 4-6 weeks and eventually had pain radiating to her right inguinal area. She notes that she has had interstitial cystitis in the past. She was thought to have a urinary tract infection and was treated with Macrodantin. Says the pain got worse and started radiating into the right groin she had a CT of the abdomen renal stone protocol on 10/25/2016 which showed extensive retroperitoneal lymphadenopathy concerning for lymphoproliferative disorder. She was incidentally noted to have a nodular border of the liver with possible liver cirrhosis. No urinary stones noted.  She subsequently had a PET CT scan ordered by her primary care physician which was done on 11/07/2016. This showed diffuse hypermetabolic spleen with mild splenomegaly. Hypermetabolic adenopathy in the left supraclavicular, bilateral axillary, peripancreatic, gastrohepatic ligament, celiac, porta hepatis, periaortic, and left external iliac chains. Hypermetabolic mesenteric lymph nodes. Appearance favors lymphoma.  Patient notes some night sweats for 2 weeks. Has lost about 10 pounds in the last month. No acute fevers or chills. Notes the back pain is controlled with when necessary Vicodin.  INTERVAL HISTORY  Katelyn Reeves is here for her scheduled follow-up regarding her follicular lymphoma and tenth maintenance dose of Rituxan. The patient's last visit with [place] was on 12/14/18. The pt reports that she is doing well overall.  The  pt reports that she continues to have some lower right sided back pain, treats with topical biofreeze. Endorses pain when lying on right hip, alleviated by sleeping with a pillow between legs. Other than this, the pt notes that she has not developed any new concerns and has been sheltering in place. She denies any concerns for infections at this time.  Lab results today (02/12/19) of CBC w/diff and CMP is as follows: all values are WNL except for Glucose at 127. 02/12/19 LDH at 133  On review of systems, pt reports lower back discomfort, good energy levels, and denies concerns for infections, fevers, chills, noticing any new lumps or bumps, and any other symptoms.   MEDICAL HISTORY:  #1 fibrocystic disease of the breast #2 hypertension #3 constipation #4 duplicated right sided ureters #5 imaging concern for possible liver cirrhosis  SURGICAL HISTORY:  #1 total abdominal hysterectomy with bilateral salpingo-oophorectomy for ovarian cyst. #2 Lipoma removal left upper back #3 hemorrhoidectomy  SOCIAL HISTORY: Social History   Socioeconomic History   Marital status: Divorced    Spouse name: Not on file   Number of children: Not on file   Years of education: Not on file   Highest education level: Not on file  Occupational History   Not on file  Social Needs   Financial resource strain: Not on file   Food insecurity:    Worry: Not on file    Inability: Not on file   Transportation needs:    Medical: Not on file    Non-medical: Not on file  Tobacco Use   Smoking status: Never Smoker   Smokeless tobacco: Never Used  Substance and Sexual Activity   Alcohol use:  No   Drug use: No   Sexual activity: Not on file  Lifestyle   Physical activity:    Days per week: Not on file    Minutes per session: Not on file   Stress: Not on file  Relationships   Social connections:    Talks on phone: Not on file    Gets together: Not on file    Attends religious service: Not  on file    Active member of club or organization: Not on file    Attends meetings of clubs or organizations: Not on file    Relationship status: Not on file   Intimate partner violence:    Fear of current or ex partner: Not on file    Emotionally abused: Not on file    Physically abused: Not on file    Forced sexual activity: Not on file  Other Topics Concern   Not on file  Social History Narrative   Not on file  Nonsmoker no issues with alcohol use or drug use. 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  FAMILY HISTORY:  Notes her mother had ovarian cancer followed by leukemia and died at age 86 years Maternal aunt gastric cancer  ALLERGIES:  is allergic to sulfa antibiotics.  MEDICATIONS:  Current Outpatient Medications  Medication Sig Dispense Refill   HYDROcodone-acetaminophen (NORCO/VICODIN) 5-325 MG tablet as needed.  0   lidocaine-prilocaine (EMLA) cream Apply to port-a-cath 1 hr prior to treatment/lab draw 30 g 0   traMADol (ULTRAM) 50 MG tablet Take 1 tablet (50 mg total) by mouth every 6 (six) hours as needed. (Patient not taking: Reported on 05/28/2018) 30 tablet 0   No current facility-administered medications for this visit.    Facility-Administered Medications Ordered in Other Visits  Medication Dose Route Frequency Provider Last Rate Last Dose   sodium chloride flush (NS) 0.9 % injection 10 mL  10 mL Intracatheter PRN Brunetta Genera, MD   10 mL at 07/04/17 1540    REVIEW OF SYSTEMS:    A 10+ POINT REVIEW OF SYSTEMS WAS OBTAINED including neurology, dermatology, psychiatry, cardiac, respiratory, lymph, extremities, GI, GU, Musculoskeletal, constitutional, breasts, reproductive, HEENT.  All pertinent positives are noted in the HPI.  All others are negative.   PHYSICAL EXAMINATION: ECOG PERFORMANCE STATUS: 1 - Symptomatic but completely ambulatory  Vitals:   02/12/19 1026  BP: 140/80    Pulse: 72  Resp: 17  Temp: (!) 97.2 F (36.2 C)  SpO2: 99%   Filed Weights   02/12/19 1026  Weight: 191 lb (86.6 kg)   .Body mass index is 33.83 kg/m.  GENERAL:alert, in no acute distress and comfortable SKIN: no acute rashes, no significant lesions EYES: conjunctiva are pink and non-injected, sclera anicteric OROPHARYNX: MMM, no exudates, no oropharyngeal erythema or ulceration NECK: supple, no JVD LYMPH:  no palpable lymphadenopathy in the cervical, axillary or inguinal regions LUNGS: clear to auscultation b/l with normal respiratory effort HEART: regular rate & rhythm ABDOMEN:  normoactive bowel sounds , non tender, not distended. No palpable hepatosplenomegaly.  Extremity: no pedal edema PSYCH: alert & oriented x 3 with fluent speech NEURO: no focal motor/sensory deficits   LABORATORY DATA:  I have reviewed the data as listed  . CBC Latest Ref Rng & Units 02/12/2019 12/14/2018 10/13/2018  WBC 4.0 - 10.5 K/uL 4.8 5.3 5.7  Hemoglobin 12.0 - 15.0 g/dL 13.5 12.7 12.3  Hematocrit 36.0 - 46.0 % 39.4 37.3 36.7  Platelets 150 - 400 K/uL 232 213 228   CBC    Component Value Date/Time   WBC 4.8 02/12/2019 0859   RBC 4.38 02/12/2019 0859   HGB 13.5 02/12/2019 0859   HGB 13.4 05/28/2018 1054   HGB 12.6 09/04/2017 1006   HCT 39.4 02/12/2019 0859   HCT 36.6 09/04/2017 1006   PLT 232 02/12/2019 0859   PLT 248 05/28/2018 1054   PLT 222 09/04/2017 1006   MCV 90.0 02/12/2019 0859   MCV 88.4 09/04/2017 1006   MCH 30.8 02/12/2019 0859   MCHC 34.3 02/12/2019 0859   RDW 11.9 02/12/2019 0859   RDW 13.1 09/04/2017 1006   LYMPHSABS 1.2 02/12/2019 0859   LYMPHSABS 1.1 09/04/2017 1006   MONOABS 0.4 02/12/2019 0859   MONOABS 0.3 09/04/2017 1006   EOSABS 0.2 02/12/2019 0859   EOSABS 0.2 09/04/2017 1006   BASOSABS 0.1 02/12/2019 0859   BASOSABS 0.0 09/04/2017 1006   . CMP Latest Ref Rng & Units 02/12/2019 12/14/2018 10/13/2018  Glucose 70 - 99 mg/dL 127(H) 140(H) 151(H)  BUN 8 -  23 mg/dL 9 10 13   Creatinine 0.44 - 1.00 mg/dL 0.78 1.00 0.77  Sodium 135 - 145 mmol/L 140 141 139  Potassium 3.5 - 5.1 mmol/L 4.3 3.8 4.3  Chloride 98 - 111 mmol/L 106 109 105  CO2 22 - 32 mmol/L 23 21(L) 23  Calcium 8.9 - 10.3 mg/dL 9.6 9.2 9.6  Total Protein 6.5 - 8.1 g/dL 7.6 7.5 7.3  Total Bilirubin 0.3 - 1.2 mg/dL 0.3 0.5 0.4  Alkaline Phos 38 - 126 U/L 91 67 76  AST 15 - 41 U/L 22 24 19   ALT 0 - 44 U/L 18 25 20    . Lab Results  Component Value Date   LDH 133 02/12/2019   Component     Latest Ref Rng & Units 11/12/2016  Hep C Virus Ab     0.0 - 0.9 s/co ratio <0.1  Hepatitis B Surface Ag     Negative Negative  Hep B Core Ab, Tot     Negative Negative       RADIOGRAPHIC STUDIES: I have personally reviewed the radiological images as listed and agreed with the findings in the report. No results found.  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  Presented with Generalized FDG avid lymphadenopathy - predominantly in the abdomen/Retroperitoneum. Also noted to have left supraclavicular and bilateral axillary left more than right FDG avid lymphadenopathy. Based on imaging this would represent at least Stage III disease if this were a lymphoma. LDH level is not significantly elevated. Blood counts are stable. PET/CT scan does show fairly active disease which is FDG avid LNadenopathy and FDG avid splenomegaly. Patient has some constitutional symptoms with about a 10 pound weight loss and some night sweats. Hepatitis profile negative. No other obvious new focal symptoms.  Patient has had an ECHO which shows nl EF  Patient is status post 6 cycles of R CHOP with no prohibitive toxicities.  PET/CT on 8/72018 - showed complete metabolic response with no metabolically active disease. A few borderline lymph nodes in  the retroperitoneum.  CT chest/abd/pelvis 11/04/2017:  No new or progressive findings to suggest recurrent disease. The small abdominal retroperitoneal lymph nodes are unchanged when comparing to PET-CT of 04/29/2017.   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 Improved with massage therapy  #4 h/o mild shingles  PLAN:  -Discussed pt labwork today, 02/12/19; blood counts continue to be normal, chemistries are stable. LDH normal at 133. -The pt has no prohibitive toxicities from continuing her tenth cycle of maintenance Rituxan at this time. -The pt shows no clinical or lab progression of her Follicular Lymphoma at this time. -Did decrease pre-medication Benadryl to 68m as pt has not had any problems tolerating Rituxan and notes significant sedationand desires to cut down benadryl dose. -Pt received Flu shot in clinic on 08/14/18 and her prevnar and pneumovax in 2018 -Will intend to rpt CT CAP after completion of her maintenance Rituxan unless new lab or clinical concerns prior to that. -Will see the pt back in 60 days   Please schedule next 2 cycles of maintenance Rituxan q60days as ordered with labs and MD visit   All of the patients questions were answered with apparent satisfaction. The patient knows to call the clinic with any problems, questions or concerns.  The total time spent in the appt was 25 minutes and more than 50% was on counseling and direct patient cares.    GSullivan LoneMD MCalvinAAHIVMS SDigest[facility] Th[facility]aksCE[facility] LLCHematology/Oncology Physician [facility] (Office):       [phone](Work cell):  [phone](Fax):           [phone] I, SBaldwin Jamaica am acting as a scribe for Dr. GSullivan Lone   .I have reviewed the above documentation for accuracy and completeness, and I agree with the above. GBrunetta GeneraMD

## 2019-02-12 ENCOUNTER — Inpatient Hospital Stay: Payer: Medicare Other

## 2019-02-12 ENCOUNTER — Other Ambulatory Visit: Payer: Self-pay

## 2019-02-12 ENCOUNTER — Inpatient Hospital Stay: Payer: Medicare Other | Attending: Hematology | Admitting: Hematology

## 2019-02-12 VITALS — BP 149/79 | HR 66 | Temp 98.4°F | Resp 18

## 2019-02-12 VITALS — BP 140/80 | HR 72 | Temp 97.2°F | Resp 17 | Ht 63.0 in | Wt 191.0 lb

## 2019-02-12 DIAGNOSIS — Z5112 Encounter for antineoplastic immunotherapy: Secondary | ICD-10-CM | POA: Insufficient documentation

## 2019-02-12 DIAGNOSIS — C8248 Follicular lymphoma grade IIIb, lymph nodes of multiple sites: Secondary | ICD-10-CM | POA: Diagnosis not present

## 2019-02-12 DIAGNOSIS — M545 Low back pain: Secondary | ICD-10-CM | POA: Diagnosis not present

## 2019-02-12 DIAGNOSIS — M25511 Pain in right shoulder: Secondary | ICD-10-CM | POA: Diagnosis not present

## 2019-02-12 DIAGNOSIS — Z7189 Other specified counseling: Secondary | ICD-10-CM

## 2019-02-12 DIAGNOSIS — Z79899 Other long term (current) drug therapy: Secondary | ICD-10-CM

## 2019-02-12 DIAGNOSIS — M25551 Pain in right hip: Secondary | ICD-10-CM

## 2019-02-12 LAB — CBC WITH DIFFERENTIAL/PLATELET
Abs Immature Granulocytes: 0.01 10*3/uL (ref 0.00–0.07)
Basophils Absolute: 0.1 10*3/uL (ref 0.0–0.1)
Basophils Relative: 1 %
Eosinophils Absolute: 0.2 10*3/uL (ref 0.0–0.5)
Eosinophils Relative: 4 %
HCT: 39.4 % (ref 36.0–46.0)
Hemoglobin: 13.5 g/dL (ref 12.0–15.0)
Immature Granulocytes: 0 %
Lymphocytes Relative: 26 %
Lymphs Abs: 1.2 10*3/uL (ref 0.7–4.0)
MCH: 30.8 pg (ref 26.0–34.0)
MCHC: 34.3 g/dL (ref 30.0–36.0)
MCV: 90 fL (ref 80.0–100.0)
Monocytes Absolute: 0.4 10*3/uL (ref 0.1–1.0)
Monocytes Relative: 8 %
Neutro Abs: 2.9 10*3/uL (ref 1.7–7.7)
Neutrophils Relative %: 61 %
Platelets: 232 10*3/uL (ref 150–400)
RBC: 4.38 MIL/uL (ref 3.87–5.11)
RDW: 11.9 % (ref 11.5–15.5)
WBC: 4.8 10*3/uL (ref 4.0–10.5)
nRBC: 0 % (ref 0.0–0.2)

## 2019-02-12 LAB — CMP (CANCER CENTER ONLY)
ALT: 18 U/L (ref 0–44)
AST: 22 U/L (ref 15–41)
Albumin: 4.1 g/dL (ref 3.5–5.0)
Alkaline Phosphatase: 91 U/L (ref 38–126)
Anion gap: 11 (ref 5–15)
BUN: 9 mg/dL (ref 8–23)
CO2: 23 mmol/L (ref 22–32)
Calcium: 9.6 mg/dL (ref 8.9–10.3)
Chloride: 106 mmol/L (ref 98–111)
Creatinine: 0.78 mg/dL (ref 0.44–1.00)
GFR, Est AFR Am: >60 mL/min (ref >60)
GFR, Estimated: >60 mL/min (ref >60)
Glucose, Bld: 127 mg/dL — ABNORMAL HIGH (ref 70–99)
Potassium: 4.3 mmol/L (ref 3.5–5.1)
Sodium: 140 mmol/L (ref 135–145)
Total Bilirubin: 0.3 mg/dL (ref 0.3–1.2)
Total Protein: 7.6 g/dL (ref 6.5–8.1)

## 2019-02-12 LAB — LACTATE DEHYDROGENASE: LDH: 133 U/L (ref 98–192)

## 2019-02-12 MED ORDER — SODIUM CHLORIDE 0.9 % IV SOLN
375.0000 mg/m2 | Freq: Once | INTRAVENOUS | Status: AC
  Administered 2019-02-12: 700 mg via INTRAVENOUS
  Filled 2019-02-12: qty 20

## 2019-02-12 MED ORDER — ACETAMINOPHEN 325 MG PO TABS
ORAL_TABLET | ORAL | Status: AC
  Filled 2019-02-12: qty 2

## 2019-02-12 MED ORDER — DIPHENHYDRAMINE HCL 25 MG PO CAPS
25.0000 mg | ORAL_CAPSULE | Freq: Once | ORAL | Status: AC
  Administered 2019-02-12: 25 mg via ORAL

## 2019-02-12 MED ORDER — ACETAMINOPHEN 325 MG PO TABS
650.0000 mg | ORAL_TABLET | Freq: Once | ORAL | Status: AC
  Administered 2019-02-12: 650 mg via ORAL

## 2019-02-12 MED ORDER — DIPHENHYDRAMINE HCL 25 MG PO CAPS
ORAL_CAPSULE | ORAL | Status: AC
  Filled 2019-02-12: qty 1

## 2019-02-12 MED ORDER — SODIUM CHLORIDE 0.9 % IV SOLN
Freq: Once | INTRAVENOUS | Status: AC
  Administered 2019-02-12: 12:00:00 via INTRAVENOUS
  Filled 2019-02-12: qty 250

## 2019-02-12 NOTE — Patient Instructions (Signed)
August Discharge Instructions for Patients Receiving Chemotherapy  Today you received the following chemotherapy agents: Rituxan   To help prevent nausea and vomiting after your treatment, we encourage you to take your nausea medication as directed.    If you develop nausea and vomiting that is not controlled by your nausea medication, call the clinic.   BELOW ARE SYMPTOMS THAT SHOULD BE REPORTED IMMEDIATELY:  *FEVER GREATER THAN 100.5 F  *CHILLS WITH OR WITHOUT FEVER  NAUSEA AND VOMITING THAT IS NOT CONTROLLED WITH YOUR NAUSEA MEDICATION  *UNUSUAL SHORTNESS OF BREATH  *UNUSUAL BRUISING OR BLEEDING  TENDERNESS IN MOUTH AND THROAT WITH OR WITHOUT PRESENCE OF ULCERS  *URINARY PROBLEMS  *BOWEL PROBLEMS  UNUSUAL RASH Items with * indicate a potential emergency and should be followed up as soon as possible.  Feel free to call the clinic should you have any questions or concerns. The clinic phone number is [phone].  Please show the [place] at check-in to the Emergency Department and triage nurse.

## 2019-02-16 ENCOUNTER — Telehealth: Payer: Self-pay | Admitting: Hematology

## 2019-02-16 NOTE — Telephone Encounter (Signed)
Scheduled appt per 5/22 los.  A calendar will be mailed out.

## 2019-03-31 ENCOUNTER — Encounter: Payer: Self-pay | Admitting: Hematology

## 2019-04-12 NOTE — Progress Notes (Signed)
HEMATOLOGY/ONCOLOGY CLINIC NOTE  Date of Service: 04/12/19    Patient Care Team: Brunetta Genera, MD as PCP - General (Hematology)   CHIEF COMPLAINTS/PURPOSE OF CONSULTATION:  F/u for Follicular lymphoma prior to next cycle of Maintenance Rituxan   HISTORY OF PRESENTING ILLNESS:  Katelyn Reeves is a wonderful 68 y.o. female who has been referred to [place] by Dr .Irene Limbo, [place], MD for evaluation and management of suspected non-Hodgkin's lymphoma.  Patient notes that she presented with back pain for 4-6 weeks and eventually had pain radiating to her right inguinal area. She notes that she has had interstitial cystitis in the past. She was thought to have a urinary tract infection and was treated with Macrodantin. Says the pain got worse and started radiating into the right groin she had a CT of the abdomen renal stone protocol on 10/25/2016 which showed extensive retroperitoneal lymphadenopathy concerning for lymphoproliferative disorder. She was incidentally noted to have a nodular border of the liver with possible liver cirrhosis. No urinary stones noted.  She subsequently had a PET CT scan ordered by her primary care physician which was done on 11/07/2016. This showed diffuse hypermetabolic spleen with mild splenomegaly. Hypermetabolic adenopathy in the left supraclavicular, bilateral axillary, peripancreatic, gastrohepatic ligament, celiac, porta hepatis, periaortic, and left external iliac chains. Hypermetabolic mesenteric lymph nodes. Appearance favors lymphoma.  Patient notes some night sweats for 2 weeks. Has lost about 10 pounds in the last month. No acute fevers or chills. Notes the back pain is controlled with when necessary Vicodin.   INTERVAL HISTORY  Katelyn Reeves is here for her scheduled follow-up regarding her follicular lymphoma and tenth maintenance dose of Rituxan. The patient's last visit with [place] was on 02/12/2019. The pt reports that she is doing well overall.  She continues with cycle 11 of rituximab. The pt has no prohibitive toxicities from continuing this treatment at this time.   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 Her back pain subsided for a while, but it has flared up again. She continues to self isolate; she orders her groceries online and has not gone shopping since 11/2018.   Lab results today (04/12/19) of CBC w/diff and CMP is as follows: all values are WNL except for CO2 at 21 and glucose at 167.  On review of systems, pt reports neuropathy and denies other symptoms.     MEDICAL HISTORY:  #1 fibrocystic disease of the breast #2 hypertension #3 constipation #4 duplicated right sided ureters #5 imaging concern for possible liver cirrhosis   SURGICAL HISTORY: #1 total abdominal hysterectomy with bilateral salpingo-oophorectomy for ovarian cyst. #2 Lipoma removal left upper back #3 hemorrhoidectomy   SOCIAL HISTORY: Social History   Socioeconomic History  . Marital status: Divorced    Spouse name: Not on file  . Number of children: Not on file  . Years of education: Not on file  . Highest education level: Not on file  Occupational History  . Not on file  Social Needs  . Financial resource strain: Not on file  . Food insecurity    Worry: Not on file    Inability: Not on file  . Transportation needs    Medical: Not on file    Non-medical: Not on file  Tobacco Use  . Smoking status: Never Smoker  . Smokeless tobacco: Never Used  Substance and Sexual Activity  . Alcohol use: No  . Drug use: No  . Sexual activity: Not  on file  Lifestyle  . Physical activity    Days per week: Not on file    Minutes per session: Not on file  . Stress: Not on file  Relationships  . Social Herbalist on phone: Not on file    Gets together: Not on file    Attends religious service: Not on file    Active member of club or organization: Not on file    Attends meetings of clubs or  organizations: Not on file    Relationship status: Not on file  . Intimate partner violence    Fear of current or ex partner: Not on file    Emotionally abused: Not on file    Physically abused: Not on file    Forced sexual activity: Not on file  Other Topics Concern  . Not on file  Social History Narrative  . Not on file  Nonsmoker no issues with alcohol use or drug use. 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   FAMILY HISTORY: Notes her mother had ovarian cancer followed by leukemia and died at age 38 years Maternal aunt gastric cancer   ALLERGIES:  is allergic to sulfa antibiotics.  MEDICATIONS:  Current Outpatient Medications  Medication Sig Dispense Refill  . HYDROcodone-acetaminophen (NORCO/VICODIN) 5-325 MG tablet as needed.  0  . lidocaine-prilocaine (EMLA) cream Apply to port-a-cath 1 hr prior to treatment/lab draw 30 g 0  . traMADol (ULTRAM) 50 MG tablet Take 1 tablet (50 mg total) by mouth every 6 (six) hours as needed. (Patient not taking: Reported on 05/28/2018) 30 tablet 0   No current facility-administered medications for this visit.    Facility-Administered Medications Ordered in Other Visits  Medication Dose Route Frequency Provider Last Rate Last Dose  . sodium chloride flush (NS) 0.9 % injection 10 mL  10 mL Intracatheter PRN Brunetta Genera, MD   10 mL at 07/04/17 1540    REVIEW OF SYSTEMS:    A 10+ POINT REVIEW OF SYSTEMS WAS OBTAINED including neurology, dermatology, psychiatry, cardiac, respiratory, lymph, extremities, GI, GU, Musculoskeletal, constitutional, breasts, reproductive, HEENT.  All pertinent positives are noted in the HPI.  All others are negative.   PHYSICAL EXAMINATION: ECOG PERFORMANCE STATUS: 1 - Symptomatic but completely ambulatory  There were no vitals filed for this visit. There were no vitals filed for this visit. .There is no height or weight on file to calculate  BMI.  GENERAL:alert, in no acute distress and comfortable SKIN: no acute rashes, no significant lesions EYES: conjunctiva are pink and non-injected, sclera anicteric OROPHARYNX: MMM, no exudates, no oropharyngeal erythema or ulceration NECK: supple, no JVD LYMPH:  no palpable lymphadenopathy in the cervical, axillary or inguinal regions LUNGS: clear to auscultation b/l with normal respiratory effort HEART: regular rate & rhythm ABDOMEN:  normoactive bowel sounds , non tender, not distended. No palpable hepatosplenomegaly.  Extremity: no pedal edema PSYCH: alert & oriented x 3 with fluent speech NEURO: no focal motor/sensory deficits   LABORATORY DATA:  I have reviewed the data as listed  . CBC Latest Ref Rng & Units 02/12/2019 12/14/2018 10/13/2018  WBC 4.0 - 10.5 K/uL 4.8 5.3 5.7  Hemoglobin 12.0 - 15.0 g/dL 13.5 12.7 12.3  Hematocrit 36.0 - 46.0 % 39.4 37.3 36.7  Platelets 150 - 400 K/uL 232 213 228   CBC    Component Value Date/Time   WBC 4.8 02/12/2019 0859   RBC 4.38 02/12/2019 0859  HGB 13.5 02/12/2019 0859   HGB 13.4 05/28/2018 1054   HGB 12.6 09/04/2017 1006   HCT 39.4 02/12/2019 0859   HCT 36.6 09/04/2017 1006   PLT 232 02/12/2019 0859   PLT 248 05/28/2018 1054   PLT 222 09/04/2017 1006   MCV 90.0 02/12/2019 0859   MCV 88.4 09/04/2017 1006   MCH 30.8 02/12/2019 0859   MCHC 34.3 02/12/2019 0859   RDW 11.9 02/12/2019 0859   RDW 13.1 09/04/2017 1006   LYMPHSABS 1.2 02/12/2019 0859   LYMPHSABS 1.1 09/04/2017 1006   MONOABS 0.4 02/12/2019 0859   MONOABS 0.3 09/04/2017 1006   EOSABS 0.2 02/12/2019 0859   EOSABS 0.2 09/04/2017 1006   BASOSABS 0.1 02/12/2019 0859   BASOSABS 0.0 09/04/2017 1006   . CMP Latest Ref Rng & Units 02/12/2019 12/14/2018 10/13/2018  Glucose 70 - 99 mg/dL 127(H) 140(H) 151(H)  BUN 8 - 23 mg/dL 9 10 13   Creatinine 0.44 - 1.00 mg/dL 0.78 1.00 0.77  Sodium 135 - 145 mmol/L 140 141 139  Potassium 3.5 - 5.1 mmol/L 4.3 3.8 4.3  Chloride 98 -  111 mmol/L 106 109 105  CO2 22 - 32 mmol/L 23 21(L) 23  Calcium 8.9 - 10.3 mg/dL 9.6 9.2 9.6  Total Protein 6.5 - 8.1 g/dL 7.6 7.5 7.3  Total Bilirubin 0.3 - 1.2 mg/dL 0.3 0.5 0.4  Alkaline Phos 38 - 126 U/L 91 67 76  AST 15 - 41 U/L 22 24 19   ALT 0 - 44 U/L 18 25 20    . Lab Results  Component Value Date   LDH 133 02/12/2019   Component     Latest Ref Rng & Units 11/12/2016  Hep C Virus Ab     0.0 - 0.9 s/co ratio <0.1  Hepatitis B Surface Ag     Negative Negative  Hep B Core Ab, Tot     Negative Negative       RADIOGRAPHIC STUDIES: I have personally reviewed the radiological images as listed and agreed with the findings in the report. No results found.   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  Presented with Generalized FDG avid lymphadenopathy - predominantly in the abdomen/Retroperitoneum. Also noted to have left supraclavicular and bilateral axillary left more than right FDG avid lymphadenopathy. Based on imaging this would represent at least Stage III disease if this were a lymphoma. LDH level is not significantly elevated. Blood counts are stable. PET/CT scan does show fairly active disease which is FDG avid LNadenopathy and FDG avid splenomegaly. Patient has some constitutional symptoms with about a 10 pound weight loss and some night sweats. Hepatitis profile negative. No other obvious new focal symptoms.  Patient has had an ECHO which shows nl EF  Patient is status post 6 cycles of R CHOP with no prohibitive toxicities.  PET/CT on 8/72018 - showed complete metabolic response with no metabolically active disease. A few borderline lymph nodes in the retroperitoneum.  CT chest/abd/pelvis 11/04/2017:  No new or progressive findings to suggest recurrent disease. The small abdominal retroperitoneal lymph  nodes are unchanged when comparing to PET-CT of 04/29/2017.   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 Improved with massage therapy  #4 h/o mild shingles   PLAN:  -Discussed pt labwork  today, 04/12/19; all values are WNL except for CO2 at 21 and glucose at 167. The pt has no prohibitive toxicities from continuing Rituximab at this time.  - no lab or clinical evidence of lymphoma progression at this time. -Rituxan orders reviewed and signed -Discussed repeat scans after the 12th cycle.  -Return 06/14/2019 for 12th cycle.    FOLLOW UP: RTC as per scheduled appointments on 9/21     All of the patients questions were answered with apparent satisfaction. The patient knows to call the clinic with any problems, questions or concerns.  The total time spent in the appt was 25 minutes and more than 50% was on counseling and direct patient cares.     Sullivan Lone MD Katelyn AAHIVMS [facility] Hematology/Oncology Physician [facility]  (Office):       [phone] (Work cell):  [phone] (Fax):           [phone]  I, Jacqualyn Posey, am acting as a scribe for Dr. Sullivan Lone.   .I have reviewed the above documentation for accuracy and completeness, and I agree with the above. Brunetta Genera MD

## 2019-04-13 ENCOUNTER — Inpatient Hospital Stay: Payer: Medicare Other

## 2019-04-13 ENCOUNTER — Other Ambulatory Visit: Payer: Self-pay

## 2019-04-13 ENCOUNTER — Inpatient Hospital Stay: Payer: Medicare Other | Attending: Hematology

## 2019-04-13 ENCOUNTER — Inpatient Hospital Stay (HOSPITAL_BASED_OUTPATIENT_CLINIC_OR_DEPARTMENT_OTHER): Payer: Medicare Other | Admitting: Hematology

## 2019-04-13 ENCOUNTER — Telehealth: Payer: Self-pay | Admitting: Hematology

## 2019-04-13 VITALS — BP 143/75 | HR 61 | Temp 98.4°F | Resp 18

## 2019-04-13 VITALS — BP 151/67 | HR 72 | Temp 99.1°F | Resp 18 | Ht 63.0 in | Wt 191.9 lb

## 2019-04-13 DIAGNOSIS — C8248 Follicular lymphoma grade IIIb, lymph nodes of multiple sites: Secondary | ICD-10-CM

## 2019-04-13 DIAGNOSIS — Z5112 Encounter for antineoplastic immunotherapy: Secondary | ICD-10-CM

## 2019-04-13 DIAGNOSIS — M25511 Pain in right shoulder: Secondary | ICD-10-CM | POA: Diagnosis not present

## 2019-04-13 DIAGNOSIS — Z79899 Other long term (current) drug therapy: Secondary | ICD-10-CM | POA: Diagnosis not present

## 2019-04-13 DIAGNOSIS — Z7189 Other specified counseling: Secondary | ICD-10-CM

## 2019-04-13 LAB — CMP (CANCER CENTER ONLY)
ALT: 14 U/L (ref 0–44)
AST: 16 U/L (ref 15–41)
Albumin: 4.1 g/dL (ref 3.5–5.0)
Alkaline Phosphatase: 91 U/L (ref 38–126)
Anion gap: 13 (ref 5–15)
BUN: 11 mg/dL (ref 8–23)
CO2: 21 mmol/L — ABNORMAL LOW (ref 22–32)
Calcium: 9.3 mg/dL (ref 8.9–10.3)
Chloride: 108 mmol/L (ref 98–111)
Creatinine: 0.79 mg/dL (ref 0.44–1.00)
GFR, Est AFR Am: >60 mL/min (ref >60)
GFR, Estimated: >60 mL/min (ref >60)
Glucose, Bld: 167 mg/dL — ABNORMAL HIGH (ref 70–99)
Potassium: 4 mmol/L (ref 3.5–5.1)
Sodium: 142 mmol/L (ref 135–145)
Total Bilirubin: 0.3 mg/dL (ref 0.3–1.2)
Total Protein: 7.4 g/dL (ref 6.5–8.1)

## 2019-04-13 LAB — CBC WITH DIFFERENTIAL/PLATELET
Abs Immature Granulocytes: 0.01 10*3/uL (ref 0.00–0.07)
Basophils Absolute: 0.1 10*3/uL (ref 0.0–0.1)
Basophils Relative: 1 %
Eosinophils Absolute: 0.2 10*3/uL (ref 0.0–0.5)
Eosinophils Relative: 4 %
HCT: 37.3 % (ref 36.0–46.0)
Hemoglobin: 12.7 g/dL (ref 12.0–15.0)
Immature Granulocytes: 0 %
Lymphocytes Relative: 22 %
Lymphs Abs: 1.2 10*3/uL (ref 0.7–4.0)
MCH: 30.5 pg (ref 26.0–34.0)
MCHC: 34 g/dL (ref 30.0–36.0)
MCV: 89.7 fL (ref 80.0–100.0)
Monocytes Absolute: 0.4 10*3/uL (ref 0.1–1.0)
Monocytes Relative: 8 %
Neutro Abs: 3.5 10*3/uL (ref 1.7–7.7)
Neutrophils Relative %: 65 %
Platelets: 223 10*3/uL (ref 150–400)
RBC: 4.16 MIL/uL (ref 3.87–5.11)
RDW: 12.1 % (ref 11.5–15.5)
WBC: 5.4 10*3/uL (ref 4.0–10.5)
nRBC: 0 % (ref 0.0–0.2)

## 2019-04-13 LAB — LACTATE DEHYDROGENASE: LDH: 131 U/L (ref 98–192)

## 2019-04-13 MED ORDER — DIPHENHYDRAMINE HCL 25 MG PO CAPS
25.0000 mg | ORAL_CAPSULE | Freq: Once | ORAL | Status: AC
  Administered 2019-04-13: 11:00:00 25 mg via ORAL

## 2019-04-13 MED ORDER — SODIUM CHLORIDE 0.9 % IV SOLN
Freq: Once | INTRAVENOUS | Status: AC
  Administered 2019-04-13: 11:00:00 via INTRAVENOUS
  Filled 2019-04-13: qty 250

## 2019-04-13 MED ORDER — DIPHENHYDRAMINE HCL 25 MG PO CAPS
ORAL_CAPSULE | ORAL | Status: AC
  Filled 2019-04-13: qty 1

## 2019-04-13 MED ORDER — SODIUM CHLORIDE 0.9 % IV SOLN
375.0000 mg/m2 | Freq: Once | INTRAVENOUS | Status: AC
  Administered 2019-04-13: 12:00:00 700 mg via INTRAVENOUS
  Filled 2019-04-13: qty 50

## 2019-04-13 MED ORDER — ACETAMINOPHEN 325 MG PO TABS
650.0000 mg | ORAL_TABLET | Freq: Once | ORAL | Status: AC
  Administered 2019-04-13: 11:00:00 650 mg via ORAL

## 2019-04-13 MED ORDER — ACETAMINOPHEN 325 MG PO TABS
ORAL_TABLET | ORAL | Status: AC
  Filled 2019-04-13: qty 2

## 2019-04-13 NOTE — Telephone Encounter (Signed)
Per 7/20 los RTC as per scheduled appointments on 9/21

## 2019-04-13 NOTE — Patient Instructions (Signed)
[facility] Discharge Instructions for Patients Receiving Chemotherapy  Today you received the following chemotherapy agents Rituxan  To help prevent nausea and vomiting after your treatment, we encourage you to take your nausea medication as directed by your MD.   If you develop nausea and vomiting that is not controlled by your nausea medication, call the clinic.   BELOW ARE SYMPTOMS THAT SHOULD BE REPORTED IMMEDIATELY:  *FEVER GREATER THAN 100.5 F  *CHILLS WITH OR WITHOUT FEVER  NAUSEA AND VOMITING THAT IS NOT CONTROLLED WITH YOUR NAUSEA MEDICATION  *UNUSUAL SHORTNESS OF BREATH  *UNUSUAL BRUISING OR BLEEDING  TENDERNESS IN MOUTH AND THROAT WITH OR WITHOUT PRESENCE OF ULCERS  *URINARY PROBLEMS  *BOWEL PROBLEMS  UNUSUAL RASH Items with * indicate a potential emergency and should be followed up as soon as possible.  Feel free to call the clinic should you have any questions or concerns. The clinic phone number is [phone].  Please show the [place] at check-in to the Emergency Department and triage nurse.  Coronavirus (COVID-19) Are you at risk?  Are you at risk for the Coronavirus (COVID-19)?  To be considered HIGH RISK for Coronavirus (COVID-19), you have to meet the following criteria:  . Traveled to [place], [place], [place], [place] or [place]; or in the [place] to [place], [place], [place], or [place]; and have fever, cough, and shortness of breath within the last 2 weeks of travel OR . Been in close contact with a person diagnosed with COVID-19 within the last 2 weeks and have fever, cough, and shortness of breath . IF YOU DO NOT MEET THESE CRITERIA, YOU ARE CONSIDERED LOW RISK FOR COVID-19.  What to do if you are HIGH RISK for COVID-19?  Marland Kitchen If you are having a medical emergency, call 911. . Seek medical care right away. Before you go to a doctor's office, urgent care or emergency department, call ahead and tell them about  your recent travel, contact with someone diagnosed with COVID-19, and your symptoms. You should receive instructions from your physician's office regarding next steps of care.  . When you arrive at healthcare provider, tell the healthcare staff immediately you have returned from visiting [place], [place], [place], [place] or [place]; or traveled in the [place] to [place], [place], [place], or [place]; in the last two weeks or you have been in close contact with a person diagnosed with COVID-19 in the last 2 weeks.   . Tell the health care staff about your symptoms: fever, cough and shortness of breath. . After you have been seen by a medical provider, you will be either: o Tested for (COVID-19) and discharged home on quarantine except to seek medical care if symptoms worsen, and asked to  - Stay home and avoid contact with others until you get your results (4-5 days)  - Avoid travel on public transportation if possible (such as bus, train, or airplane) or o Sent to the Emergency Department by EMS for evaluation, COVID-19 testing, and possible admission depending on your condition and test results.  What to do if you are LOW RISK for COVID-19?  Reduce your risk of any infection by using the same precautions used for avoiding the common cold or flu:  Marland Kitchen Wash your hands often with soap and warm water for at least 20 seconds.  If soap and water are not readily available, use an alcohol-based hand sanitizer with at least 60% alcohol.  . If  coughing or sneezing, cover your mouth and nose by coughing or sneezing into the elbow areas of your shirt or coat, into a tissue or into your sleeve (not your hands). . Avoid shaking hands with others and consider head nods or verbal greetings only. . Avoid touching your eyes, nose, or mouth with unwashed hands.  . Avoid close contact with people who are sick. . Avoid places or events with large numbers of people in one location, like concerts or sporting  events. . Carefully consider travel plans you have or are making. . If you are planning any travel outside or inside the [place], visit the CDC's Travelers' Health webpage for the latest health notices. . If you have some symptoms but not all symptoms, continue to monitor at home and seek medical attention if your symptoms worsen. . If you are having a medical emergency, call 911.   [place] / e-Visit: [email or web address]         [facility]: [facility]: [phone]                   [facility]: [phone]

## 2019-06-13 NOTE — Progress Notes (Signed)
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 female who has been referred to [place] by Dr .Irene Limbo, [place], MD for evaluation and management of suspected non-Hodgkin's lymphoma.  Patient notes that she presented with back pain for 4-6 weeks and eventually had pain radiating to her right inguinal area. She notes that she has had interstitial cystitis in the past. She was thought to have a urinary tract infection and was treated with Macrodantin. Says the pain got worse and started radiating into the right groin she had a CT of the abdomen renal stone protocol on 10/25/2016 which showed extensive retroperitoneal lymphadenopathy concerning for lymphoproliferative disorder. She was incidentally noted to have a nodular border of the liver with possible liver cirrhosis. No urinary stones noted.  She subsequently had a PET CT scan ordered by her primary care physician which was done on 11/07/2016. This showed diffuse hypermetabolic spleen with mild splenomegaly. Hypermetabolic adenopathy in the left supraclavicular, bilateral axillary, peripancreatic, gastrohepatic ligament, celiac, porta hepatis, periaortic, and left external iliac chains. Hypermetabolic mesenteric lymph nodes. Appearance favors lymphoma.  Patient notes some night sweats for 2 weeks. Has lost about 10 pounds in the last month. No acute fevers or chills. Notes the back pain is controlled with when necessary Vicodin.   INTERVAL HISTORY  Katelyn Reeves is here for her scheduled follow-up regarding her follicular lymphoma and 12th maintenance dose of Rituxan. The patient's last visit with [place] was on 04/13/2019. The pt reports that she is doing well overall.   The pt reports that she has a good appetite, good mental health, has gained weight and has had no back pain. Pt has been doing a very thorough job of socially distancing. She has been keeping busy with her new hobby, Genealogy. Pt is interested in swimming in her pool next summer and is interested in traveling after the pandemic is over.    Lab results today (06/14/19) of CBC w/diff and CMP is as follows: all values are WNL except for Glucose at 131. 06/14/2019 LDH at 137  On review of systems, pt reports weight gain, good appetite, good mental health and denies back pain, abdominal pain and any other symptoms.    MEDICAL HISTORY:  #1 fibrocystic disease of the breast #2 hypertension #3 constipation #4 duplicated right sided ureters #5 imaging concern for possible liver cirrhosis   SURGICAL HISTORY: #1 total abdominal hysterectomy with bilateral salpingo-oophorectomy for ovarian cyst. #2 Lipoma removal left upper back #3 hemorrhoidectomy   SOCIAL HISTORY: Social History   Socioeconomic History  . Marital status: Divorced    Spouse name: Not on file  . Number of children: Not on file  . Years of education: Not on file  . Highest education level: Not on file  Occupational History  . Not on file  Social Needs  . Financial resource strain: Not on file  . Food insecurity    Worry: Not on file    Inability: Not on file  . Transportation needs    Medical: Not on file    Non-medical: Not on file  Tobacco Use  . Smoking status: Never Smoker  . Smokeless tobacco: Never Used  Substance and Sexual Activity  . Alcohol use:  No  . Drug use: No  . Sexual activity: Not on file  Lifestyle  . Physical activity    Days per week: Not on file    Minutes per session: Not on file  . Stress: Not on file  Relationships  . Social Herbalist on phone: Not on file    Gets together: Not on file    Attends religious service: Not on file    Active member of club or organization:  Not on file    Attends meetings of clubs or organizations: Not on file    Relationship status: Not on file  . Intimate partner violence    Fear of current or ex partner: Not on file    Emotionally abused: Not on file    Physically abused: Not on file    Forced sexual activity: Not on file  Other Topics Concern  . Not on file  Social History Narrative  . Not on file  Nonsmoker no issues with alcohol use or drug use. 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   FAMILY HISTORY: Notes her mother had ovarian cancer followed by leukemia and died at age 7 years Maternal aunt gastric cancer   ALLERGIES:  is allergic to sulfa antibiotics.  MEDICATIONS:  Current Outpatient Medications  Medication Sig Dispense Refill  . HYDROcodone-acetaminophen (NORCO/VICODIN) 5-325 MG tablet as needed.  0  . lidocaine-prilocaine (EMLA) cream Apply to port-a-cath 1 hr prior to treatment/lab draw 30 g 0  . traMADol (ULTRAM) 50 MG tablet Take 1 tablet (50 mg total) by mouth every 6 (six) hours as needed. (Patient not taking: Reported on 05/28/2018) 30 tablet 0   No current facility-administered medications for this visit.    Facility-Administered Medications Ordered in Other Visits  Medication Dose Route Frequency Provider Last Rate Last Dose  . sodium chloride flush (NS) 0.9 % injection 10 mL  10 mL Intracatheter PRN Brunetta Genera, MD   10 mL at 07/04/17 1540    REVIEW OF SYSTEMS:    A 10+ POINT REVIEW OF SYSTEMS WAS OBTAINED including neurology, dermatology, psychiatry, cardiac, respiratory, lymph, extremities, GI, GU, Musculoskeletal, constitutional, breasts, reproductive, HEENT.  All pertinent positives are noted in the HPI.  All others are negative.   PHYSICAL EXAMINATION: ECOG PERFORMANCE STATUS: 1 - Symptomatic but completely ambulatory  Vitals:   06/14/19 1032  BP: (!) 162/75  Pulse: 80  Resp: 18  Temp: 98.2 F (36.8 C)   SpO2: 99%   Filed Weights   06/14/19 1032  Weight: 197 lb 6.4 oz (89.5 kg)   .Body mass index is 34.97 kg/m.   GENERAL:alert, in no acute distress and comfortable SKIN: no acute rashes, no significant lesions EYES: conjunctiva are pink and non-injected, sclera anicteric OROPHARYNX: MMM, no exudates, no oropharyngeal erythema or ulceration NECK: supple, no JVD LYMPH:  no palpable lymphadenopathy in the cervical, axillary or inguinal regions LUNGS: clear to auscultation b/l with normal respiratory effort HEART: regular rate & rhythm ABDOMEN:  normoactive bowel sounds , non tender, not distended. No palpable hepatosplenomegaly.  Extremity: no pedal edema PSYCH: alert & oriented x 3 with fluent speech NEURO: no focal motor/sensory deficits   LABORATORY DATA:  I have reviewed the data as listed  . CBC Latest Ref Rng & Units 06/14/2019 04/13/2019 02/12/2019  WBC 4.0 - 10.5 K/uL 5.5 5.4 4.8  Hemoglobin 12.0 - 15.0 g/dL 12.4 12.7 13.5  Hematocrit 36.0 - 46.0 % 36.2  37.3 39.4  Platelets 150 - 400 K/uL 219 223 232   CBC    Component Value Date/Time   WBC 5.5 06/14/2019 0959   RBC 4.01 06/14/2019 0959   HGB 12.4 06/14/2019 0959   HGB 13.4 05/28/2018 1054   HGB 12.6 09/04/2017 1006   HCT 36.2 06/14/2019 0959   HCT 36.6 09/04/2017 1006   PLT 219 06/14/2019 0959   PLT 248 05/28/2018 1054   PLT 222 09/04/2017 1006   MCV 90.3 06/14/2019 0959   MCV 88.4 09/04/2017 1006   MCH 30.9 06/14/2019 0959   MCHC 34.3 06/14/2019 0959   RDW 12.4 06/14/2019 0959   RDW 13.1 09/04/2017 1006   LYMPHSABS 1.2 06/14/2019 0959   LYMPHSABS 1.1 09/04/2017 1006   MONOABS 0.5 06/14/2019 0959   MONOABS 0.3 09/04/2017 1006   EOSABS 0.2 06/14/2019 0959   EOSABS 0.2 09/04/2017 1006   BASOSABS 0.0 06/14/2019 0959   BASOSABS 0.0 09/04/2017 1006   . CMP Latest Ref Rng & Units 06/14/2019 04/13/2019 02/12/2019  Glucose 70 - 99 mg/dL 131(H) 167(H) 127(H)  BUN 8 - 23 mg/dL 8 11 9   Creatinine 0.44 - 1.00  mg/dL 0.77 0.79 0.78  Sodium 135 - 145 mmol/L 141 142 140  Potassium 3.5 - 5.1 mmol/L 3.8 4.0 4.3  Chloride 98 - 111 mmol/L 106 108 106  CO2 22 - 32 mmol/L 25 21(L) 23  Calcium 8.9 - 10.3 mg/dL 9.4 9.3 9.6  Total Protein 6.5 - 8.1 g/dL 7.2 7.4 7.6  Total Bilirubin 0.3 - 1.2 mg/dL 0.3 0.3 0.3  Alkaline Phos 38 - 126 U/L 83 91 91  AST 15 - 41 U/L 20 16 22   ALT 0 - 44 U/L 19 14 18    . Lab Results  Component Value Date   LDH 131 04/13/2019   Component     Latest Ref Rng & Units 11/12/2016  Hep C Virus Ab     0.0 - 0.9 s/co ratio <0.1  Hepatitis B Surface Ag     Negative Negative  Hep B Core Ab, Tot     Negative Negative       RADIOGRAPHIC STUDIES: I have personally reviewed the radiological images as listed and agreed with the findings in the report. No results found.   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  Presented with Generalized FDG avid lymphadenopathy - predominantly in the abdomen/Retroperitoneum. Also noted to have left supraclavicular and bilateral axillary left more than right FDG avid lymphadenopathy. Based on imaging this would represent at least Stage III disease if this were a lymphoma. LDH level is not significantly elevated. Blood counts are stable. PET/CT scan does show fairly active disease which is FDG avid LNadenopathy and FDG avid splenomegaly. Patient has some constitutional symptoms with about a 10 pound weight loss and some night sweats. Hepatitis profile negative. No other obvious new focal symptoms.  Patient has had an ECHO which shows nl EF  Patient is status post 6 cycles of R CHOP with no prohibitive toxicities.  PET/CT on 8/72018 - showed complete metabolic response with no metabolically active disease. A few borderline lymph nodes in the retroperitoneum.  CT  chest/abd/pelvis 11/04/2017:  No new or progressive findings to suggest recurrent disease. The small abdominal retroperitoneal lymph nodes are unchanged when comparing to PET-CT of 04/29/2017.   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 Improved with massage therapy  #4 h/o mild shingles   PLAN:  -Discussed pt labwork today, 06/14/19; all values are WNL except for Glucose at 131. -Discussed 06/14/2019 LDH at 137 -Advised pt that it is okay for her to swim in her private pool  -The pt has no prohibitive toxicities from continuing C12D1 Rituximab at this time -No lab or clinical evidence of lymphoma progression at this time. -Pt will get flu shot today -Would recommend monitoring with labs going forward after considering risk vs. benefit of conitnuing maintenance treatment -Will get a repeat CT C/A/P in 11 weeks -Will see back in 4 months with labs    FOLLOW UP: Ct chest/abd/pelvis in 11 weeks with labs RTC with Dr Irene Limbo in 12 weeks   The total time spent in the appt was 25 minutes and more than 50% was on counseling and direct patient cares.  All of the patient's questions were answered with apparent satisfaction. The patient knows to call the clinic with any problems, questions or concerns.    Sullivan Lone MD [place] AAHIVMS [facility] Hematology/Oncology Physician [facility]  (Office):       [phone] (Work cell):  [phone] (Fax):           [phone]  I, Yevette Edwards, am acting as a scribe for Dr. Sullivan Lone.   .I have reviewed the above documentation for accuracy and completeness, and I agree with the above. Brunetta Genera MD

## 2019-06-14 ENCOUNTER — Inpatient Hospital Stay (HOSPITAL_BASED_OUTPATIENT_CLINIC_OR_DEPARTMENT_OTHER): Payer: Medicare Other | Admitting: Hematology

## 2019-06-14 ENCOUNTER — Inpatient Hospital Stay: Payer: Medicare Other | Attending: Hematology

## 2019-06-14 ENCOUNTER — Other Ambulatory Visit: Payer: Self-pay

## 2019-06-14 ENCOUNTER — Inpatient Hospital Stay: Payer: Medicare Other

## 2019-06-14 VITALS — BP 162/75 | HR 80 | Temp 98.2°F | Resp 18 | Ht 63.0 in | Wt 197.4 lb

## 2019-06-14 VITALS — BP 127/68 | HR 70 | Temp 98.1°F | Resp 18

## 2019-06-14 DIAGNOSIS — Z23 Encounter for immunization: Secondary | ICD-10-CM | POA: Diagnosis not present

## 2019-06-14 DIAGNOSIS — Z5112 Encounter for antineoplastic immunotherapy: Secondary | ICD-10-CM | POA: Diagnosis not present

## 2019-06-14 DIAGNOSIS — C8248 Follicular lymphoma grade IIIb, lymph nodes of multiple sites: Secondary | ICD-10-CM | POA: Diagnosis not present

## 2019-06-14 DIAGNOSIS — Z7189 Other specified counseling: Secondary | ICD-10-CM

## 2019-06-14 LAB — CBC WITH DIFFERENTIAL/PLATELET
Abs Immature Granulocytes: 0.01 10*3/uL (ref 0.00–0.07)
Basophils Absolute: 0 10*3/uL (ref 0.0–0.1)
Basophils Relative: 1 %
Eosinophils Absolute: 0.2 10*3/uL (ref 0.0–0.5)
Eosinophils Relative: 4 %
HCT: 36.2 % (ref 36.0–46.0)
Hemoglobin: 12.4 g/dL (ref 12.0–15.0)
Immature Granulocytes: 0 %
Lymphocytes Relative: 22 %
Lymphs Abs: 1.2 10*3/uL (ref 0.7–4.0)
MCH: 30.9 pg (ref 26.0–34.0)
MCHC: 34.3 g/dL (ref 30.0–36.0)
MCV: 90.3 fL (ref 80.0–100.0)
Monocytes Absolute: 0.5 10*3/uL (ref 0.1–1.0)
Monocytes Relative: 8 %
Neutro Abs: 3.5 10*3/uL (ref 1.7–7.7)
Neutrophils Relative %: 65 %
Platelets: 219 10*3/uL (ref 150–400)
RBC: 4.01 MIL/uL (ref 3.87–5.11)
RDW: 12.4 % (ref 11.5–15.5)
WBC: 5.5 10*3/uL (ref 4.0–10.5)
nRBC: 0 % (ref 0.0–0.2)

## 2019-06-14 LAB — CMP (CANCER CENTER ONLY)
ALT: 19 U/L (ref 0–44)
AST: 20 U/L (ref 15–41)
Albumin: 4.3 g/dL (ref 3.5–5.0)
Alkaline Phosphatase: 83 U/L (ref 38–126)
Anion gap: 10 (ref 5–15)
BUN: 8 mg/dL (ref 8–23)
CO2: 25 mmol/L (ref 22–32)
Calcium: 9.4 mg/dL (ref 8.9–10.3)
Chloride: 106 mmol/L (ref 98–111)
Creatinine: 0.77 mg/dL (ref 0.44–1.00)
GFR, Est AFR Am: >60 mL/min (ref >60)
GFR, Estimated: >60 mL/min (ref >60)
Glucose, Bld: 131 mg/dL — ABNORMAL HIGH (ref 70–99)
Potassium: 3.8 mmol/L (ref 3.5–5.1)
Sodium: 141 mmol/L (ref 135–145)
Total Bilirubin: 0.3 mg/dL (ref 0.3–1.2)
Total Protein: 7.2 g/dL (ref 6.5–8.1)

## 2019-06-14 LAB — LACTATE DEHYDROGENASE: LDH: 137 U/L (ref 98–192)

## 2019-06-14 MED ORDER — INFLUENZA VAC A&B SA ADJ QUAD 0.5 ML IM PRSY
PREFILLED_SYRINGE | INTRAMUSCULAR | Status: AC
  Filled 2019-06-14: qty 0.5

## 2019-06-14 MED ORDER — ACETAMINOPHEN 325 MG PO TABS
ORAL_TABLET | ORAL | Status: AC
  Filled 2019-06-14: qty 2

## 2019-06-14 MED ORDER — INFLUENZA VAC A&B SA ADJ QUAD 0.5 ML IM PRSY
0.5000 mL | PREFILLED_SYRINGE | Freq: Once | INTRAMUSCULAR | Status: AC
  Administered 2019-06-14: 13:00:00 0.5 mL via INTRAMUSCULAR

## 2019-06-14 MED ORDER — DIPHENHYDRAMINE HCL 25 MG PO CAPS
ORAL_CAPSULE | ORAL | Status: AC
  Filled 2019-06-14: qty 1

## 2019-06-14 MED ORDER — SODIUM CHLORIDE 0.9 % IV SOLN
375.0000 mg/m2 | Freq: Once | INTRAVENOUS | Status: AC
  Administered 2019-06-14: 700 mg via INTRAVENOUS
  Filled 2019-06-14: qty 50

## 2019-06-14 MED ORDER — ACETAMINOPHEN 325 MG PO TABS
650.0000 mg | ORAL_TABLET | Freq: Once | ORAL | Status: AC
  Administered 2019-06-14: 650 mg via ORAL

## 2019-06-14 MED ORDER — SODIUM CHLORIDE 0.9 % IV SOLN
Freq: Once | INTRAVENOUS | Status: AC
  Administered 2019-06-14: 14:00:00 via INTRAVENOUS
  Filled 2019-06-14: qty 250

## 2019-06-14 MED ORDER — DIPHENHYDRAMINE HCL 25 MG PO CAPS
25.0000 mg | ORAL_CAPSULE | Freq: Once | ORAL | Status: AC
  Administered 2019-06-14: 25 mg via ORAL

## 2019-06-14 NOTE — Patient Instructions (Signed)
[facility] Discharge Instructions for Patients Receiving Chemotherapy  Today you received the following chemotherapy agents Rituxan  To help prevent nausea and vomiting after your treatment, we encourage you to take your nausea medication as directed by your MD.   If you develop nausea and vomiting that is not controlled by your nausea medication, call the clinic.   BELOW ARE SYMPTOMS THAT SHOULD BE REPORTED IMMEDIATELY:  *FEVER GREATER THAN 100.5 F  *CHILLS WITH OR WITHOUT FEVER  NAUSEA AND VOMITING THAT IS NOT CONTROLLED WITH YOUR NAUSEA MEDICATION  *UNUSUAL SHORTNESS OF BREATH  *UNUSUAL BRUISING OR BLEEDING  TENDERNESS IN MOUTH AND THROAT WITH OR WITHOUT PRESENCE OF ULCERS  *URINARY PROBLEMS  *BOWEL PROBLEMS  UNUSUAL RASH Items with * indicate a potential emergency and should be followed up as soon as possible.  Feel free to call the clinic should you have any questions or concerns. The clinic phone number is [phone].  Please show the [place] at check-in to the Emergency Department and triage nurse.  Coronavirus (COVID-19) Are you at risk?  Are you at risk for the Coronavirus (COVID-19)?  To be considered HIGH RISK for Coronavirus (COVID-19), you have to meet the following criteria:  . Traveled to [place], [place], [place], [place] or [place]; or in the [place] to [place], [place], [place], or [place]; and have fever, cough, and shortness of breath within the last 2 weeks of travel OR . Been in close contact with a person diagnosed with COVID-19 within the last 2 weeks and have fever, cough, and shortness of breath . IF YOU DO NOT MEET THESE CRITERIA, YOU ARE CONSIDERED LOW RISK FOR COVID-19.  What to do if you are HIGH RISK for COVID-19?  Marland Kitchen If you are having a medical emergency, call 911. . Seek medical care right away. Before you go to a doctor's office, urgent care or emergency department, call ahead and tell them about  your recent travel, contact with someone diagnosed with COVID-19, and your symptoms. You should receive instructions from your physician's office regarding next steps of care.  . When you arrive at healthcare provider, tell the healthcare staff immediately you have returned from visiting [place], [place], [place], [place] or [place]; or traveled in the [place] to [place], [place], [place], or [place]; in the last two weeks or you have been in close contact with a person diagnosed with COVID-19 in the last 2 weeks.   . Tell the health care staff about your symptoms: fever, cough and shortness of breath. . After you have been seen by a medical provider, you will be either: o Tested for (COVID-19) and discharged home on quarantine except to seek medical care if symptoms worsen, and asked to  - Stay home and avoid contact with others until you get your results (4-5 days)  - Avoid travel on public transportation if possible (such as bus, train, or airplane) or o Sent to the Emergency Department by EMS for evaluation, COVID-19 testing, and possible admission depending on your condition and test results.  What to do if you are LOW RISK for COVID-19?  Reduce your risk of any infection by using the same precautions used for avoiding the common cold or flu:  Marland Kitchen Wash your hands often with soap and warm water for at least 20 seconds.  If soap and water are not readily available, use an alcohol-based hand sanitizer with at least 60% alcohol.  . If  coughing or sneezing, cover your mouth and nose by coughing or sneezing into the elbow areas of your shirt or coat, into a tissue or into your sleeve (not your hands). . Avoid shaking hands with others and consider head nods or verbal greetings only. . Avoid touching your eyes, nose, or mouth with unwashed hands.  . Avoid close contact with people who are sick. . Avoid places or events with large numbers of people in one location, like concerts or sporting  events. . Carefully consider travel plans you have or are making. . If you are planning any travel outside or inside the [place], visit the CDC's Travelers' Health webpage for the latest health notices. . If you have some symptoms but not all symptoms, continue to monitor at home and seek medical attention if your symptoms worsen. . If you are having a medical emergency, call 911.   [place] / e-Visit: [email or web address]         [facility]: [facility]: [phone]                   [facility]: [phone]

## 2019-06-15 ENCOUNTER — Telehealth: Payer: Self-pay | Admitting: Hematology

## 2019-06-15 NOTE — Telephone Encounter (Signed)
Scheduled appt per 9/21 los.  Printed and mailed appt calendar with central radiology number

## 2019-07-14 ENCOUNTER — Encounter: Payer: Self-pay | Admitting: Podiatry

## 2019-07-14 ENCOUNTER — Ambulatory Visit (INDEPENDENT_AMBULATORY_CARE_PROVIDER_SITE_OTHER): Payer: Medicare Other | Admitting: Podiatry

## 2019-07-14 ENCOUNTER — Other Ambulatory Visit: Payer: Self-pay

## 2019-07-14 DIAGNOSIS — M79676 Pain in unspecified toe(s): Secondary | ICD-10-CM | POA: Diagnosis not present

## 2019-07-14 DIAGNOSIS — B351 Tinea unguium: Secondary | ICD-10-CM

## 2019-07-18 NOTE — Progress Notes (Signed)
   SUBJECTIVE Patient presents to office today complaining of elongated, thickened nails that cause pain while ambulating in shoes. She is unable to trim her own nails.  She states she did some yard work about ten days ago and noticed some redness and swelling of the left great toe afterwards. She states she took the remainder of an Augmentin prescription that seemed to help alleviate her symptoms. She denies any worsening factors at this time. Patient is here for further evaluation and treatment.  Past Medical History:  Diagnosis Date  . Back pain   . Headache    migraines  . History of chemotherapy   . Shingles   . Swollen lymph nodes    left axilla, rectoperitonal     OBJECTIVE General Patient is awake, alert, and oriented x 3 and in no acute distress. Derm Skin is dry and supple bilateral. Negative open lesions or macerations. Remaining integument unremarkable. Nails are tender, long, thickened and dystrophic with subungual debris, consistent with onychomycosis, 1-5 bilateral. No signs of infection noted. Vasc  DP and PT pedal pulses palpable bilaterally. Temperature gradient within normal limits.  Neuro Epicritic and protective threshold sensation grossly intact bilaterally.  Musculoskeletal Exam No symptomatic pedal deformities noted bilateral. Muscular strength within normal limits.  ASSESSMENT 1. Onychodystrophic nails 1-5 bilateral with hyperkeratosis of nails.  2. Onychomycosis of nail due to dermatophyte bilateral 3. Pain in foot bilateral  PLAN OF CARE 1. Patient evaluated today.  2. Instructed to maintain good pedal hygiene and foot care.  3. Mechanical debridement of nails 1-5 bilaterally performed using a nail nipper. Filed with dremel without incident.  4. Return to clinic as needed. If patient returns we will perform permanent nail avulsions of the medial borders of the bilateral great toes.   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    Edrick Kins, DPM [facility]  Dr. Edrick Kins, [place]                                        [place], [place] [address]                Office [phone]  Fax [phone]

## 2019-08-30 ENCOUNTER — Other Ambulatory Visit: Payer: Self-pay

## 2019-08-30 ENCOUNTER — Inpatient Hospital Stay: Payer: Medicare Other | Attending: Hematology

## 2019-08-30 ENCOUNTER — Ambulatory Visit (HOSPITAL_COMMUNITY)
Admission: RE | Admit: 2019-08-30 | Discharge: 2019-08-30 | Disposition: A | Discharge status: Home / Self Care | Payer: Medicare Other | Source: Ambulatory Visit | Attending: Hematology | Admitting: Hematology

## 2019-08-30 ENCOUNTER — Encounter (HOSPITAL_COMMUNITY): Payer: Self-pay

## 2019-08-30 DIAGNOSIS — C8248 Follicular lymphoma grade IIIb, lymph nodes of multiple sites: Secondary | ICD-10-CM

## 2019-08-30 DIAGNOSIS — Z5112 Encounter for antineoplastic immunotherapy: Secondary | ICD-10-CM | POA: Diagnosis not present

## 2019-08-30 DIAGNOSIS — Z5111 Encounter for antineoplastic chemotherapy: Secondary | ICD-10-CM | POA: Diagnosis not present

## 2019-08-30 DIAGNOSIS — I10 Essential (primary) hypertension: Secondary | ICD-10-CM | POA: Diagnosis not present

## 2019-08-30 DIAGNOSIS — G629 Polyneuropathy, unspecified: Secondary | ICD-10-CM | POA: Diagnosis not present

## 2019-08-30 DIAGNOSIS — C8200 Follicular lymphoma grade I, unspecified site: Secondary | ICD-10-CM | POA: Diagnosis not present

## 2019-08-30 LAB — CMP (CANCER CENTER ONLY)
ALT: 21 U/L (ref 0–44)
AST: 23 U/L (ref 15–41)
Albumin: 4.2 g/dL (ref 3.5–5.0)
Alkaline Phosphatase: 73 U/L (ref 38–126)
Anion gap: 9 (ref 5–15)
BUN: 11 mg/dL (ref 8–23)
CO2: 26 mmol/L (ref 22–32)
Calcium: 9.1 mg/dL (ref 8.9–10.3)
Chloride: 105 mmol/L (ref 98–111)
Creatinine: 0.82 mg/dL (ref 0.44–1.00)
GFR, Est AFR Am: >60 mL/min (ref >60)
GFR, Estimated: >60 mL/min (ref >60)
Glucose, Bld: 162 mg/dL — ABNORMAL HIGH (ref 70–99)
Potassium: 4.1 mmol/L (ref 3.5–5.1)
Sodium: 140 mmol/L (ref 135–145)
Total Bilirubin: 0.4 mg/dL (ref 0.3–1.2)
Total Protein: 7.2 g/dL (ref 6.5–8.1)

## 2019-08-30 LAB — CBC WITH DIFFERENTIAL/PLATELET
Abs Immature Granulocytes: 0.01 10*3/uL (ref 0.00–0.07)
Basophils Absolute: 0 10*3/uL (ref 0.0–0.1)
Basophils Relative: 1 %
Eosinophils Absolute: 0.2 10*3/uL (ref 0.0–0.5)
Eosinophils Relative: 4 %
HCT: 36.9 % (ref 36.0–46.0)
Hemoglobin: 12.6 g/dL (ref 12.0–15.0)
Immature Granulocytes: 0 %
Lymphocytes Relative: 23 %
Lymphs Abs: 1.3 10*3/uL (ref 0.7–4.0)
MCH: 31 pg (ref 26.0–34.0)
MCHC: 34.1 g/dL (ref 30.0–36.0)
MCV: 90.9 fL (ref 80.0–100.0)
Monocytes Absolute: 0.5 10*3/uL (ref 0.1–1.0)
Monocytes Relative: 9 %
Neutro Abs: 3.6 10*3/uL (ref 1.7–7.7)
Neutrophils Relative %: 63 %
Platelets: 231 10*3/uL (ref 150–400)
RBC: 4.06 MIL/uL (ref 3.87–5.11)
RDW: 12.2 % (ref 11.5–15.5)
WBC: 5.6 10*3/uL (ref 4.0–10.5)
nRBC: 0 % (ref 0.0–0.2)

## 2019-08-30 LAB — LACTATE DEHYDROGENASE: LDH: 148 U/L (ref 98–192)

## 2019-08-30 MED ORDER — IOHEXOL 300 MG/ML  SOLN
75.0000 mL | Freq: Once | INTRAMUSCULAR | Status: AC | PRN
  Administered 2019-08-30: 100 mL via INTRAVENOUS

## 2019-08-30 MED ORDER — SODIUM CHLORIDE (PF) 0.9 % IJ SOLN
INTRAMUSCULAR | Status: AC
  Filled 2019-08-30: qty 50

## 2019-09-06 ENCOUNTER — Other Ambulatory Visit: Payer: Self-pay

## 2019-09-06 ENCOUNTER — Inpatient Hospital Stay (HOSPITAL_BASED_OUTPATIENT_CLINIC_OR_DEPARTMENT_OTHER): Payer: Medicare Other | Admitting: Hematology

## 2019-09-06 VITALS — BP 173/97 | HR 93 | Temp 97.8°F | Resp 18 | Ht 63.0 in | Wt 199.7 lb

## 2019-09-06 DIAGNOSIS — G629 Polyneuropathy, unspecified: Secondary | ICD-10-CM | POA: Diagnosis not present

## 2019-09-06 DIAGNOSIS — C8248 Follicular lymphoma grade IIIb, lymph nodes of multiple sites: Secondary | ICD-10-CM | POA: Diagnosis not present

## 2019-09-06 DIAGNOSIS — I10 Essential (primary) hypertension: Secondary | ICD-10-CM | POA: Diagnosis not present

## 2019-09-06 NOTE — Progress Notes (Signed)
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 female who has been referred to [place] by Dr .Irene Limbo, Cloria Spring, MD for evaluation and management of suspected non-Hodgkin's lymphoma.  Patient notes that she presented with back pain for 4-6 weeks and eventually had pain radiating to her right inguinal area. She notes that she has had interstitial cystitis in the past. She was thought to have a urinary tract infection and was treated with Macrodantin. Says the pain got worse and started radiating into the right groin she had a CT of the abdomen renal stone protocol on 10/25/2016 which showed extensive retroperitoneal lymphadenopathy concerning for lymphoproliferative disorder. She was incidentally noted to have a nodular border of the liver with possible liver cirrhosis. No urinary stones noted.  She subsequently had a PET CT scan ordered by her primary care physician which was done on 11/07/2016. This showed diffuse hypermetabolic spleen with mild splenomegaly. Hypermetabolic adenopathy in the left supraclavicular, bilateral axillary, peripancreatic, gastrohepatic ligament, celiac, porta hepatis, periaortic, and left external iliac chains. Hypermetabolic mesenteric lymph nodes. Appearance favors lymphoma.  Patient notes some night sweats for 2 weeks. Has lost about 10 pounds in the last month. No acute fevers or chills. Notes the back pain is controlled with when necessary Vicodin.   INTERVAL HISTORY:  Katelyn Reeves is here for her scheduled follow-up regarding her follicular lymphoma after the completion of her 12th maintenance dose of Rituxan. The patient's last visit with [place] was on 06/14/2019. The pt reports that she  is doing well overall.  The pt reports that she is still self-isolating and has been cooking for herself throughout quarantine. She had 2 ingrown toenails and cellulitis in the interim. She treated with warm water soaks and antibiotics. Pt has been to see the Podiatrist who found that the pt treated her infection properly. She has been checking her feet everyday and trying to be very careful. Pt has never been on any medications to control her blood glucose levels. Pt does not remember testing Hgb A1C levels with her PCP or getting a fasting blood glucose test. She is interested in getting the Covid-19 vaccine when it becomes available.    Of note since the patient's last visit, pt has had CT C/A/P ([id number]) ([id number]) completed on 08/30/2019 with results revealing "1. Interval decrease in size of a left retroperitoneal lymph node, measuring 0.9 x 0.8 cm, previously 1.5 x 1.2 cm. No other prominent lymph nodes or lymphadenopathy in the chest, abdomen, or pelvis. 2. Hepatic steatosis. Somewhat coarse contour of the liver, suggestive of cirrhosis. 3. Cholelithiasis without evidence of cholecystitis. 4. Aortic Atherosclerosis (ICD10-I70.0)."  Lab results (08/30/19) of CBC w/diff and CMP is as follows: all values are WNL except for Glucose at 162. 08/30/2019 LDH at 148  On review of systems, pt denies skin rashes and any other symptoms.   MEDICAL HISTORY:  #1 fibrocystic disease of the breast #2 hypertension #3 constipation #4 duplicated right sided ureters #5 imaging concern for possible liver cirrhosis   SURGICAL HISTORY: #1 total abdominal hysterectomy with bilateral salpingo-oophorectomy for ovarian cyst. #2 Lipoma removal left upper back #3 hemorrhoidectomy   SOCIAL HISTORY: Social History   Socioeconomic History  . Marital status:  Divorced    Spouse name: Not on file  . Number of children: Not on file  . Years of education: Not on file  . Highest education level: Not on file    Occupational History  . Not on file  Tobacco Use  . Smoking status: Never Smoker  . Smokeless tobacco: Never Used  Substance and Sexual Activity  . Alcohol use: No  . Drug use: No  . Sexual activity: Not on file  Other Topics Concern  . Not on file  Social History Narrative  . Not on file   Social Determinants of Health   Financial Resource Strain:   . Difficulty of Paying Living Expenses: Not on file  Food Insecurity:   . Worried About Charity fundraiser in the Last Year: Not on file  . Ran Out of Food in the Last Year: Not on file  Transportation Needs:   . Lack of Transportation (Medical): Not on file  . Lack of Transportation (Non-Medical): Not on file  Physical Activity:   . Days of Exercise per Week: Not on file  . Minutes of Exercise per Session: Not on file  Stress:   . Feeling of Stress : Not on file  Social Connections:   . Frequency of Communication with Friends and Family: Not on file  . Frequency of Social Gatherings with Friends and Family: Not on file  . Attends Religious Services: Not on file  . Active Member of Clubs or Organizations: Not on file  . Attends Archivist Meetings: Not on file  . Marital Status: Not on file  Intimate Partner Violence:   . Fear of Current or Ex-Partner: Not on file  . Emotionally Abused: Not on file  . Physically Abused: Not on file  . Sexually Abused: Not on file  Nonsmoker no issues with alcohol use or drug use. 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   FAMILY HISTORY: Notes her mother had ovarian cancer followed by leukemia and died at age 77 years Maternal aunt gastric cancer   ALLERGIES:  is allergic to sulfa antibiotics.  MEDICATIONS:  No current outpatient medications on file.   No current facility-administered medications for this visit.   Facility-Administered Medications Ordered in Other Visits  Medication Dose Route Frequency  Provider Last Rate Last Admin  . sodium chloride flush (NS) 0.9 % injection 10 mL  10 mL Intracatheter PRN Brunetta Genera, MD   10 mL at 07/04/17 1540    REVIEW OF SYSTEMS:   A 10+ POINT REVIEW OF SYSTEMS WAS OBTAINED including neurology, dermatology, psychiatry, cardiac, respiratory, lymph, extremities, GI, GU, Musculoskeletal, constitutional, breasts, reproductive, HEENT.  All pertinent positives are noted in the HPI.  All others are negative.   PHYSICAL EXAMINATION: ECOG PERFORMANCE STATUS: 1 - Symptomatic but completely ambulatory  Vitals:   09/06/19 1143  BP: (!) 173/97  Pulse: 93  Resp: 18  Temp: 97.8 F (36.6 C)  SpO2: 100%   Filed Weights   09/06/19 1143  Weight: 199 lb 11.2 oz (90.6 kg)   .Body mass index is 35.38 kg/m.   GENERAL:alert, in no acute distress and comfortable SKIN: no acute rashes, no significant lesions EYES: conjunctiva are pink and non-injected, sclera anicteric OROPHARYNX: MMM, no exudates, no oropharyngeal erythema or ulceration NECK: supple, no JVD LYMPH:  no palpable lymphadenopathy in the cervical, axillary or inguinal regions LUNGS: clear to auscultation b/l with normal respiratory effort HEART: regular rate &  rhythm ABDOMEN:  normoactive bowel sounds , non tender, not distended. No palpable hepatosplenomegaly.  Extremity: no pedal edema PSYCH: alert & oriented x 3 with fluent speech NEURO: no focal motor/sensory deficits  LABORATORY DATA:  I have reviewed the data as listed  . CBC Latest Ref Rng & Units 08/30/2019 06/14/2019 04/13/2019  WBC 4.0 - 10.5 K/uL 5.6 5.5 5.4  Hemoglobin 12.0 - 15.0 g/dL 12.6 12.4 12.7  Hematocrit 36.0 - 46.0 % 36.9 36.2 37.3  Platelets 150 - 400 K/uL 231 219 223   CBC    Component Value Date/Time   WBC 5.6 08/30/2019 0859   RBC 4.06 08/30/2019 0859   HGB 12.6 08/30/2019 0859   HGB 13.4 05/28/2018 1054   HGB 12.6 09/04/2017 1006   HCT 36.9 08/30/2019 0859   HCT 36.6 09/04/2017 1006   PLT 231  08/30/2019 0859   PLT 248 05/28/2018 1054   PLT 222 09/04/2017 1006   MCV 90.9 08/30/2019 0859   MCV 88.4 09/04/2017 1006   MCH 31.0 08/30/2019 0859   MCHC 34.1 08/30/2019 0859   RDW 12.2 08/30/2019 0859   RDW 13.1 09/04/2017 1006   LYMPHSABS 1.3 08/30/2019 0859   LYMPHSABS 1.1 09/04/2017 1006   MONOABS 0.5 08/30/2019 0859   MONOABS 0.3 09/04/2017 1006   EOSABS 0.2 08/30/2019 0859   EOSABS 0.2 09/04/2017 1006   BASOSABS 0.0 08/30/2019 0859   BASOSABS 0.0 09/04/2017 1006   . CMP Latest Ref Rng & Units 08/30/2019 06/14/2019 04/13/2019  Glucose 70 - 99 mg/dL 162(H) 131(H) 167(H)  BUN 8 - 23 mg/dL 11 8 11   Creatinine 0.44 - 1.00 mg/dL 0.82 0.77 0.79  Sodium 135 - 145 mmol/L 140 141 142  Potassium 3.5 - 5.1 mmol/L 4.1 3.8 4.0  Chloride 98 - 111 mmol/L 105 106 108  CO2 22 - 32 mmol/L 26 25 21(L)  Calcium 8.9 - 10.3 mg/dL 9.1 9.4 9.3  Total Protein 6.5 - 8.1 g/dL 7.2 7.2 7.4  Total Bilirubin 0.3 - 1.2 mg/dL 0.4 0.3 0.3  Alkaline Phos 38 - 126 U/L 73 83 91  AST 15 - 41 U/L 23 20 16   ALT 0 - 44 U/L 21 19 14    . Lab Results  Component Value Date   LDH 148 08/30/2019   Component     Latest Ref Rng & Units 11/12/2016  Hep C Virus Ab     0.0 - 0.9 s/co ratio <0.1  Hepatitis B Surface Ag     Negative Negative  Hep B Core Ab, Tot     Negative Negative       RADIOGRAPHIC STUDIES: I have personally reviewed the radiological images as listed and agreed with the findings in the report. CT Chest W Contrast  Result Date: 08/30/2019 CLINICAL DATA:  Follow-up high-grade follicular lymphoma, induction chemotherapy and maintenance rituximab EXAM: CT CHEST, ABDOMEN, AND PELVIS WITH CONTRAST TECHNIQUE: Multidetector CT imaging of the chest, abdomen and pelvis was performed following the standard protocol during bolus administration of intravenous contrast. CONTRAST:  180m OMNIPAQUE IOHEXOL 300 MG/ML SOLN, additional oral enteric contrast COMPARISON:  11/04/2017, PET-CT, 04/29/2017,  02/21/2017 FINDINGS: CT CHEST FINDINGS Cardiovascular: No significant vascular findings. Normal heart size. No pericardial effusion. Mediastinum/Nodes: No enlarged mediastinal, hilar, or axillary lymph nodes. Thyroid gland, trachea, and esophagus demonstrate no significant findings. Lungs/Pleura: Lungs are clear. No pleural effusion or pneumothorax. Musculoskeletal: No chest wall mass or suspicious bone lesions identified. CT ABDOMEN PELVIS FINDINGS Hepatobiliary: Hepatic steatosis. Somewhat coarse contour of the  liver. Tiny dependent gallstones or gravel in the gallbladder. No gallbladder wall thickening, or biliary dilatation. Pancreas: Unremarkable. No pancreatic ductal dilatation or surrounding inflammatory changes. Spleen: Normal in size without significant abnormality. Adrenals/Urinary Tract: Adrenal glands are unremarkable. Kidneys are normal, without renal calculi, solid lesion, or hydronephrosis. Bladder is unremarkable. Stomach/Bowel: Stomach is within normal limits. Appendix appears normal. No evidence of bowel wall thickening, distention, or inflammatory changes. Vascular/Lymphatic: Aortic atherosclerosis. Interval decrease in size of a left retroperitoneal lymph node, measuring 0.9 x 0.8 cm, previously 1.5 x 1.2 cm (series 2, image 62). Reproductive: Status post hysterectomy. Other: No abdominal wall hernia or abnormality. No abdominopelvic ascites. Musculoskeletal: No acute or significant osseous findings. IMPRESSION: 1. Interval decrease in size of a left retroperitoneal lymph node, measuring 0.9 x 0.8 cm, previously 1.5 x 1.2 cm. No other prominent lymph nodes or lymphadenopathy in the chest, abdomen, or pelvis. 2. Hepatic steatosis. Somewhat coarse contour of the liver, suggestive of cirrhosis. 3. Cholelithiasis without evidence of cholecystitis. 4. Aortic Atherosclerosis (ICD10-I70.0). Electronically Signed   By: Eddie Candle M.D.   On: 08/30/2019 14:16   CT Abdomen Pelvis W Contrast  Result  Date: 08/30/2019 CLINICAL DATA:  Follow-up high-grade follicular lymphoma, induction chemotherapy and maintenance rituximab EXAM: CT CHEST, ABDOMEN, AND PELVIS WITH CONTRAST TECHNIQUE: Multidetector CT imaging of the chest, abdomen and pelvis was performed following the standard protocol during bolus administration of intravenous contrast. CONTRAST:  152m OMNIPAQUE IOHEXOL 300 MG/ML SOLN, additional oral enteric contrast COMPARISON:  11/04/2017, PET-CT, 04/29/2017, 02/21/2017 FINDINGS: CT CHEST FINDINGS Cardiovascular: No significant vascular findings. Normal heart size. No pericardial effusion. Mediastinum/Nodes: No enlarged mediastinal, hilar, or axillary lymph nodes. Thyroid gland, trachea, and esophagus demonstrate no significant findings. Lungs/Pleura: Lungs are clear. No pleural effusion or pneumothorax. Musculoskeletal: No chest wall mass or suspicious bone lesions identified. CT ABDOMEN PELVIS FINDINGS Hepatobiliary: Hepatic steatosis. Somewhat coarse contour of the liver. Tiny dependent gallstones or gravel in the gallbladder. No gallbladder wall thickening, or biliary dilatation. Pancreas: Unremarkable. No pancreatic ductal dilatation or surrounding inflammatory changes. Spleen: Normal in size without significant abnormality. Adrenals/Urinary Tract: Adrenal glands are unremarkable. Kidneys are normal, without renal calculi, solid lesion, or hydronephrosis. Bladder is unremarkable. Stomach/Bowel: Stomach is within normal limits. Appendix appears normal. No evidence of bowel wall thickening, distention, or inflammatory changes. Vascular/Lymphatic: Aortic atherosclerosis. Interval decrease in size of a left retroperitoneal lymph node, measuring 0.9 x 0.8 cm, previously 1.5 x 1.2 cm (series 2, image 62). Reproductive: Status post hysterectomy. Other: No abdominal wall hernia or abnormality. No abdominopelvic ascites. Musculoskeletal: No acute or significant osseous findings. IMPRESSION: 1. Interval decrease  in size of a left retroperitoneal lymph node, measuring 0.9 x 0.8 cm, previously 1.5 x 1.2 cm. No other prominent lymph nodes or lymphadenopathy in the chest, abdomen, or pelvis. 2. Hepatic steatosis. Somewhat coarse contour of the liver, suggestive of cirrhosis. 3. Cholelithiasis without evidence of cholecystitis. 4. Aortic Atherosclerosis (ICD10-I70.0). Electronically Signed   By: AEddie CandleM.D.   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  Presented with Generalized FDG avid lymphadenopathy - predominantly in the abdomen/Retroperitoneum. Also noted to have left supraclavicular and bilateral axillary left more than right FDG avid lymphadenopathy. Based on imaging this would represent at least Stage III disease if this were a  lymphoma. LDH level is not significantly elevated. Blood counts are stable. PET/CT scan does show fairly active disease which is FDG avid LNadenopathy and FDG avid splenomegaly. Patient has some constitutional symptoms with about a 10 pound weight loss and some night sweats. Hepatitis profile negative. No other obvious new focal symptoms.  Patient has had an ECHO which shows nl EF  Patient is status post 6 cycles of R CHOP with no prohibitive toxicities.  PET/CT on 8/72018 - showed complete metabolic response with no metabolically active disease. A few borderline lymph nodes in the retroperitoneum.  CT chest/abd/pelvis 11/04/2017:  No new or progressive findings to suggest recurrent disease. The small abdominal retroperitoneal lymph nodes are unchanged when comparing to PET-CT of 04/29/2017.   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 Improved with massage therapy  #4 h/o mild shingles   PLAN:  -Discussed  pt labwork, 08/30/19; blood counts and chemistries look good. LDH is normal.  -Discussed 08/30/2019 CT C/A/P ([id number]) ([id number]) which revealed no progression of FL and no signs of active lymphoma. Signs of liver cirrhosis. -Recommended pt f/u with PCP for an evaluation of her liver and elevated blood glucose level -No lab or clinical evidence of lymphoma progression at this time.  -Will continue monitoring with labs and clinical visits  -Will see back in 4 months with labs   FOLLOW UP: RTC with Dr Irene Limbo with labs in 4 months  The total time spent in the appt was 15 minutes and more than 50% was on counseling and direct patient cares.  All of the patient's questions were answered with apparent satisfaction. The patient knows to call the clinic with any problems, questions or concerns.    Sullivan Lone MD [place] AAHIVMS [facility] Hematology/Oncology Physician [facility]  (Office):       [phone] (Work cell):  [phone] (Fax):           [phone]  I, Yevette Edwards, am acting as a scribe for Dr. Sullivan Lone.   .I have reviewed the above documentation for accuracy and completeness, and I agree with the above. Brunetta Genera MD

## 2019-09-07 ENCOUNTER — Telehealth: Payer: Self-pay | Admitting: Hematology

## 2019-09-07 NOTE — Telephone Encounter (Signed)
Scheduled appt per 12/14 los.  Printed and mailed appt calendar

## 2019-09-20 ENCOUNTER — Encounter: Payer: Self-pay | Admitting: Hematology

## 2019-09-20 NOTE — Telephone Encounter (Signed)
Contacted patient. She states she did not have symptoms - it was an annual mammogram reminder. She wondered if CT would have same information as mammogram. Informed her that information obtained through mammogram not same as through CT. She is trying to avoid unnecessary trips outside her house.  She verbalized understanding and states she will attempt to schedule before her next appt with Dr. Irene Limbo.

## 2019-09-22 ENCOUNTER — Ambulatory Visit (INDEPENDENT_AMBULATORY_CARE_PROVIDER_SITE_OTHER): Payer: Medicare Other | Admitting: Podiatry

## 2019-09-22 ENCOUNTER — Other Ambulatory Visit: Payer: Self-pay

## 2019-09-22 DIAGNOSIS — L600 Ingrowing nail: Secondary | ICD-10-CM

## 2019-09-22 MED ORDER — DOXYCYCLINE HYCLATE 100 MG PO TABS: 100.0000 mg | ORAL_TABLET | Freq: Two times a day (BID) | ORAL | 0 refills | Status: DC

## 2019-09-22 MED ORDER — GENTAMICIN SULFATE 0.1 % EX CREA: 1.0000 "application " | TOPICAL_CREAM | Freq: Two times a day (BID) | CUTANEOUS | 1 refills | Status: DC

## 2019-09-22 NOTE — Patient Instructions (Addendum)

## 2019-09-29 NOTE — Progress Notes (Signed)
   Subjective: Patient presents today for evaluation of throbbing pain to the medial border of the right great toe that began about two weeks ago. She reports associated redness and swelling. Patient is concerned for possible ingrown nail. Touching the toe increases the pain. She has been soaking the toe in Epsom salt and keeping it elevated for treatment. Patient presents today for further treatment and evaluation.  Past Medical History:  Diagnosis Date  . Back pain   . Headache    migraines  . History of chemotherapy   . Shingles   . Swollen lymph nodes    left axilla, rectoperitonal     Objective:  General: Well developed, nourished, in no acute distress, alert and oriented x3   Dermatology: Skin is warm, dry and supple bilateral. Medial border of the right great toe appears to be erythematous with evidence of an ingrowing nail. Pain on palpation noted to the border of the nail fold. The remaining nails appear unremarkable at this time. There are no open sores, lesions.  Vascular: Dorsalis Pedis artery and Posterior Tibial artery pedal pulses palpable. No lower extremity edema noted.   Neruologic: Grossly intact via light touch bilateral.  Musculoskeletal: Muscular strength within normal limits in all groups bilateral. Normal range of motion noted to all pedal and ankle joints.   Assesement: #1 Paronychia with ingrowing nail medial border of the right great toe #2 Pain in toe #3 Incurvated nail  Plan of Care:  1. Patient evaluated.  2. Discussed treatment alternatives and plan of care. Explained nail avulsion procedure and post procedure course to patient. 3. Patient opted for permanent partial nail avulsion of the medial border of the right great toe.  4. Prior to procedure, local anesthesia infiltration utilized using 3 ml of a 50:50 mixture of 2% plain lidocaine and 0.5% plain marcaine in a normal hallux block fashion and a betadine prep performed.  5. Partial permanent nail  avulsion with chemical matrixectomy performed using [id number] applications of phenol followed by alcohol flush.  6. Light dressing applied. 7. Prescription for Gentamicin cream provided to patient to use daily with a bandage.  8. Prescription for Doxycycline 100 mg #14 provided to patient.  9. Return to clinic in 2 weeks.  Edrick Kins, DPM [facility]  Dr. Edrick Kins, [address] [place]                                        [place], [place] [address]                Office [phone]  Fax [phone]

## 2019-10-06 ENCOUNTER — Other Ambulatory Visit: Payer: Self-pay

## 2019-10-06 ENCOUNTER — Ambulatory Visit (INDEPENDENT_AMBULATORY_CARE_PROVIDER_SITE_OTHER): Payer: Medicare Other | Admitting: Podiatry

## 2019-10-06 DIAGNOSIS — L600 Ingrowing nail: Secondary | ICD-10-CM | POA: Diagnosis not present

## 2019-10-09 NOTE — Progress Notes (Signed)
   Subjective: 69 y.o. female presents today status post permanent nail avulsion procedure of the medial border of the right great toe that was performed on 09/22/2019. She reports some burning and redness. She has been soaking the toe daily and applying Gentamicin as directed. There are no aggravating factors noted. Patient is here for further evaluation and treatment.   Past Medical History:  Diagnosis Date  . Back pain   . Headache    migraines  . History of chemotherapy   . Shingles   . Swollen lymph nodes    left axilla, rectoperitonal     Objective: Skin is warm, dry and supple. Nail and respective nail fold appears to be healing appropriately. Open wound to the associated nail fold with a granular wound base and moderate amount of fibrotic tissue. Minimal drainage noted. Mild erythema around the periungual region likely due to phenol chemical matricectomy.  Assessment: #1 postop permanent partial nail avulsion medial border right hallux  #2 open wound periungual nail fold of respective digit.   Plan of care: #1 patient was evaluated  #2 debridement of open wound was performed to the periungual border of the respective toe using a currette. Antibiotic ointment and Band-Aid was applied. #3 patient is to return to clinic on a PRN basis.   Edrick Kins, DPM [facility]  Dr. Edrick Kins, [address]                                        [place], [place] [address]                Office [phone]  Fax [phone]

## 2019-11-18 DIAGNOSIS — Z23 Encounter for immunization: Secondary | ICD-10-CM | POA: Diagnosis not present

## 2019-12-17 DIAGNOSIS — Z23 Encounter for immunization: Secondary | ICD-10-CM | POA: Diagnosis not present

## 2020-01-05 NOTE — Progress Notes (Signed)
HEMATOLOGY/ONCOLOGY CLINIC NOTE  Date of Service: 01/06/20    Patient Care Team: Brunetta Genera, MD as PCP - General (Hematology)   CHIEF COMPLAINTS/PURPOSE OF CONSULTATION:  F/u for Follicular lymphoma prior to next cycle of Maintenance Rituxan   HISTORY OF PRESENTING ILLNESS:  Katelyn Reeves is a wonderful 69 y.o. female who has been referred to [place] by Dr .Irene Limbo, Cloria [place], MD for evaluation and management of suspected non-Hodgkin's lymphoma.  Patient notes that she presented with back pain for 4-6 weeks and eventually had pain radiating to her right inguinal area. She notes that she has had interstitial cystitis in the past. She was thought to have a urinary tract infection and was treated with Macrodantin. Says the pain got worse and started radiating into the right groin she had a CT of the abdomen renal stone protocol on 10/25/2016 which showed extensive retroperitoneal lymphadenopathy concerning for lymphoproliferative disorder. She was incidentally noted to have a nodular border of the liver with possible liver cirrhosis. No urinary stones noted.  She subsequently had a PET CT scan ordered by her primary care physician which was done on 11/07/2016. This showed diffuse hypermetabolic spleen with mild splenomegaly. Hypermetabolic adenopathy in the left supraclavicular, bilateral axillary, peripancreatic, gastrohepatic ligament, celiac, porta hepatis, periaortic, and left external iliac chains. Hypermetabolic mesenteric lymph nodes. Appearance favors lymphoma.  Patient notes some night sweats for 2 weeks. Has lost about 10 pounds in the last month. No acute fevers or chills. Notes the back pain is controlled with when necessary Vicodin.  INTERVAL HISTORY:  Katelyn Reeves is here for her scheduled follow-up regarding her follicular lymphoma. The patient's last visit with [place] was on 09/06/19. The pt reports that she is doing well overall.  The pt reports she is good. Pt has  gotten both doses of COVID19 vaccine. Pt had some fever, chills and rash from [place] vaccination. She has gotten her right ingrown toenail fixed and is starting to have trouble with left toe. Pt has not done any major traveling recently.   Lab results today (01/06/20) of CBC w/diff and CMP is as follows: all values are WNL except for glucose at 110. 01/06/20 of LDH at 171  On review of systems, pt reports weight gain, healthy appetite, exercise and denies fever, chills, night sweats, new lumps/bumps, rashes, irregular bowl habits and any other symptoms.   MEDICAL HISTORY:  #1 fibrocystic disease of the breast #2 hypertension #3 constipation #4 duplicated right sided ureters #5 imaging concern for possible liver cirrhosis   SURGICAL HISTORY: #1 total abdominal hysterectomy with bilateral salpingo-oophorectomy for ovarian cyst. #2 Lipoma removal left upper back #3 hemorrhoidectomy  SOCIAL HISTORY: Social History   Socioeconomic History  . Marital status: Divorced    Spouse name: Not on file  . Number of children: Not on file  . Years of education: Not on file  . Highest education level: Not on file  Occupational History  . Not on file  Tobacco Use  . Smoking status: Never Smoker  . Smokeless tobacco: Never Used  Substance and Sexual Activity  . Alcohol use: No  . Drug use: No  . Sexual activity: Not on file  Other Topics Concern  . Not on file  Social History Narrative  . Not on file   Social Determinants of Health   Financial Resource Strain:   . Difficulty of Paying Living Expenses:   Food Insecurity:   . Worried About Charity fundraiser in the  Last Year:   . [place] in the Last Year:   Transportation Needs:   . Film/video editor (Medical):   Marland Kitchen Lack of Transportation (Non-Medical):   Physical Activity:   . Days of Exercise per Week:   . Minutes of Exercise per Session:   Stress:   . Feeling of Stress :   Social Connections:   . Frequency of  Communication with Friends and Family:   . Frequency of Social Gatherings with Friends and Family:   . Attends Religious Services:   . Active Member of Clubs or Organizations:   . Attends Archivist Meetings:   Marland Kitchen Marital Status:   Intimate Partner Violence:   . Fear of Current or Ex-Partner:   . Emotionally Abused:   Marland Kitchen Physically Abused:   . Sexually Abused:   Nonsmoker no issues with alcohol use or drug use. 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   FAMILY HISTORY: Notes her mother had ovarian cancer followed by leukemia and died at age 46 years Maternal aunt gastric cancer   ALLERGIES:  is allergic to sulfa antibiotics.  MEDICATIONS:  Current Outpatient Medications  Medication Sig Dispense Refill  . doxycycline (VIBRA-TABS) 100 MG tablet Take 1 tablet (100 mg total) by mouth 2 (two) times daily. 20 tablet 0  . gentamicin cream (GARAMYCIN) 0.1 % Apply 1 application topically 2 (two) times daily. 15 g 1   No current facility-administered medications for this visit.   Facility-Administered Medications Ordered in Other Visits  Medication Dose Route Frequency Provider Last Rate Last Admin  . sodium chloride flush (NS) 0.9 % injection 10 mL  10 mL Intracatheter PRN Brunetta Genera, MD   10 mL at 07/04/17 1540    REVIEW OF SYSTEMS:   A 10+ POINT REVIEW OF SYSTEMS WAS OBTAINED including neurology, dermatology, psychiatry, cardiac, respiratory, lymph, extremities, GI, GU, Musculoskeletal, constitutional, breasts, reproductive, HEENT.  All pertinent positives are noted in the HPI.  All others are negative.   PHYSICAL EXAMINATION: ECOG PERFORMANCE STATUS: 1 - Symptomatic but completely ambulatory  Vitals:   01/06/20 1204  BP: (!) 161/75  Pulse: 85  Resp: 18  Temp: 98.3 F (36.8 C)  SpO2: 98%   Filed Weights   01/06/20 1204  Weight: 200 lb 1.6 oz (90.8 kg)   .Body mass index is 35.45 kg/m.    GENERAL:alert, in no acute distress and comfortable SKIN: no acute rashes, no significant lesions EYES: conjunctiva are pink and non-injected, sclera anicteric OROPHARYNX: MMM, no exudates, no oropharyngeal erythema or ulceration NECK: supple, no JVD LYMPH:  no palpable lymphadenopathy in the cervical, axillary or inguinal regions LUNGS: clear to auscultation b/l with normal respiratory effort HEART: regular rate & rhythm ABDOMEN:  normoactive bowel sounds , non tender, not distended. Extremity: no pedal edema PSYCH: alert & oriented x 3 with fluent speech NEURO: no focal motor/sensory deficits  LABORATORY DATA:  I have reviewed the data as listed  . CBC Latest Ref Rng & Units 01/06/2020 08/30/2019 06/14/2019  WBC 4.0 - 10.5 K/uL 5.5 5.6 5.5  Hemoglobin 12.0 - 15.0 g/dL 13.1 12.6 12.4  Hematocrit 36.0 - 46.0 % 39.0 36.9 36.2  Platelets 150 - 400 K/uL 222 231 219   CBC    Component Value Date/Time   WBC 5.5 01/06/2020 1135   RBC 4.30 01/06/2020 1135   HGB 13.1 01/06/2020 1135   HGB 13.4 05/28/2018 1054   HGB 12.6 09/04/2017  1006   HCT 39.0 01/06/2020 1135   HCT 36.6 09/04/2017 1006   PLT 222 01/06/2020 1135   PLT 248 05/28/2018 1054   PLT 222 09/04/2017 1006   MCV 90.7 01/06/2020 1135   MCV 88.4 09/04/2017 1006   MCH 30.5 01/06/2020 1135   MCHC 33.6 01/06/2020 1135   RDW 12.4 01/06/2020 1135   RDW 13.1 09/04/2017 1006   LYMPHSABS 1.5 01/06/2020 1135   LYMPHSABS 1.1 09/04/2017 1006   MONOABS 0.5 01/06/2020 1135   MONOABS 0.3 09/04/2017 1006   EOSABS 0.3 01/06/2020 1135   EOSABS 0.2 09/04/2017 1006   BASOSABS 0.1 01/06/2020 1135   BASOSABS 0.0 09/04/2017 1006   . CMP Latest Ref Rng & Units 01/06/2020 08/30/2019 06/14/2019  Glucose 70 - 99 mg/dL 110(H) 162(H) 131(H)  BUN 8 - 23 mg/dL 11 11 8   Creatinine 0.44 - 1.00 mg/dL 0.79 0.82 0.77  Sodium 135 - 145 mmol/L 141 140 141  Potassium 3.5 - 5.1 mmol/L 4.2 4.1 3.8  Chloride 98 - 111 mmol/L 107 105 106  CO2 22 - 32  mmol/L 24 26 25   Calcium 8.9 - 10.3 mg/dL 9.9 9.1 9.4  Total Protein 6.5 - 8.1 g/dL 7.7 7.2 7.2  Total Bilirubin 0.3 - 1.2 mg/dL 0.6 0.4 0.3  Alkaline Phos 38 - 126 U/L 75 73 83  AST 15 - 41 U/L 24 23 20   ALT 0 - 44 U/L 20 21 19    . Lab Results  Component Value Date   LDH 171 01/06/2020   Component     Latest Ref Rng & Units 11/12/2016  Hep C Virus Ab     0.0 - 0.9 s/co ratio <0.1  Hepatitis B Surface Ag     Negative Negative  Hep B Core Ab, Tot     Negative Negative       RADIOGRAPHIC STUDIES: I have personally reviewed the radiological images as listed and agreed with the findings in the report. No results found.   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  Presented with Generalized FDG avid lymphadenopathy - predominantly in the abdomen/Retroperitoneum. Also noted to have left supraclavicular and bilateral axillary left more than right FDG avid lymphadenopathy. Based on imaging this would represent at least Stage III disease if this were a lymphoma. LDH level is not significantly elevated. Blood counts are stable. PET/CT scan does show fairly active disease which is FDG avid LNadenopathy and FDG avid splenomegaly. Patient has some constitutional symptoms with about a 10 pound weight loss and some night sweats. Hepatitis profile negative. No other obvious new focal symptoms.  Patient has had an ECHO which shows nl EF  Patient is status post 6 cycles of R CHOP with no prohibitive toxicities.  PET/CT on 8/72018 - showed complete metabolic response with no metabolically active disease. A few borderline lymph nodes in the retroperitoneum.  CT chest/abd/pelvis 11/04/2017:  No new or progressive findings to suggest recurrent disease. The small abdominal retroperitoneal lymph nodes are unchanged when comparing to  PET-CT of 04/29/2017.   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 Improved with massage therapy  #4 h/o mild shingles  #5 Fatty liver/NASH ? cirrhosis  PLAN:  -Discussed pt labwork today, 01/06/20; of CBC w/diff and CMP is as follows:  all values are WNL except for glucose at 110. -Discussed 01/06/20 of LDH at 171 -Advised on Fatty liver  -Advised connecting with Hepatologist- wants to revisit next visit  -Recommended that the pt continue to eat well, drink at least 48-64 oz of water each day, and walk 20-30 minutes each day.  -Recommended pt f/u with PCP for an evaluation of her liver and elevated blood glucose level -No lab or clinical evidence of lymphoma progression at this time.  -Recommends f/u with PCP -Recommends [place]  abd with elastography in 3 months -Will continue monitoring with labs and clinical visits  -Will get repeat scans in 1 year -Will see back in 4 months  FOLLOW UP: RTC with Dr Irene Limbo with labs in 4 months [place] abd with elastography in 3 months  The total time spent in the appt was 30 minutes and more than 50% was on counseling and direct patient cares.  All of the patient's questions were answered with apparent satisfaction. The patient knows to call the clinic with any problems, questions or concerns.  Sullivan Lone MD [place] AAHIVMS [facility] Hematology/Oncology Physician [facility]  (Office):       [phone] (Work cell):  [phone] (Fax):           [phone]  I, Dawayne Cirri am acting as a scribe for Dr. Sullivan Lone.   .I have reviewed the above documentation for accuracy and completeness, and I agree with the above. Brunetta Genera MD

## 2020-01-06 ENCOUNTER — Inpatient Hospital Stay: Payer: Medicare Other

## 2020-01-06 ENCOUNTER — Inpatient Hospital Stay: Payer: Medicare Other | Attending: Hematology | Admitting: Hematology

## 2020-01-06 ENCOUNTER — Other Ambulatory Visit: Payer: Self-pay

## 2020-01-06 VITALS — BP 161/75 | HR 85 | Temp 98.3°F | Resp 18 | Ht 63.0 in | Wt 200.1 lb

## 2020-01-06 DIAGNOSIS — R634 Abnormal weight loss: Secondary | ICD-10-CM | POA: Diagnosis not present

## 2020-01-06 DIAGNOSIS — K760 Fatty (change of) liver, not elsewhere classified: Secondary | ICD-10-CM | POA: Insufficient documentation

## 2020-01-06 DIAGNOSIS — Z90710 Acquired absence of both cervix and uterus: Secondary | ICD-10-CM | POA: Diagnosis not present

## 2020-01-06 DIAGNOSIS — Z9079 Acquired absence of other genital organ(s): Secondary | ICD-10-CM | POA: Insufficient documentation

## 2020-01-06 DIAGNOSIS — Z806 Family history of leukemia: Secondary | ICD-10-CM | POA: Diagnosis not present

## 2020-01-06 DIAGNOSIS — M25511 Pain in right shoulder: Secondary | ICD-10-CM | POA: Diagnosis not present

## 2020-01-06 DIAGNOSIS — C8248 Follicular lymphoma grade IIIb, lymph nodes of multiple sites: Secondary | ICD-10-CM

## 2020-01-06 DIAGNOSIS — I10 Essential (primary) hypertension: Secondary | ICD-10-CM | POA: Diagnosis not present

## 2020-01-06 DIAGNOSIS — K7581 Nonalcoholic steatohepatitis (NASH): Secondary | ICD-10-CM

## 2020-01-06 DIAGNOSIS — Z8041 Family history of malignant neoplasm of ovary: Secondary | ICD-10-CM | POA: Diagnosis not present

## 2020-01-06 DIAGNOSIS — Z90722 Acquired absence of ovaries, bilateral: Secondary | ICD-10-CM | POA: Diagnosis not present

## 2020-01-06 LAB — CBC WITH DIFFERENTIAL/PLATELET
Abs Immature Granulocytes: 0.01 10*3/uL (ref 0.00–0.07)
Basophils Absolute: 0.1 10*3/uL (ref 0.0–0.1)
Basophils Relative: 1 %
Eosinophils Absolute: 0.3 10*3/uL (ref 0.0–0.5)
Eosinophils Relative: 5 %
HCT: 39 % (ref 36.0–46.0)
Hemoglobin: 13.1 g/dL (ref 12.0–15.0)
Immature Granulocytes: 0 %
Lymphocytes Relative: 26 %
Lymphs Abs: 1.5 10*3/uL (ref 0.7–4.0)
MCH: 30.5 pg (ref 26.0–34.0)
MCHC: 33.6 g/dL (ref 30.0–36.0)
MCV: 90.7 fL (ref 80.0–100.0)
Monocytes Absolute: 0.5 10*3/uL (ref 0.1–1.0)
Monocytes Relative: 8 %
Neutro Abs: 3.3 10*3/uL (ref 1.7–7.7)
Neutrophils Relative %: 60 %
Platelets: 222 10*3/uL (ref 150–400)
RBC: 4.3 MIL/uL (ref 3.87–5.11)
RDW: 12.4 % (ref 11.5–15.5)
WBC: 5.5 10*3/uL (ref 4.0–10.5)
nRBC: 0 % (ref 0.0–0.2)

## 2020-01-06 LAB — CMP (CANCER CENTER ONLY)
ALT: 20 U/L (ref 0–44)
AST: 24 U/L (ref 15–41)
Albumin: 4.3 g/dL (ref 3.5–5.0)
Alkaline Phosphatase: 75 U/L (ref 38–126)
Anion gap: 10 (ref 5–15)
BUN: 11 mg/dL (ref 8–23)
CO2: 24 mmol/L (ref 22–32)
Calcium: 9.9 mg/dL (ref 8.9–10.3)
Chloride: 107 mmol/L (ref 98–111)
Creatinine: 0.79 mg/dL (ref 0.44–1.00)
GFR, Est AFR Am: >60 mL/min (ref >60)
GFR, Estimated: >60 mL/min (ref >60)
Glucose, Bld: 110 mg/dL — ABNORMAL HIGH (ref 70–99)
Potassium: 4.2 mmol/L (ref 3.5–5.1)
Sodium: 141 mmol/L (ref 135–145)
Total Bilirubin: 0.6 mg/dL (ref 0.3–1.2)
Total Protein: 7.7 g/dL (ref 6.5–8.1)

## 2020-01-06 LAB — LACTATE DEHYDROGENASE: LDH: 171 U/L (ref 98–192)

## 2020-03-15 ENCOUNTER — Other Ambulatory Visit (HOSPITAL_COMMUNITY): Payer: Self-pay | Admitting: Internal Medicine

## 2020-03-15 DIAGNOSIS — Z1231 Encounter for screening mammogram for malignant neoplasm of breast: Secondary | ICD-10-CM

## 2020-03-20 ENCOUNTER — Other Ambulatory Visit: Payer: Self-pay

## 2020-03-20 ENCOUNTER — Ambulatory Visit (HOSPITAL_COMMUNITY)
Admission: RE | Admit: 2020-03-20 | Discharge: 2020-03-20 | Disposition: A | Discharge status: Home / Self Care | Payer: Medicare Other | Source: Ambulatory Visit | Attending: Internal Medicine | Admitting: Internal Medicine

## 2020-03-20 DIAGNOSIS — Z1231 Encounter for screening mammogram for malignant neoplasm of breast: Secondary | ICD-10-CM | POA: Diagnosis not present

## 2020-03-25 ENCOUNTER — Encounter: Payer: Self-pay | Admitting: Hematology

## 2020-04-06 ENCOUNTER — Ambulatory Visit (HOSPITAL_COMMUNITY)
Admission: RE | Admit: 2020-04-06 | Discharge: 2020-04-06 | Disposition: A | Discharge status: Home / Self Care | Payer: Medicare Other | Source: Ambulatory Visit | Attending: Hematology | Admitting: Hematology

## 2020-04-06 ENCOUNTER — Other Ambulatory Visit: Payer: Self-pay

## 2020-04-06 DIAGNOSIS — K7689 Other specified diseases of liver: Secondary | ICD-10-CM | POA: Diagnosis not present

## 2020-04-06 DIAGNOSIS — K7581 Nonalcoholic steatohepatitis (NASH): Secondary | ICD-10-CM | POA: Insufficient documentation

## 2020-04-06 DIAGNOSIS — C8248 Follicular lymphoma grade IIIb, lymph nodes of multiple sites: Secondary | ICD-10-CM | POA: Diagnosis not present

## 2020-05-08 ENCOUNTER — Other Ambulatory Visit: Payer: Self-pay

## 2020-05-08 ENCOUNTER — Inpatient Hospital Stay (HOSPITAL_BASED_OUTPATIENT_CLINIC_OR_DEPARTMENT_OTHER): Payer: Medicare Other | Admitting: Hematology

## 2020-05-08 ENCOUNTER — Inpatient Hospital Stay: Payer: Medicare Other | Attending: Hematology

## 2020-05-08 VITALS — BP 180/84 | HR 88 | Temp 98.0°F | Resp 18 | Ht 63.0 in | Wt 192.0 lb

## 2020-05-08 DIAGNOSIS — Z808 Family history of malignant neoplasm of other organs or systems: Secondary | ICD-10-CM

## 2020-05-08 DIAGNOSIS — Z8041 Family history of malignant neoplasm of ovary: Secondary | ICD-10-CM | POA: Diagnosis not present

## 2020-05-08 DIAGNOSIS — Z800 Family history of malignant neoplasm of digestive organs: Secondary | ICD-10-CM | POA: Insufficient documentation

## 2020-05-08 DIAGNOSIS — Z9079 Acquired absence of other genital organ(s): Secondary | ICD-10-CM | POA: Diagnosis not present

## 2020-05-08 DIAGNOSIS — Z90722 Acquired absence of ovaries, bilateral: Secondary | ICD-10-CM

## 2020-05-08 DIAGNOSIS — C8248 Follicular lymphoma grade IIIb, lymph nodes of multiple sites: Secondary | ICD-10-CM

## 2020-05-08 DIAGNOSIS — Z90710 Acquired absence of both cervix and uterus: Secondary | ICD-10-CM | POA: Insufficient documentation

## 2020-05-08 DIAGNOSIS — K7581 Nonalcoholic steatohepatitis (NASH): Secondary | ICD-10-CM

## 2020-05-08 LAB — CMP (CANCER CENTER ONLY)
ALT: 18 U/L (ref 0–44)
AST: 21 U/L (ref 15–41)
Albumin: 4.2 g/dL (ref 3.5–5.0)
Alkaline Phosphatase: 73 U/L (ref 38–126)
Anion gap: 10 (ref 5–15)
BUN: 12 mg/dL (ref 8–23)
CO2: 24 mmol/L (ref 22–32)
Calcium: 10 mg/dL (ref 8.9–10.3)
Chloride: 105 mmol/L (ref 98–111)
Creatinine: 0.87 mg/dL (ref 0.44–1.00)
GFR, Est AFR Am: >60 mL/min (ref >60)
GFR, Estimated: >60 mL/min (ref >60)
Glucose, Bld: 146 mg/dL — ABNORMAL HIGH (ref 70–99)
Potassium: 3.9 mmol/L (ref 3.5–5.1)
Sodium: 139 mmol/L (ref 135–145)
Total Bilirubin: 0.4 mg/dL (ref 0.3–1.2)
Total Protein: 7.4 g/dL (ref 6.5–8.1)

## 2020-05-08 LAB — CBC WITH DIFFERENTIAL/PLATELET
Abs Immature Granulocytes: 0.02 10*3/uL (ref 0.00–0.07)
Basophils Absolute: 0.1 10*3/uL (ref 0.0–0.1)
Basophils Relative: 1 %
Eosinophils Absolute: 0.2 10*3/uL (ref 0.0–0.5)
Eosinophils Relative: 3 %
HCT: 35.5 % — ABNORMAL LOW (ref 36.0–46.0)
Hemoglobin: 12.3 g/dL (ref 12.0–15.0)
Immature Granulocytes: 0 %
Lymphocytes Relative: 26 %
Lymphs Abs: 1.6 10*3/uL (ref 0.7–4.0)
MCH: 31.1 pg (ref 26.0–34.0)
MCHC: 34.6 g/dL (ref 30.0–36.0)
MCV: 89.9 fL (ref 80.0–100.0)
Monocytes Absolute: 0.4 10*3/uL (ref 0.1–1.0)
Monocytes Relative: 7 %
Neutro Abs: 3.9 10*3/uL (ref 1.7–7.7)
Neutrophils Relative %: 63 %
Platelets: 230 10*3/uL (ref 150–400)
RBC: 3.95 MIL/uL (ref 3.87–5.11)
RDW: 11.9 % (ref 11.5–15.5)
WBC: 6.1 10*3/uL (ref 4.0–10.5)
nRBC: 0 % (ref 0.0–0.2)

## 2020-05-08 LAB — LACTATE DEHYDROGENASE: LDH: 154 U/L (ref 98–192)

## 2020-05-08 NOTE — Progress Notes (Signed)
HEMATOLOGY/ONCOLOGY CLINIC NOTE  Date of Service: 05/08/20    Patient Care Team: Asencion Noble, MD as PCP - General (Internal Medicine)   CHIEF COMPLAINTS/PURPOSE OF CONSULTATION:  F/u for Follicular lymphoma prior to next cycle of Maintenance Rituxan   HISTORY OF PRESENTING ILLNESS:  Katelyn Reeves is a wonderful 69 y.o. female who has been referred to [place] by Dr .Asencion Noble, MD for evaluation and management of suspected non-Hodgkin's lymphoma.  Patient notes that she presented with back pain for 4-6 weeks and eventually had pain radiating to her right inguinal area. She notes that she has had interstitial cystitis in the past. She was thought to have a urinary tract infection and was treated with Macrodantin. Says the pain got worse and started radiating into the right groin she had a CT of the abdomen renal stone protocol on 10/25/2016 which showed extensive retroperitoneal lymphadenopathy concerning for lymphoproliferative disorder. She was incidentally noted to have a nodular border of the liver with possible liver cirrhosis. No urinary stones noted.  She subsequently had a PET CT scan ordered by her primary care physician which was done on 11/07/2016. This showed diffuse hypermetabolic spleen with mild splenomegaly. Hypermetabolic adenopathy in the left supraclavicular, bilateral axillary, peripancreatic, gastrohepatic ligament, celiac, porta hepatis, periaortic, and left external iliac chains. Hypermetabolic mesenteric lymph nodes. Appearance favors lymphoma.  Patient notes some night sweats for 2 weeks. Has lost about 10 pounds in the last month. No acute fevers or chills. Notes the back pain is controlled with when necessary Vicodin.  INTERVAL HISTORY:  Katelyn Reeves is here for her scheduled follow-up regarding her follicular lymphoma. The patient's last visit with [place] was on 01/06/2020. The pt reports that she is doing well overall.  The pt reports that she is walking about 1 mile  every other day for activity. She had a fall in the interim, trying to chase an insect in her home. Pt has a bump on her left arm that could have been a result of her fall. Pt had a UTI last month that resolved with antibiotics.   Of note since the patient's last visit, pt has had [place] Abd RUQ w/Elastography ([id number]) completed on 04/06/2020 with results revealing "ULTRASOUND RUQ: Increased liver echogenicity.  Normal biliary tree ULTRASOUND HEPATIC ELASTOGRAPHY: Median kPa:  3.4  Diagnostic category:  < or = 5 kPa: high probability of being normal."  Pt has had MM Breast Bilateral ([id number]) completed on 03/20/2020 with results revealing "Negative."  Lab results today (05/08/20) of CBC w/diff and CMP is as follows: all values are WNL except for HCT at 35.5, Glucose at 146. 05/08/2020 LDH at 154  On review of systems, pt denies SOB, rash, fevers, chills, night sweats, unexpected weight loss, new lumps/bumps and any other symptoms.    MEDICAL HISTORY:  #1 fibrocystic disease of the breast #2 hypertension #3 constipation #4 duplicated right sided ureters #5 imaging concern for possible liver cirrhosis   SURGICAL HISTORY: #1 total abdominal hysterectomy with bilateral salpingo-oophorectomy for ovarian cyst. #2 Lipoma removal left upper back #3 hemorrhoidectomy  SOCIAL HISTORY: Social History   Socioeconomic History  . Marital status: Divorced    Spouse name: Not on file  . Number of children: Not on file  . Years of education: Not on file  . Highest education level: Not on file  Occupational History  . Not on file  Tobacco Use  . Smoking status: Never Smoker  . Smokeless tobacco: Never Used  Vaping Use  .  Vaping Use: Never used  Substance and Sexual Activity  . Alcohol use: No  . Drug use: No  . Sexual activity: Not on file  Other Topics Concern  . Not on file  Social History Narrative  . Not on file   Social Determinants of Health   Financial Resource Strain:   .  Difficulty of Paying Living Expenses:   Food Insecurity:   . Worried About Charity fundraiser in the Last Year:   . Arboriculturist in the Last Year:   Transportation Needs:   . Film/video editor (Medical):   Marland Kitchen Lack of Transportation (Non-Medical):   Physical Activity:   . Days of Exercise per Week:   . Minutes of Exercise per Session:   Stress:   . Feeling of Stress :   Social Connections:   . Frequency of Communication with Friends and Family:   . Frequency of Social Gatherings with Friends and Family:   . Attends Religious Services:   . Active Member of Clubs or Organizations:   . Attends Archivist Meetings:   Marland Kitchen Marital Status:   Intimate Partner Violence:   . Fear of Current or Ex-Partner:   . Emotionally Abused:   Marland Kitchen Physically Abused:   . Sexually Abused:   Nonsmoker no issues with alcohol use or drug use. 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   FAMILY HISTORY: Notes her mother had ovarian cancer followed by leukemia and died at age 81 years Maternal aunt gastric cancer   ALLERGIES:  is allergic to sulfa antibiotics.  MEDICATIONS:  Current Outpatient Medications  Medication Sig Dispense Refill  . doxycycline (VIBRA-TABS) 100 MG tablet Take 1 tablet (100 mg total) by mouth 2 (two) times daily. 20 tablet 0  . gentamicin cream (GARAMYCIN) 0.1 % Apply 1 application topically 2 (two) times daily. 15 g 1   No current facility-administered medications for this visit.   Facility-Administered Medications Ordered in Other Visits  Medication Dose Route Frequency Provider Last Rate Last Admin  . sodium chloride flush (NS) 0.9 % injection 10 mL  10 mL Intracatheter PRN Brunetta Genera, MD   10 mL at 07/04/17 1540    REVIEW OF SYSTEMS:   A 10+ POINT REVIEW OF SYSTEMS WAS OBTAINED including neurology, dermatology, psychiatry, cardiac, respiratory, lymph, extremities, GI, GU, Musculoskeletal,  constitutional, breasts, reproductive, HEENT.  All pertinent positives are noted in the HPI.  All others are negative.   PHYSICAL EXAMINATION: ECOG PERFORMANCE STATUS: 1 - Symptomatic but completely ambulatory  Vitals:   05/08/20 1535  BP: (!) 180/84  Pulse: 88  Resp: 18  Temp: 98 F (36.7 C)  SpO2: 99%   Filed Weights   05/08/20 1535  Weight: 192 lb (87.1 kg)   .Body mass index is 34.01 kg/m.   GENERAL:alert, in no acute distress and comfortable SKIN: no acute rashes, no significant lesions EYES: conjunctiva are pink and non-injected, sclera anicteric OROPHARYNX: MMM, no exudates, no oropharyngeal erythema or ulceration NECK: supple, no JVD LYMPH:  no palpable lymphadenopathy in the cervical, axillary or inguinal regions LUNGS: clear to auscultation b/l with normal respiratory effort HEART: regular rate & rhythm ABDOMEN:  normoactive bowel sounds , non tender, not distended. No palpable hepatosplenomegaly.  Extremity: no pedal edema PSYCH: alert & oriented x 3 with fluent speech NEURO: no focal motor/sensory deficits  LABORATORY DATA:  I have reviewed the data as listed  . CBC Latest Ref  Rng & Units 05/08/2020 01/06/2020 08/30/2019  WBC 4.0 - 10.5 K/uL 6.1 5.5 5.6  Hemoglobin 12.0 - 15.0 g/dL 12.3 13.1 12.6  Hematocrit 36 - 46 % 35.5(L) 39.0 36.9  Platelets 150 - 400 K/uL 230 222 231   CBC    Component Value Date/Time   WBC 6.1 05/08/2020 1405   RBC 3.95 05/08/2020 1405   HGB 12.3 05/08/2020 1405   HGB 13.4 05/28/2018 1054   HGB 12.6 09/04/2017 1006   HCT 35.5 (L) 05/08/2020 1405   HCT 36.6 09/04/2017 1006   PLT 230 05/08/2020 1405   PLT 248 05/28/2018 1054   PLT 222 09/04/2017 1006   MCV 89.9 05/08/2020 1405   MCV 88.4 09/04/2017 1006   MCH 31.1 05/08/2020 1405   MCHC 34.6 05/08/2020 1405   RDW 11.9 05/08/2020 1405   RDW 13.1 09/04/2017 1006   LYMPHSABS 1.6 05/08/2020 1405   LYMPHSABS 1.1 09/04/2017 1006   MONOABS 0.4 05/08/2020 1405   MONOABS 0.3  09/04/2017 1006   EOSABS 0.2 05/08/2020 1405   EOSABS 0.2 09/04/2017 1006   BASOSABS 0.1 05/08/2020 1405   BASOSABS 0.0 09/04/2017 1006   . CMP Latest Ref Rng & Units 05/08/2020 01/06/2020 08/30/2019  Glucose 70 - 99 mg/dL 146(H) 110(H) 162(H)  BUN 8 - 23 mg/dL 12 11 11   Creatinine 0.44 - 1.00 mg/dL 0.87 0.79 0.82  Sodium 135 - 145 mmol/L 139 141 140  Potassium 3.5 - 5.1 mmol/L 3.9 4.2 4.1  Chloride 98 - 111 mmol/L 105 107 105  CO2 22 - 32 mmol/L 24 24 26   Calcium 8.9 - 10.3 mg/dL 10.0 9.9 9.1  Total Protein 6.5 - 8.1 g/dL 7.4 7.7 7.2  Total Bilirubin 0.3 - 1.2 mg/dL 0.4 0.6 0.4  Alkaline Phos 38 - 126 U/L 73 75 73  AST 15 - 41 U/L 21 24 23   ALT 0 - 44 U/L 18 20 21    . Lab Results  Component Value Date   LDH 154 05/08/2020   Component     Latest Ref Rng & Units 11/12/2016  Hep C Virus Ab     0.0 - 0.9 s/co ratio <0.1  Hepatitis B Surface Ag     Negative Negative  Hep B Core Ab, Tot     Negative Negative       RADIOGRAPHIC STUDIES: I have personally reviewed the radiological images as listed and agreed with the findings in the report. No results found.   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  Presented with Generalized FDG avid lymphadenopathy - predominantly in the abdomen/Retroperitoneum. Also noted to have left supraclavicular and bilateral axillary left more than right FDG avid lymphadenopathy. Based on imaging this would represent at least Stage III disease if this were a lymphoma. LDH level is not significantly elevated. Blood counts are stable. PET/CT scan does show fairly active disease which is FDG avid LNadenopathy and FDG avid splenomegaly. Patient has some constitutional symptoms with about a 10 pound weight loss and some night sweats. Hepatitis profile negative. No other obvious new focal  symptoms.  Patient has had an ECHO which shows nl EF  Patient is status post 6 cycles of R CHOP with no prohibitive toxicities.  PET/CT on 8/72018 - showed complete metabolic response with no metabolically active disease. A few borderline lymph nodes in  the retroperitoneum.  CT chest/abd/pelvis 11/04/2017:  No new or progressive findings to suggest recurrent disease. The small abdominal retroperitoneal lymph nodes are unchanged when comparing to PET-CT of 04/29/2017.   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 Improved with massage therapy  #4 h/o mild shingles  #5 Fatty liver/NASH ? cirrhosis  PLAN:  -Discussed pt labwork today, 05/08/20; blood counts and chemistries are nml, LDH is WNL -Discussed 03/20/2020 MM Breast Bilateral ([id number]) which is "Negative." -Discussed 04/06/2020 [place] Abd RUQ w/Elastography ([id number]) which revealed "ULTRASOUND RUQ: Increased liver echogenicity.  Normal biliary tree ULTRASOUND HEPATIC ELASTOGRAPHY: high probability of being normal." -No lab or clinical evidence of [place] progression at this time.  -No indication to begin treatment. Will continue monitoring with labs and clinical visits.  -Recommend OTC Voltaren for left arm discomfort. -Will see back in 4 months with labs    FOLLOW UP: RTC with Dr Irene Limbo with labs in 4 months   The total time spent in the appt was 20 minutes and more than 50% was on counseling and direct patient cares.  All of the patient's questions were answered with apparent satisfaction. The patient knows to call the clinic with any problems, questions or concerns.   Sullivan Lone MD [place] [facility]MS [facility] Hematology/Oncology Physician [facility]  (Office):       [phone] (Work cell):  [phone] (Fax):           [phone]  I, Yevette Edwards, am acting as a scribe for Dr. Sullivan Lone.   .I have reviewed the above documentation  for accuracy and completeness, and I agree with the above. Brunetta Genera MD

## 2020-05-19 ENCOUNTER — Telehealth: Payer: Self-pay | Admitting: Hematology

## 2020-05-19 NOTE — Telephone Encounter (Signed)
Scheduled per 08/16 los, patient has been called and notified.

## 2020-07-13 DIAGNOSIS — N390 Urinary tract infection, site not specified: Secondary | ICD-10-CM | POA: Diagnosis not present

## 2020-07-14 DIAGNOSIS — Z23 Encounter for immunization: Secondary | ICD-10-CM | POA: Diagnosis not present

## 2020-07-21 IMAGING — XA IR CENTRAL VENOUS CATHETER
2 series · 6 of 6 positions shown · non-contrast
Comparison: none

INDICATION: Non-Hodgkin's lymphoma, poor functioning port catheter, no blood
return

[Series 2: care body 4 · 3 of 10 frames shown]
[frame 2/10]
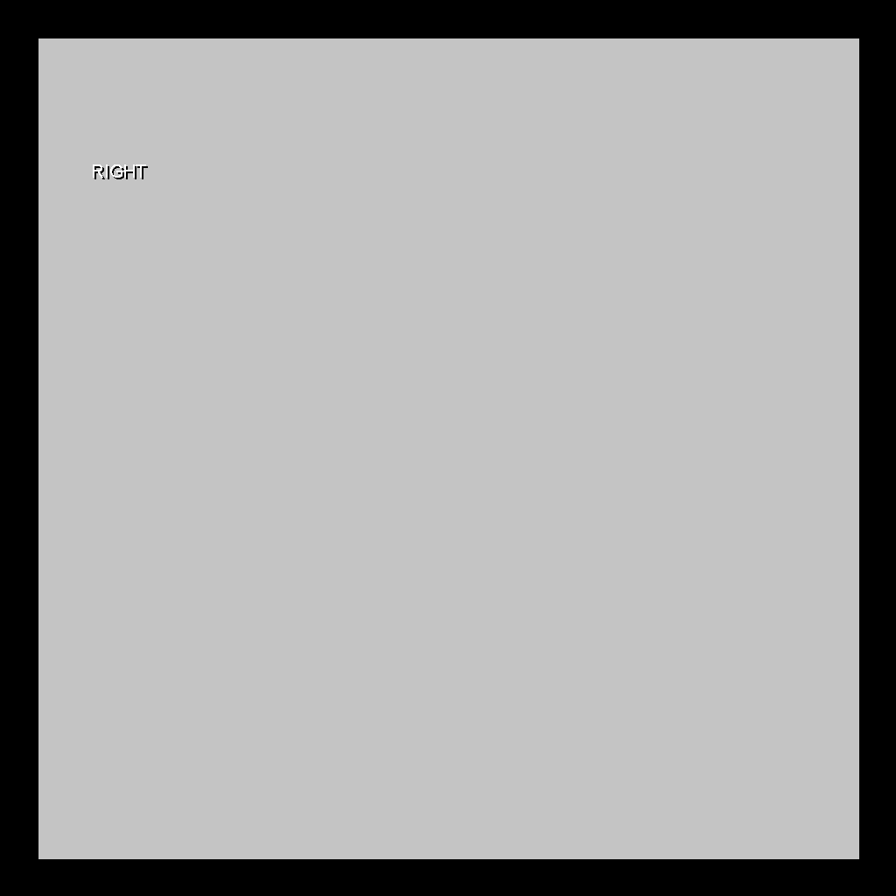
[frame 6/10]
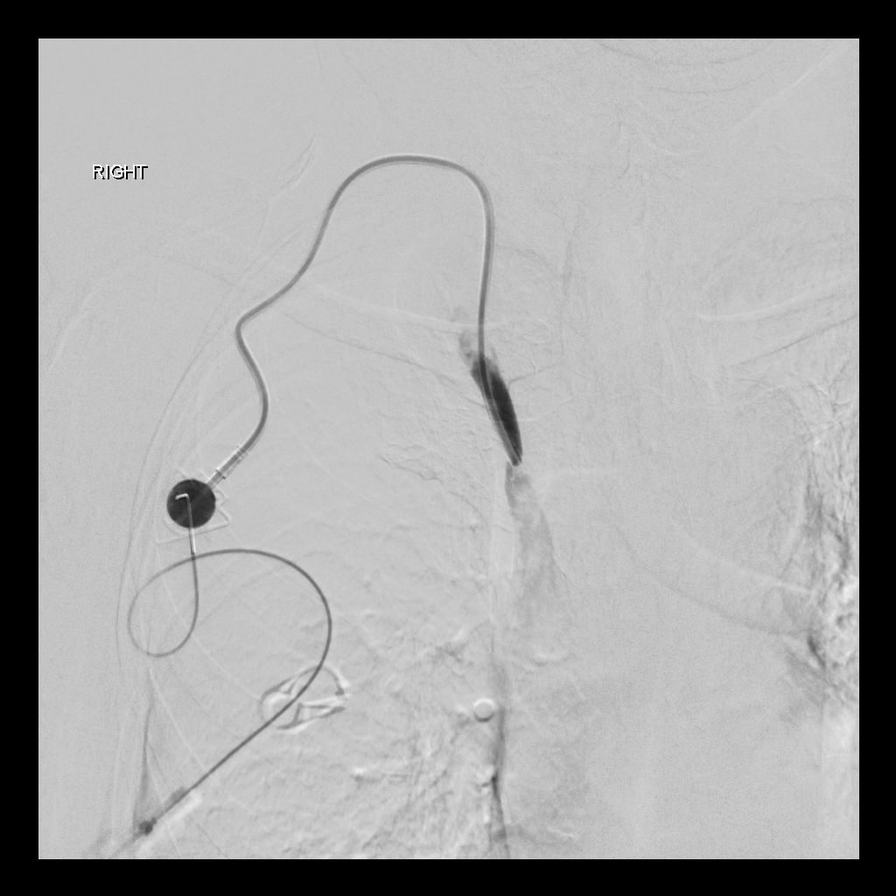
[frame 9/10]
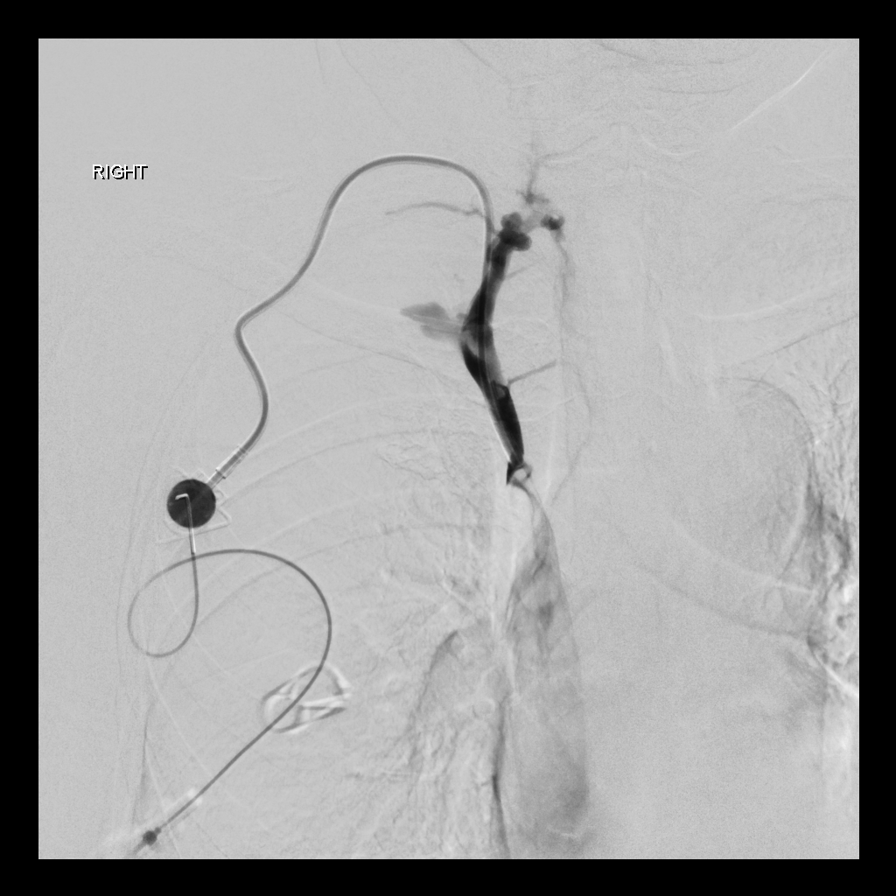

[Series 300: line placements · 3 of 3 slices shown]
[im 1/3]
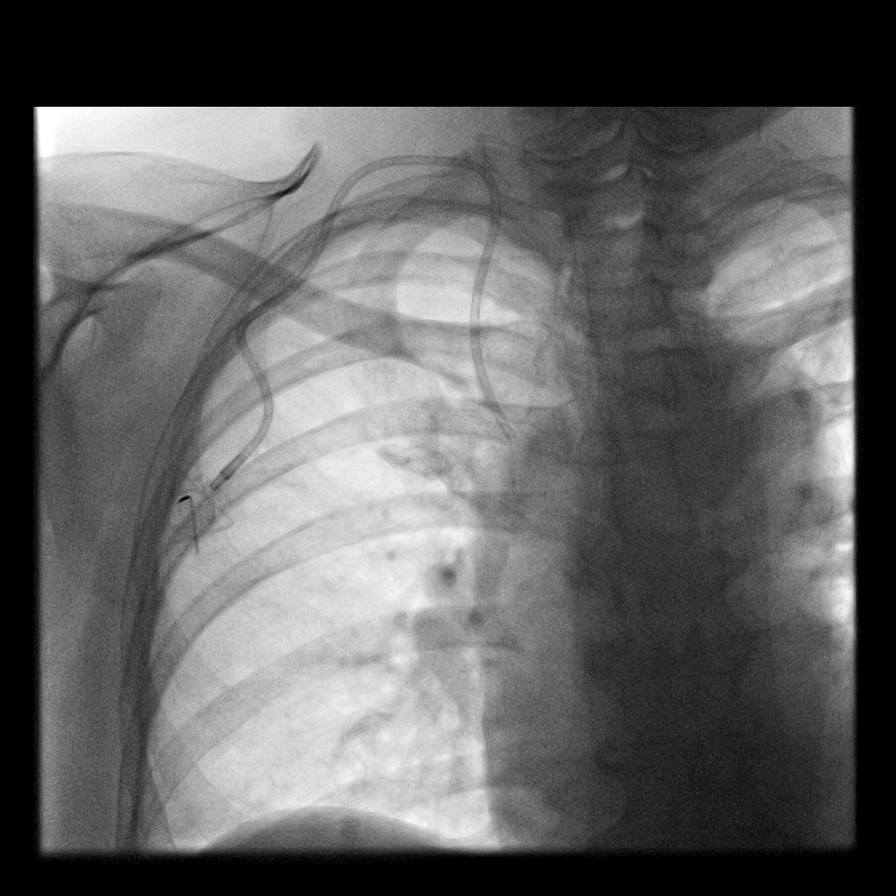
[im 2/3]
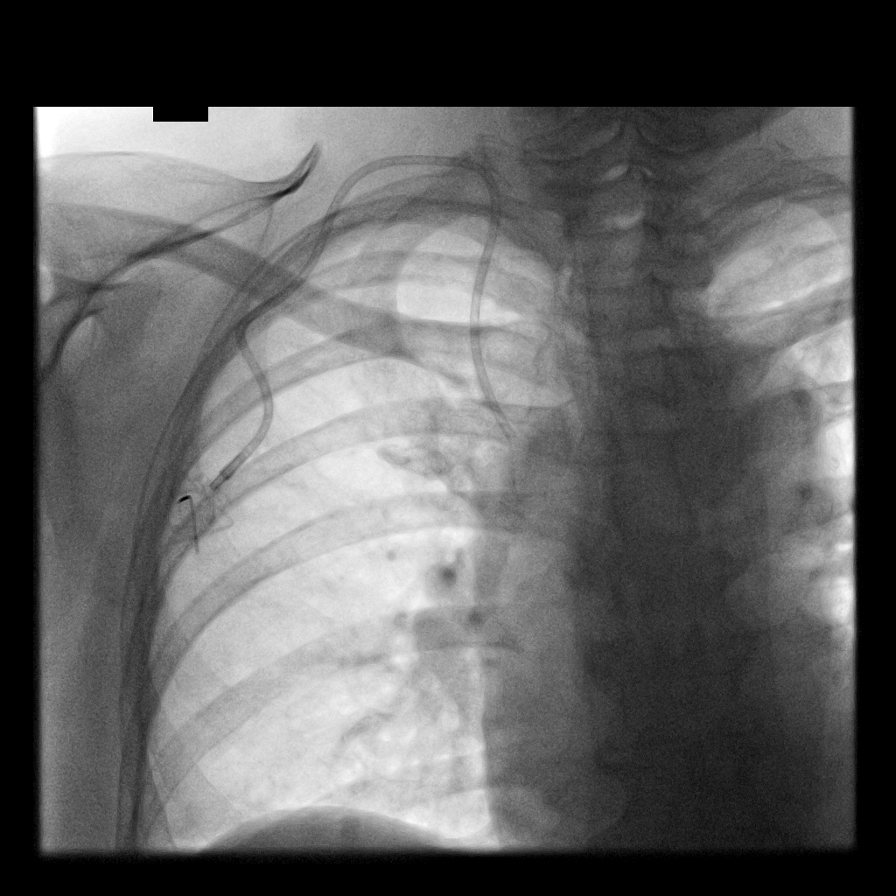
[im 3/3]
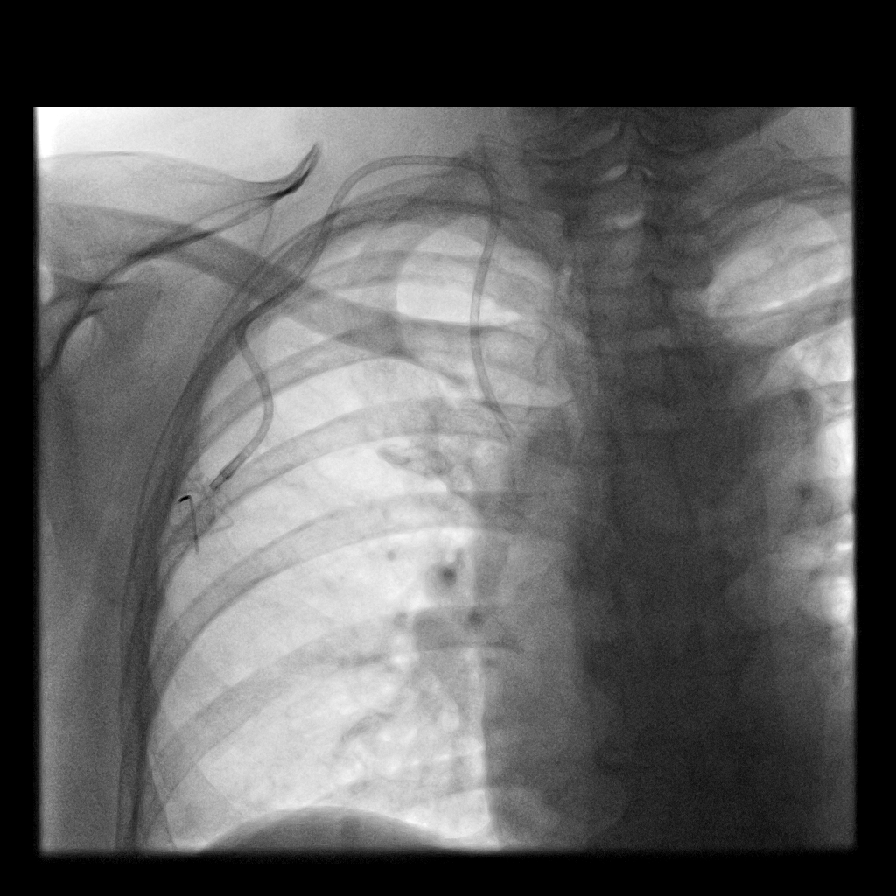

[6 of 6 positions shown; findings below may reference images not displayed]

EXAM:
FLUOROSCOPIC RIGHT PORT CATHETER INJECTION

MEDICATIONS:
NONE.

ANESTHESIA/SEDATION:
Moderate Sedation Time: None. The patient's level of consciousness
and vital signs were monitored continuously by radiology nursing
throughout the procedure under my direct supervision.

FLUOROSCOPY TIME:  Fluoroscopy Time: 0 minutes 6 seconds (6 mGy).

COMPLICATIONS:
None immediate.

PROCEDURE:
Informed written consent was obtained from the patient after a
thorough discussion of the procedural risks, benefits and
alternatives. All questions were addressed. Maximal Sterile Barrier
Technique was utilized including caps, mask, sterile gowns, sterile
gloves, sterile drape, hand hygiene and skin antiseptic. A timeout
was performed prior to the initiation of the procedure.

Under sterile conditions, the existing accessed right IJ port
catheter was injected with contrast. Subtraction imaging performed.
Port catheter is intact and patent. The tubing has retracted into
the right innominate vein just above the innominate venous
confluence. This results in focal stenosis of the left innominate
vein at the catheter tip. There is reflux of contrast into jugular
and mediastinal collateral veins. Below the right innominate
stenosis, the SVC remains patent.
IMPRESSION: Retraction of the right IJ port catheter with the tip now position
in the right innominate vein resulting in a central right innominate
venous stenosis.

Recommend port catheter removal versus revision based on the
remaining chemotherapy schedule. Findings discussed with the
patient.

## 2020-09-04 ENCOUNTER — Inpatient Hospital Stay (HOSPITAL_BASED_OUTPATIENT_CLINIC_OR_DEPARTMENT_OTHER): Payer: Medicare Other | Admitting: Hematology

## 2020-09-04 ENCOUNTER — Inpatient Hospital Stay: Payer: Medicare Other | Attending: Hematology

## 2020-09-04 ENCOUNTER — Other Ambulatory Visit: Payer: Self-pay

## 2020-09-04 VITALS — BP 132/77 | HR 86 | Temp 97.5°F | Resp 18 | Ht 63.0 in | Wt 189.8 lb

## 2020-09-04 DIAGNOSIS — Z23 Encounter for immunization: Secondary | ICD-10-CM | POA: Insufficient documentation

## 2020-09-04 DIAGNOSIS — C8248 Follicular lymphoma grade IIIb, lymph nodes of multiple sites: Secondary | ICD-10-CM | POA: Insufficient documentation

## 2020-09-04 LAB — CBC WITH DIFFERENTIAL/PLATELET
Abs Immature Granulocytes: 0.01 10*3/uL (ref 0.00–0.07)
Basophils Absolute: 0 10*3/uL (ref 0.0–0.1)
Basophils Relative: 1 %
Eosinophils Absolute: 0.2 10*3/uL (ref 0.0–0.5)
Eosinophils Relative: 3 %
HCT: 35.2 % — ABNORMAL LOW (ref 36.0–46.0)
Hemoglobin: 12 g/dL (ref 12.0–15.0)
Immature Granulocytes: 0 %
Lymphocytes Relative: 25 %
Lymphs Abs: 1.4 10*3/uL (ref 0.7–4.0)
MCH: 30.4 pg (ref 26.0–34.0)
MCHC: 34.1 g/dL (ref 30.0–36.0)
MCV: 89.1 fL (ref 80.0–100.0)
Monocytes Absolute: 0.3 10*3/uL (ref 0.1–1.0)
Monocytes Relative: 6 %
Neutro Abs: 3.7 10*3/uL (ref 1.7–7.7)
Neutrophils Relative %: 65 %
Platelets: 212 10*3/uL (ref 150–400)
RBC: 3.95 MIL/uL (ref 3.87–5.11)
RDW: 12.2 % (ref 11.5–15.5)
WBC: 5.6 10*3/uL (ref 4.0–10.5)
nRBC: 0 % (ref 0.0–0.2)

## 2020-09-04 LAB — CMP (CANCER CENTER ONLY)
ALT: 17 U/L (ref 0–44)
AST: 21 U/L (ref 15–41)
Albumin: 4 g/dL (ref 3.5–5.0)
Alkaline Phosphatase: 62 U/L (ref 38–126)
Anion gap: 10 (ref 5–15)
BUN: 12 mg/dL (ref 8–23)
CO2: 25 mmol/L (ref 22–32)
Calcium: 9.5 mg/dL (ref 8.9–10.3)
Chloride: 105 mmol/L (ref 98–111)
Creatinine: 0.92 mg/dL (ref 0.44–1.00)
GFR, Estimated: >60 mL/min (ref >60)
Glucose, Bld: 183 mg/dL — ABNORMAL HIGH (ref 70–99)
Potassium: 4 mmol/L (ref 3.5–5.1)
Sodium: 140 mmol/L (ref 135–145)
Total Bilirubin: 0.5 mg/dL (ref 0.3–1.2)
Total Protein: 7 g/dL (ref 6.5–8.1)

## 2020-09-04 LAB — LACTATE DEHYDROGENASE: LDH: 139 U/L (ref 98–192)

## 2020-09-04 MED ORDER — INFLUENZA VAC A&B SA ADJ QUAD 0.5 ML IM PRSY
0.5000 mL | PREFILLED_SYRINGE | Freq: Once | INTRAMUSCULAR | Status: AC
  Administered 2020-09-04: 0.5 mL via INTRAMUSCULAR

## 2020-09-04 MED ORDER — INFLUENZA VAC A&B SA ADJ QUAD 0.5 ML IM PRSY
PREFILLED_SYRINGE | INTRAMUSCULAR | Status: AC
  Filled 2020-09-04: qty 0.5

## 2020-09-04 NOTE — Progress Notes (Signed)
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 female who has been referred to [place] by Dr .Katelyn Noble, MD for evaluation and management of suspected non-Hodgkin's lymphoma.  Patient notes that she presented with back pain for 4-6 weeks and eventually had pain radiating to her right inguinal area. She notes that she has had interstitial cystitis in the past. She was thought to have a urinary tract infection and was treated with Macrodantin. Says the pain got worse and started radiating into the right groin she had a CT of the abdomen renal stone protocol on 10/25/2016 which showed extensive retroperitoneal lymphadenopathy concerning for lymphoproliferative disorder. She was incidentally noted to have a nodular border of the liver with possible liver cirrhosis. No urinary stones noted.  She subsequently had a PET CT scan ordered by her primary care physician which was done on 11/07/2016. This showed diffuse hypermetabolic spleen with mild splenomegaly. Hypermetabolic adenopathy in the left supraclavicular, bilateral axillary, peripancreatic, gastrohepatic ligament, celiac, porta hepatis, periaortic, and left external iliac chains. Hypermetabolic mesenteric lymph nodes. Appearance favors lymphoma.  Patient notes some night sweats for 2 weeks. Has lost about 10 pounds in the last month. No acute fevers or chills. Notes the back pain is controlled with when necessary Vicodin.  INTERVAL HISTORY: Katelyn Reeves is here for her scheduled follow-up regarding her follicular lymphoma. The patient's last visit with [place] was on 05/08/2020. The pt reports that she is doing well overall.  The pt reports that she had a UTI in October. She  has received the COVID19 booster but has not gottend her annual flu vaccine yet. Pt endorses new back pain that is worse at night. She has lost a few pounds through dieting.   Lab results today (09/04/20) of CBC w/diff and CMP is as follows: all values are WNL except for HCT at 35.2, Glucose 183. 09/04/2020 LDH at 139  On review of systems, pt reports back pain, nocturia and denies fevers, chills, night sweats, unexpected weight loss, abdominal pain and any other symptoms.    MEDICAL HISTORY:  #1 fibrocystic disease of the breast #2 hypertension #3 constipation #4 duplicated right sided ureters #5 imaging concern for possible liver cirrhosis   SURGICAL HISTORY: #1 total abdominal hysterectomy with bilateral salpingo-oophorectomy for ovarian cyst. #2 Lipoma removal left upper back #3 hemorrhoidectomy  SOCIAL HISTORY: Social History   Socioeconomic History   Marital status: Divorced    Spouse name: Not on file   Number of children: Not on file   Years of education: Not on file   Highest education level: Not on file  Occupational History   Not on file  Tobacco Use   Smoking status: Never Smoker   Smokeless tobacco: Never Used  Vaping Use   Vaping Use: Never used  Substance and Sexual Activity   Alcohol use: No   Drug use: No   Sexual activity: Not on file  Other Topics Concern   Not on file  Social History Narrative   Not on file   Social Determinants of Health   Financial Resource Strain: Not on file  Food Insecurity: Not on file  Transportation Needs: Not on file  Physical Activity: Not on file  Stress:  Not on file  Social Connections: Not on file  Intimate Partner Violence: Not on file  Nonsmoker no issues with alcohol use or drug use. 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   FAMILY HISTORY: Notes her mother had ovarian cancer followed by leukemia and died at age 77 years Maternal  aunt gastric cancer   ALLERGIES:  is allergic to sulfa antibiotics.  MEDICATIONS:  Current Outpatient Medications  Medication Sig Dispense Refill   doxycycline (VIBRA-TABS) 100 MG tablet Take 1 tablet (100 mg total) by mouth 2 (two) times daily. 20 tablet 0   gentamicin cream (GARAMYCIN) 0.1 % Apply 1 application topically 2 (two) times daily. 15 g 1   Current Facility-Administered Medications  Medication Dose Route Frequency Provider Last Rate Last Admin   influenza vaccine adjuvanted (FLUAD) injection 0.5 mL  0.5 mL Intramuscular Once Katelyn Genera, MD       Facility-Administered Medications Ordered in Other Visits  Medication Dose Route Frequency Provider Last Rate Last Admin   sodium chloride flush (NS) 0.9 % injection 10 mL  10 mL Intracatheter PRN Katelyn Genera, MD   10 mL at 07/04/17 1540    REVIEW OF SYSTEMS:   A 10+ POINT REVIEW OF SYSTEMS WAS OBTAINED including neurology, dermatology, psychiatry, cardiac, respiratory, lymph, extremities, GI, GU, Musculoskeletal, constitutional, breasts, reproductive, HEENT.  All pertinent positives are noted in the HPI.  All others are negative.   PHYSICAL EXAMINATION: ECOG PERFORMANCE STATUS: 1 - Symptomatic but completely ambulatory  Vitals:   09/04/20 1419  BP: 132/77  Pulse: 86  Resp: 18  Temp: (!) 97.5 F (36.4 C)  SpO2: 99%   Filed Weights   09/04/20 1419  Weight: 189 lb 12.8 oz (86.1 kg)   .Body mass index is 33.62 kg/m.   GENERAL:alert, in no acute distress and comfortable SKIN: no acute rashes, no significant lesions EYES: conjunctiva are pink and non-injected, sclera anicteric OROPHARYNX: MMM, no exudates, no oropharyngeal erythema or ulceration NECK: supple, no JVD LYMPH:  no palpable lymphadenopathy in the cervical, axillary or inguinal regions LUNGS: clear to auscultation b/l with normal respiratory effort HEART: regular rate & rhythm ABDOMEN:  normoactive bowel sounds , non tender, not  distended. No palpable hepatosplenomegaly.  Extremity: no pedal edema PSYCH: alert & oriented x 3 with fluent speech NEURO: no focal motor/sensory deficits  LABORATORY DATA:  I have reviewed the data as listed  . CBC Latest Ref Rng & Units 09/04/2020 05/08/2020 01/06/2020  WBC 4.0 - 10.5 K/uL 5.6 6.1 5.5  Hemoglobin 12.0 - 15.0 g/dL 12.0 12.3 13.1  Hematocrit 36.0 - 46.0 % 35.2(L) 35.5(L) 39.0  Platelets 150 - 400 K/uL 212 230 222   CBC    Component Value Date/Time   WBC 5.6 09/04/2020 1350   RBC 3.95 09/04/2020 1350   HGB 12.0 09/04/2020 1350   HGB 13.4 05/28/2018 1054   HGB 12.6 09/04/2017 1006   HCT 35.2 (L) 09/04/2020 1350   HCT 36.6 09/04/2017 1006   PLT 212 09/04/2020 1350   PLT 248 05/28/2018 1054   PLT 222 09/04/2017 1006   MCV 89.1 09/04/2020 1350   MCV 88.4 09/04/2017 1006   MCH 30.4 09/04/2020 1350   MCHC 34.1 09/04/2020 1350   RDW 12.2 09/04/2020 1350   RDW 13.1 09/04/2017 1006   LYMPHSABS 1.4 09/04/2020 1350   LYMPHSABS 1.1 09/04/2017 1006   MONOABS 0.3 09/04/2020 1350   MONOABS 0.3 09/04/2017 1006   EOSABS  0.2 09/04/2020 1350   EOSABS 0.2 09/04/2017 1006   BASOSABS 0.0 09/04/2020 1350   BASOSABS 0.0 09/04/2017 1006   . CMP Latest Ref Rng & Units 09/04/2020 05/08/2020 01/06/2020  Glucose 70 - 99 mg/dL 183(H) 146(H) 110(H)  BUN 8 - 23 mg/dL 12 12 11   Creatinine 0.44 - 1.00 mg/dL 0.92 0.87 0.79  Sodium 135 - 145 mmol/L 140 139 141  Potassium 3.5 - 5.1 mmol/L 4.0 3.9 4.2  Chloride 98 - 111 mmol/L 105 105 107  CO2 22 - 32 mmol/L 25 24 24   Calcium 8.9 - 10.3 mg/dL 9.5 10.0 9.9  Total Protein 6.5 - 8.1 g/dL 7.0 7.4 7.7  Total Bilirubin 0.3 - 1.2 mg/dL 0.5 0.4 0.6  Alkaline Phos 38 - 126 U/L 62 73 75  AST 15 - 41 U/L 21 21 24   ALT 0 - 44 U/L 17 18 20    . Lab Results  Component Value Date   LDH 139 09/04/2020   Component     Latest Ref Rng & Units 11/12/2016  Hep C Virus Ab     0.0 - 0.9 s/co ratio <0.1  Hepatitis B Surface Ag     Negative Negative   Hep B Core Ab, Tot     Negative Negative       RADIOGRAPHIC STUDIES: I have personally reviewed the radiological images as listed and agreed with the findings in the report. No results found.   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  Presented with Generalized FDG avid lymphadenopathy - predominantly in the abdomen/Retroperitoneum. Also noted to have left supraclavicular and bilateral axillary left more than right FDG avid lymphadenopathy. Based on imaging this would represent at least Stage III disease if this were a lymphoma. LDH level is not significantly elevated. Blood counts are stable. PET/CT scan does show fairly active disease which is FDG avid LNadenopathy and FDG avid splenomegaly. Patient has some constitutional symptoms with about a 10 pound weight loss and some night sweats. Hepatitis profile negative. No other obvious new focal symptoms.  Patient has had an ECHO which shows nl EF  Patient is status post 6 cycles of R CHOP with no prohibitive toxicities.  PET/CT on 8/72018 - showed complete metabolic response with no metabolically active disease. A few borderline lymph nodes in the retroperitoneum.  CT chest/abd/pelvis 11/04/2017:  No new or progressive findings to suggest recurrent disease. The small abdominal retroperitoneal lymph nodes are unchanged when comparing to PET-CT of 04/29/2017.   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 Improved with massage therapy  #4 h/o mild shingles  #5 Fatty liver/NASH ? cirrhosis  PLAN:  -Discussed pt labwork today, 09/04/20; blood counts and chemistries look good, LDH is WNL.  -No lab or clinical evidence of FL progression at this time.  -No indication to begin treatment. Will continue  monitoring with labs & clinic visits. -Advised pt that back pain could be from previous UTI and if so, should continue to resolve.  -Recommend pt f/u with PCP if back pain is persistent and bothersome. -Will give annual flu vaccine in clinic today.  -Will see back in 6 months with labs   FOLLOW UP: RTC with Dr Irene Limbo with labs in 6 months Flu shot today   The total  time spent in the appt was 20 minutes and more than 50% was on counseling and direct patient cares.  All of the patient's questions were answered with apparent satisfaction. The patient knows to call the clinic with any problems, questions or concerns.   Sullivan Lone MD [place] AAHIVMS [facility] Hematology/Oncology Physician [facility]  (Office):       [phone] (Work cell):  [phone] (Fax):           [phone]  I, Yevette Edwards, am acting as a scribe for Dr. Sullivan Lone.   .I have reviewed the above documentation for accuracy and completeness, and I agree with the above. Katelyn Genera MD

## 2021-01-17 ENCOUNTER — Telehealth: Payer: Self-pay | Admitting: Hematology

## 2021-01-17 NOTE — Telephone Encounter (Signed)
R/S per 12/14 los, Patient aware

## 2021-02-15 DIAGNOSIS — Z23 Encounter for immunization: Secondary | ICD-10-CM | POA: Diagnosis not present

## 2021-03-05 ENCOUNTER — Other Ambulatory Visit: Payer: PRIVATE HEALTH INSURANCE

## 2021-03-05 ENCOUNTER — Ambulatory Visit: Payer: PRIVATE HEALTH INSURANCE | Admitting: Hematology

## 2021-03-19 ENCOUNTER — Telehealth: Payer: Self-pay | Admitting: Hematology

## 2021-03-19 NOTE — Telephone Encounter (Signed)
Rescheduled upcoming appointment due to provider out of office. Patient is aware of changes.

## 2021-03-20 ENCOUNTER — Ambulatory Visit: Payer: PRIVATE HEALTH INSURANCE | Admitting: Hematology

## 2021-03-20 ENCOUNTER — Other Ambulatory Visit: Payer: PRIVATE HEALTH INSURANCE

## 2021-03-23 ENCOUNTER — Encounter: Payer: Self-pay | Admitting: Hematology

## 2021-03-28 ENCOUNTER — Other Ambulatory Visit (HOSPITAL_COMMUNITY): Payer: Self-pay | Admitting: Internal Medicine

## 2021-03-28 DIAGNOSIS — Z1231 Encounter for screening mammogram for malignant neoplasm of breast: Secondary | ICD-10-CM

## 2021-03-28 NOTE — Progress Notes (Signed)
HEMATOLOGY/ONCOLOGY CLINIC NOTE  Date of Service: 03/29/21    Patient Care Team: Asencion Noble, MD as PCP - General (Internal Medicine)   CHIEF COMPLAINTS/PURPOSE OF CONSULTATION:  F/u for Follicular lymphoma prior to next cycle of Maintenance Rituxan   HISTORY OF PRESENTING ILLNESS:  Katelyn Reeves is a wonderful 70 y.o. female who has been referred to [place] by Dr .Asencion Noble, MD for evaluation and management of suspected non-Hodgkin's lymphoma.  Patient notes that she presented with back pain for 4-6 weeks and eventually had pain radiating to her right inguinal area. She notes that she has had interstitial cystitis in the past. She was thought to have a urinary tract infection and was treated with Macrodantin. Says the pain got worse and started radiating into the right groin she had a CT of the abdomen renal stone protocol on 10/25/2016 which showed extensive retroperitoneal lymphadenopathy concerning for lymphoproliferative disorder. She was incidentally noted to have a nodular border of the liver with possible liver cirrhosis. No urinary stones noted.  She subsequently had a PET CT scan ordered by her primary care physician which was done on 11/07/2016. This showed diffuse hypermetabolic spleen with mild splenomegaly. Hypermetabolic adenopathy in the left supraclavicular, bilateral axillary, peripancreatic, gastrohepatic ligament, celiac, porta hepatis, periaortic, and left external iliac chains. Hypermetabolic mesenteric lymph nodes. Appearance favors lymphoma.  Patient notes some night sweats for 2 weeks. Has lost about 10 pounds in the last month. No acute fevers or chills. Notes the back pain is controlled with when necessary Vicodin.  INTERVAL HISTORY: Katelyn Reeves is here for her scheduled follow-up regarding her follicular lymphoma. The patient's last visit with [place] was on 09/05/2020. The pt reports that she is doing well overall.  The pt reports no new symptoms or concerns. She is  not currently on any medications. She has received her second booster for covid 19.  Lab results today 03/29/2021 of CBC w/diff and CMP is as follows: all values are WNL except for Glucose of 114. 03/29/2021 LDH  Lab Results  Component Value Date   LDH 173 03/29/2021   On review of systems, pt reports weight gain and denies fevers, chills, night sweats, new lumps/bumps, abdominal pain, back pain, leg swelling, change in bowel habits, and any other symptoms.  MEDICAL HISTORY:  #1 fibrocystic disease of the breast #2 hypertension #3 constipation #4 duplicated right sided ureters #5 imaging concern for possible liver cirrhosis   SURGICAL HISTORY: #1 total abdominal hysterectomy with bilateral salpingo-oophorectomy for ovarian cyst. #2 Lipoma removal left upper back #3 hemorrhoidectomy  SOCIAL HISTORY: Social History   Socioeconomic History   Marital status: Divorced    Spouse name: Not on file   Number of children: Not on file   Years of education: Not on file   Highest education level: Not on file  Occupational History   Not on file  Tobacco Use   Smoking status: Never   Smokeless tobacco: Never  Vaping Use   Vaping Use: Never used  Substance and Sexual Activity   Alcohol use: No   Drug use: No   Sexual activity: Not on file  Other Topics Concern   Not on file  Social History Narrative   Not on file   Social Determinants of Health   Financial Resource Strain: Not on file  Food Insecurity: Not on file  Transportation Needs: Not on file  Physical Activity: Not on file  Stress: Not on file  Social Connections: Not on file  Intimate Partner Violence: Not on file  Nonsmoker no issues with alcohol use or drug use. 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   FAMILY HISTORY: Notes her mother had ovarian cancer followed by leukemia and died at age 75 years Maternal aunt gastric cancer   ALLERGIES:  is  allergic to sulfa antibiotics.  MEDICATIONS:  No current outpatient medications on file.   No current facility-administered medications for this visit.    REVIEW OF SYSTEMS:   10 Point review of Systems was done is negative except as noted above.  PHYSICAL EXAMINATION: ECOG PERFORMANCE STATUS: 1 - Symptomatic but completely ambulatory  Vitals:   03/29/21 1134  BP: (!) 191/97  Pulse: 79  Resp: 18  Temp: 97.9 F (36.6 C)  SpO2: 97%    Filed Weights   03/29/21 1134  Weight: 195 lb (88.5 kg)    .Body mass index is 34.54 kg/m.   NAD. GENERAL:alert, in no acute distress and comfortable SKIN: no acute rashes, no significant lesions EYES: conjunctiva are pink and non-injected, sclera anicteric OROPHARYNX: MMM, no exudates, no oropharyngeal erythema or ulceration NECK: supple, no JVD LYMPH:  no palpable lymphadenopathy in the cervical, axillary or inguinal regions LUNGS: clear to auscultation b/l with normal respiratory effort HEART: regular rate & rhythm ABDOMEN:  normoactive bowel sounds , non tender, not distended. No palpable hepatosplenomegaly.  Extremity: no pedal edema PSYCH: alert & oriented x 3 with fluent speech NEURO: no focal motor/sensory deficits  LABORATORY DATA:  I have reviewed the data as listed  . CBC Latest Ref Rng & Units 03/29/2021 09/04/2020 05/08/2020  WBC 4.0 - 10.5 K/uL 5.6 5.6 6.1  Hemoglobin 12.0 - 15.0 g/dL 12.4 12.0 12.3  Hematocrit 36.0 - 46.0 % 36.2 35.2(L) 35.5(L)  Platelets 150 - 400 K/uL 230 212 230   CBC    Component Value Date/Time   WBC 5.6 03/29/2021 1117   RBC 4.04 03/29/2021 1117   HGB 12.4 03/29/2021 1117   HGB 13.4 05/28/2018 1054   HGB 12.6 09/04/2017 1006   HCT 36.2 03/29/2021 1117   HCT 36.6 09/04/2017 1006   PLT 230 03/29/2021 1117   PLT 248 05/28/2018 1054   PLT 222 09/04/2017 1006   MCV 89.6 03/29/2021 1117   MCV 88.4 09/04/2017 1006   MCH 30.7 03/29/2021 1117   MCHC 34.3 03/29/2021 1117   RDW 12.4  03/29/2021 1117   RDW 13.1 09/04/2017 1006   LYMPHSABS 1.6 03/29/2021 1117   LYMPHSABS 1.1 09/04/2017 1006   MONOABS 0.4 03/29/2021 1117   MONOABS 0.3 09/04/2017 1006   EOSABS 0.2 03/29/2021 1117   EOSABS 0.2 09/04/2017 1006   BASOSABS 0.1 03/29/2021 1117   BASOSABS 0.0 09/04/2017 1006   . CMP Latest Ref Rng & Units 03/29/2021 09/04/2020 05/08/2020  Glucose 70 - 99 mg/dL 114(H) 183(H) 146(H)  BUN 8 - 23 mg/dL 14 12 12   Creatinine 0.44 - 1.00 mg/dL 0.84 0.92 0.87  Sodium 135 - 145 mmol/L 141 140 139  Potassium 3.5 - 5.1 mmol/L 4.3 4.0 3.9  Chloride 98 - 111 mmol/L 107 105 105  CO2 22 - 32 mmol/L 24 25 24   Calcium 8.9 - 10.3 mg/dL 9.4 9.5 10.0  Total Protein 6.5 - 8.1 g/dL 7.5 7.0 7.4  Total Bilirubin 0.3 - 1.2 mg/dL 0.5 0.5 0.4  Alkaline Phos 38 - 126 U/L 74 62 73  AST 15 - 41 U/L 26 21 21   ALT 0 - 44 U/L  23 17 18    . Lab Results  Component Value Date   LDH 173 03/29/2021   Component     Latest Ref Rng & Units 11/12/2016  Hep C Virus Ab     0.0 - 0.9 s/co ratio <0.1  Hepatitis B Surface Ag     Negative Negative  Hep B Core Ab, Tot     Negative Negative       RADIOGRAPHIC STUDIES: I have personally reviewed the radiological images as listed and agreed with the findings in the report. No results found.   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  Presented with Generalized FDG avid lymphadenopathy - predominantly in the abdomen/Retroperitoneum. Also noted to have left supraclavicular and bilateral axillary left more than right FDG avid lymphadenopathy. Based on imaging this would represent at least Stage III disease if this were a lymphoma. LDH level is not significantly elevated. Blood counts are stable. PET/CT scan does show fairly active disease which is FDG avid LNadenopathy and FDG avid  splenomegaly. Patient has some constitutional symptoms with about a 10 pound weight loss and some night sweats. Hepatitis profile negative. No other obvious new focal symptoms.  Patient has had an ECHO which shows nl EF  Patient is status post 6 cycles of R CHOP with no prohibitive toxicities.  PET/CT on 8/72018 - showed complete metabolic response with no metabolically active disease. A few borderline lymph nodes in the retroperitoneum.  CT chest/abd/pelvis 11/04/2017:  No new or progressive findings to suggest recurrent disease. The small abdominal retroperitoneal lymph nodes are unchanged when comparing to PET-CT of 04/29/2017.   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 Improved with massage therapy  #4 h/o mild shingles  #5 Fatty liver/NASH ? cirrhosis  PLAN:  -Discussed pt labwork today, 03/29/2021; blood counts completely normal, chemistries normal. -Recommended pt receive the Shingrix vaccine. -Advised pt she is nearly four years out from treatment completion and in remission (August 2018). -No lab or clinical evidence of FL progression at this time.  -No indication to begin treatment. Will continue monitoring with labs & clinic visits. -Will see back in 6 months with labs.   FOLLOW UP: RTC with Dr Irene Limbo with labs in 6 months   The total time spent in the appt was 20 minutes and more than 50% was on counseling and direct patient cares.  All of the patient's questions were answered with apparent satisfaction. The patient knows to call the clinic with any problems, questions or concerns.   Sullivan Lone MD [place] AAHIVMS [facility] Hematology/Oncology Physician [facility]  (Office):       [phone] (Work cell):  [phone] (Fax):           [phone]  I, Reinaldo Raddle, am acting as scribe for Dr. Sullivan Lone, MD.    .I have reviewed the above documentation for accuracy and  completeness, and I agree with the above. Brunetta Genera MD

## 2021-03-29 ENCOUNTER — Other Ambulatory Visit: Payer: Self-pay

## 2021-03-29 ENCOUNTER — Inpatient Hospital Stay: Payer: Medicare Other | Attending: Hematology

## 2021-03-29 ENCOUNTER — Inpatient Hospital Stay (HOSPITAL_BASED_OUTPATIENT_CLINIC_OR_DEPARTMENT_OTHER): Payer: Medicare Other | Admitting: Hematology

## 2021-03-29 VITALS — BP 191/97 | HR 79 | Temp 97.9°F | Resp 18 | Wt 195.0 lb

## 2021-03-29 DIAGNOSIS — Z8041 Family history of malignant neoplasm of ovary: Secondary | ICD-10-CM | POA: Diagnosis not present

## 2021-03-29 DIAGNOSIS — Z806 Family history of leukemia: Secondary | ICD-10-CM | POA: Diagnosis not present

## 2021-03-29 DIAGNOSIS — M549 Dorsalgia, unspecified: Secondary | ICD-10-CM | POA: Diagnosis not present

## 2021-03-29 DIAGNOSIS — M25511 Pain in right shoulder: Secondary | ICD-10-CM | POA: Insufficient documentation

## 2021-03-29 DIAGNOSIS — Z90722 Acquired absence of ovaries, bilateral: Secondary | ICD-10-CM | POA: Insufficient documentation

## 2021-03-29 DIAGNOSIS — Z23 Encounter for immunization: Secondary | ICD-10-CM

## 2021-03-29 DIAGNOSIS — Z90710 Acquired absence of both cervix and uterus: Secondary | ICD-10-CM | POA: Insufficient documentation

## 2021-03-29 DIAGNOSIS — K7581 Nonalcoholic steatohepatitis (NASH): Secondary | ICD-10-CM | POA: Insufficient documentation

## 2021-03-29 DIAGNOSIS — C8248 Follicular lymphoma grade IIIb, lymph nodes of multiple sites: Secondary | ICD-10-CM | POA: Insufficient documentation

## 2021-03-29 DIAGNOSIS — Z9079 Acquired absence of other genital organ(s): Secondary | ICD-10-CM | POA: Insufficient documentation

## 2021-03-29 DIAGNOSIS — R61 Generalized hyperhidrosis: Secondary | ICD-10-CM | POA: Insufficient documentation

## 2021-03-29 DIAGNOSIS — Z800 Family history of malignant neoplasm of digestive organs: Secondary | ICD-10-CM | POA: Diagnosis not present

## 2021-03-29 LAB — CBC WITH DIFFERENTIAL/PLATELET
Abs Immature Granulocytes: 0.01 10*3/uL (ref 0.00–0.07)
Basophils Absolute: 0.1 10*3/uL (ref 0.0–0.1)
Basophils Relative: 1 %
Eosinophils Absolute: 0.2 10*3/uL (ref 0.0–0.5)
Eosinophils Relative: 4 %
HCT: 36.2 % (ref 36.0–46.0)
Hemoglobin: 12.4 g/dL (ref 12.0–15.0)
Immature Granulocytes: 0 %
Lymphocytes Relative: 28 %
Lymphs Abs: 1.6 10*3/uL (ref 0.7–4.0)
MCH: 30.7 pg (ref 26.0–34.0)
MCHC: 34.3 g/dL (ref 30.0–36.0)
MCV: 89.6 fL (ref 80.0–100.0)
Monocytes Absolute: 0.4 10*3/uL (ref 0.1–1.0)
Monocytes Relative: 6 %
Neutro Abs: 3.4 10*3/uL (ref 1.7–7.7)
Neutrophils Relative %: 61 %
Platelets: 230 10*3/uL (ref 150–400)
RBC: 4.04 MIL/uL (ref 3.87–5.11)
RDW: 12.4 % (ref 11.5–15.5)
WBC: 5.6 10*3/uL (ref 4.0–10.5)
nRBC: 0 % (ref 0.0–0.2)

## 2021-03-29 LAB — CMP (CANCER CENTER ONLY)
ALT: 23 U/L (ref 0–44)
AST: 26 U/L (ref 15–41)
Albumin: 4.2 g/dL (ref 3.5–5.0)
Alkaline Phosphatase: 74 U/L (ref 38–126)
Anion gap: 10 (ref 5–15)
BUN: 14 mg/dL (ref 8–23)
CO2: 24 mmol/L (ref 22–32)
Calcium: 9.4 mg/dL (ref 8.9–10.3)
Chloride: 107 mmol/L (ref 98–111)
Creatinine: 0.84 mg/dL (ref 0.44–1.00)
GFR, Estimated: >60 mL/min (ref >60)
Glucose, Bld: 114 mg/dL — ABNORMAL HIGH (ref 70–99)
Potassium: 4.3 mmol/L (ref 3.5–5.1)
Sodium: 141 mmol/L (ref 135–145)
Total Bilirubin: 0.5 mg/dL (ref 0.3–1.2)
Total Protein: 7.5 g/dL (ref 6.5–8.1)

## 2021-03-29 LAB — LACTATE DEHYDROGENASE: LDH: 173 U/L (ref 98–192)

## 2021-04-04 ENCOUNTER — Encounter: Payer: Self-pay | Admitting: Hematology

## 2021-04-05 ENCOUNTER — Ambulatory Visit (HOSPITAL_COMMUNITY)
Admission: RE | Admit: 2021-04-05 | Discharge: 2021-04-05 | Disposition: A | Discharge status: Home / Self Care | Payer: Medicare Other | Source: Ambulatory Visit | Attending: Internal Medicine | Admitting: Internal Medicine

## 2021-04-05 ENCOUNTER — Other Ambulatory Visit: Payer: Self-pay

## 2021-04-05 DIAGNOSIS — Z1231 Encounter for screening mammogram for malignant neoplasm of breast: Secondary | ICD-10-CM

## 2021-07-21 DIAGNOSIS — Z23 Encounter for immunization: Secondary | ICD-10-CM | POA: Diagnosis not present

## 2021-09-26 ENCOUNTER — Other Ambulatory Visit: Payer: Self-pay

## 2021-09-26 ENCOUNTER — Inpatient Hospital Stay (HOSPITAL_BASED_OUTPATIENT_CLINIC_OR_DEPARTMENT_OTHER): Payer: Medicare Other | Admitting: Hematology

## 2021-09-26 ENCOUNTER — Inpatient Hospital Stay: Payer: Medicare Other | Attending: Hematology

## 2021-09-26 VITALS — BP 160/85 | HR 100 | Temp 97.3°F | Resp 20 | Wt 192.7 lb

## 2021-09-26 DIAGNOSIS — C8248 Follicular lymphoma grade IIIb, lymph nodes of multiple sites: Secondary | ICD-10-CM

## 2021-09-26 DIAGNOSIS — M25511 Pain in right shoulder: Secondary | ICD-10-CM | POA: Insufficient documentation

## 2021-09-26 DIAGNOSIS — R634 Abnormal weight loss: Secondary | ICD-10-CM | POA: Diagnosis not present

## 2021-09-26 DIAGNOSIS — K7581 Nonalcoholic steatohepatitis (NASH): Secondary | ICD-10-CM | POA: Diagnosis not present

## 2021-09-26 DIAGNOSIS — Z8041 Family history of malignant neoplasm of ovary: Secondary | ICD-10-CM | POA: Insufficient documentation

## 2021-09-26 DIAGNOSIS — R61 Generalized hyperhidrosis: Secondary | ICD-10-CM | POA: Insufficient documentation

## 2021-09-26 DIAGNOSIS — Z9221 Personal history of antineoplastic chemotherapy: Secondary | ICD-10-CM | POA: Insufficient documentation

## 2021-09-26 DIAGNOSIS — Z806 Family history of leukemia: Secondary | ICD-10-CM | POA: Diagnosis not present

## 2021-09-26 DIAGNOSIS — Z800 Family history of malignant neoplasm of digestive organs: Secondary | ICD-10-CM | POA: Insufficient documentation

## 2021-09-26 LAB — CMP (CANCER CENTER ONLY)
ALT: 21 U/L (ref 0–44)
AST: 22 U/L (ref 15–41)
Albumin: 4.5 g/dL (ref 3.5–5.0)
Alkaline Phosphatase: 69 U/L (ref 38–126)
Anion gap: 10 (ref 5–15)
BUN: 16 mg/dL (ref 8–23)
CO2: 24 mmol/L (ref 22–32)
Calcium: 9.4 mg/dL (ref 8.9–10.3)
Chloride: 105 mmol/L (ref 98–111)
Creatinine: 0.81 mg/dL (ref 0.44–1.00)
GFR, Estimated: >60 mL/min (ref >60)
Glucose, Bld: 167 mg/dL — ABNORMAL HIGH (ref 70–99)
Potassium: 4 mmol/L (ref 3.5–5.1)
Sodium: 139 mmol/L (ref 135–145)
Total Bilirubin: 0.4 mg/dL (ref 0.3–1.2)
Total Protein: 7.6 g/dL (ref 6.5–8.1)

## 2021-09-26 LAB — LACTATE DEHYDROGENASE: LDH: 136 U/L (ref 98–192)

## 2021-09-26 LAB — CBC WITH DIFFERENTIAL (CANCER CENTER ONLY)
Abs Immature Granulocytes: 0.02 10*3/uL (ref 0.00–0.07)
Basophils Absolute: 0 10*3/uL (ref 0.0–0.1)
Basophils Relative: 1 %
Eosinophils Absolute: 0.2 10*3/uL (ref 0.0–0.5)
Eosinophils Relative: 3 %
HCT: 37.2 % (ref 36.0–46.0)
Hemoglobin: 12.9 g/dL (ref 12.0–15.0)
Immature Granulocytes: 0 %
Lymphocytes Relative: 29 %
Lymphs Abs: 1.8 10*3/uL (ref 0.7–4.0)
MCH: 30.9 pg (ref 26.0–34.0)
MCHC: 34.7 g/dL (ref 30.0–36.0)
MCV: 89 fL (ref 80.0–100.0)
Monocytes Absolute: 0.3 10*3/uL (ref 0.1–1.0)
Monocytes Relative: 5 %
Neutro Abs: 3.9 10*3/uL (ref 1.7–7.7)
Neutrophils Relative %: 62 %
Platelet Count: 234 10*3/uL (ref 150–400)
RBC: 4.18 MIL/uL (ref 3.87–5.11)
RDW: 12.2 % (ref 11.5–15.5)
WBC Count: 6.3 10*3/uL (ref 4.0–10.5)
nRBC: 0 % (ref 0.0–0.2)

## 2021-10-01 ENCOUNTER — Telehealth: Payer: Self-pay | Admitting: Hematology

## 2021-10-01 NOTE — Telephone Encounter (Signed)
Scheduled follow-up appointment per 1/4 los. Patient is aware.

## 2021-10-02 ENCOUNTER — Encounter: Payer: Self-pay | Admitting: Hematology

## 2021-10-02 NOTE — Progress Notes (Addendum)
HEMATOLOGY/ONCOLOGY CLINIC NOTE  Date of Service:.09/26/2021    Patient Care Team: Asencion Noble, MD as PCP - General (Internal Medicine)   CHIEF COMPLAINTS/PURPOSE OF CONSULTATION:  Follow-up for non-Hodgkin's lymphoma   HISTORY OF PRESENTING ILLNESS:  Katelyn Reeves is a wonderful 71 y.o. female who has been referred to [place] by Dr .Asencion Noble, MD for evaluation and management of suspected non-Hodgkin's lymphoma.  Patient notes that she presented with back pain for 4-6 weeks and eventually had pain radiating to her right inguinal area. She notes that she has had interstitial cystitis in the past. She was thought to have a urinary tract infection and was treated with Macrodantin. Says the pain got worse and started radiating into the right groin she had a CT of the abdomen renal stone protocol on 10/25/2016 which showed extensive retroperitoneal lymphadenopathy concerning for lymphoproliferative disorder. She was incidentally noted to have a nodular border of the liver with possible liver cirrhosis. No urinary stones noted.  She subsequently had a PET CT scan ordered by her primary care physician which was done on 11/07/2016. This showed diffuse hypermetabolic spleen with mild splenomegaly. Hypermetabolic adenopathy in the left supraclavicular, bilateral axillary, peripancreatic, gastrohepatic ligament, celiac, porta hepatis, periaortic, and left external iliac chains. Hypermetabolic mesenteric lymph nodes. Appearance favors lymphoma.  Patient notes some night sweats for 2 weeks. Has lost about 10 pounds in the last month. No acute fevers or chills. Notes the back pain is controlled with when necessary Vicodin.  INTERVAL HISTORY:  Ms Schriner is here for follow-up of her non-Hodgkin's lymphoma currently under active surveillance.  She notes no acute new symptoms since her last clinic visit 6 months ago. No fevers, no chills, no night sweats, no unexpected weight loss. She has noted no new  lumps or bumps. No other acute new focal symptoms. 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 Labs done today were reviewed in detail with her and are stable.  MEDICAL HISTORY:  #1 fibrocystic disease of the breast #2 hypertension #3 constipation #4 duplicated right sided ureters #5 imaging concern for possible liver cirrhosis   SURGICAL HISTORY: #1 total abdominal hysterectomy with bilateral salpingo-oophorectomy for ovarian cyst. #2 Lipoma removal left upper back #3 hemorrhoidectomy  SOCIAL HISTORY: Social History   Socioeconomic History   Marital status: Divorced    Spouse name: Not on file   Number of children: Not on file   Years of education: Not on file   Highest education level: Not on file  Occupational History   Not on file  Tobacco Use   Smoking status: Never   Smokeless tobacco: Never  Vaping Use   Vaping Use: Never used  Substance and Sexual Activity   Alcohol use: No   Drug use: No   Sexual activity: Not on file  Other Topics Concern   Not on file  Social History Narrative   Not on file   Social Determinants of Health   Financial Resource Strain: Not on file  Food Insecurity: Not on file  Transportation Needs: Not on file  Physical Activity: Not on file  Stress: Not on file  Social Connections: Not on file  Intimate Partner Violence: Not on file  Nonsmoker no issues with alcohol use or drug use. 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   FAMILY HISTORY: Notes her mother had ovarian cancer  followed by leukemia and died at age 55 years Maternal aunt gastric cancer   ALLERGIES:  is allergic to sulfa antibiotics.  MEDICATIONS:  No current outpatient medications on file.   No current facility-administered medications for this visit.    REVIEW OF SYSTEMS:   .10 Point review of  Systems was done is negative except as noted above.  PHYSICAL EXAMINATION: ECOG PERFORMANCE STATUS: 1 - Symptomatic but completely ambulatory  Vitals:   09/26/21 1216  BP: (!) 160/85  Pulse: 100  Resp: 20  Temp: (!) 97.3 F (36.3 C)  SpO2: 97%    Filed Weights   09/26/21 1216  Weight: 192 lb 11.2 oz (87.4 kg)    .Body mass index is 34.14 kg/m.  Marland Kitchen GENERAL:alert, in no acute distress and comfortable SKIN: no acute rashes, no significant lesions EYES: conjunctiva are pink and non-injected, sclera anicteric OROPHARYNX: MMM, no exudates, no oropharyngeal erythema or ulceration NECK: supple, no JVD LYMPH:  no palpable lymphadenopathy in the cervical, axillary or inguinal regions LUNGS: clear to auscultation b/l with normal respiratory effort HEART: regular rate & rhythm ABDOMEN:  normoactive bowel sounds , non tender, not distended. Extremity: no pedal edema PSYCH: alert & oriented x 3 with fluent speech NEURO: no focal motor/sensory deficits   LABORATORY DATA:  I have reviewed the data as listed  . CBC Latest Ref Rng & Units 09/26/2021 03/29/2021 09/04/2020  WBC 4.0 - 10.5 K/uL 6.3 5.6 5.6  Hemoglobin 12.0 - 15.0 g/dL 12.9 12.4 12.0  Hematocrit 36.0 - 46.0 % 37.2 36.2 35.2(L)  Platelets 150 - 400 K/uL 234 230 212   CBC    Component Value Date/Time   WBC 6.3 09/26/2021 1137   WBC 5.6 03/29/2021 1117   RBC 4.18 09/26/2021 1137   HGB 12.9 09/26/2021 1137   HGB 12.6 09/04/2017 1006   HCT 37.2 09/26/2021 1137   HCT 36.6 09/04/2017 1006   PLT 234 09/26/2021 1137   PLT 222 09/04/2017 1006   MCV 89.0 09/26/2021 1137   MCV 88.4 09/04/2017 1006   MCH 30.9 09/26/2021 1137   MCHC 34.7 09/26/2021 1137   RDW 12.2 09/26/2021 1137   RDW 13.1 09/04/2017 1006   LYMPHSABS 1.8 09/26/2021 1137   LYMPHSABS 1.1 09/04/2017 1006   MONOABS 0.3 09/26/2021 1137   MONOABS 0.3 09/04/2017 1006   EOSABS 0.2 09/26/2021 1137   EOSABS 0.2 09/04/2017 1006   BASOSABS 0.0 09/26/2021 1137    BASOSABS 0.0 09/04/2017 1006   . CMP Latest Ref Rng & Units 09/26/2021 03/29/2021 09/04/2020  Glucose 70 - 99 mg/dL 167(H) 114(H) 183(H)  BUN 8 - 23 mg/dL 16 14 12   Creatinine 0.44 - 1.00 mg/dL 0.81 0.84 0.92  Sodium 135 - 145 mmol/L 139 141 140  Potassium 3.5 - 5.1 mmol/L 4.0 4.3 4.0  Chloride 98 - 111 mmol/L 105 107 105  CO2 22 - 32 mmol/L 24 24 25   Calcium 8.9 - 10.3 mg/dL 9.4 9.4 9.5  Total Protein 6.5 - 8.1 g/dL 7.6 7.5 7.0  Total Bilirubin 0.3 - 1.2 mg/dL 0.4 0.5 0.5  Alkaline Phos 38 - 126 U/L 69 74 62  AST 15 - 41 U/L 22 26 21   ALT 0 - 44 U/L 21 23 17    . Lab Results  Component Value Date   LDH 136 09/26/2021   Component     Latest Ref Rng & Units 11/12/2016  Hep C Virus Ab     0.0 - 0.9 s/co ratio <0.1  Hepatitis  B Surface Ag     Negative Negative  Hep B Core Ab, Tot     Negative Negative       RADIOGRAPHIC STUDIES: I have personally reviewed the radiological images as listed and agreed with the findings in the report. No results found.   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  Presented with Generalized FDG avid lymphadenopathy - predominantly in the abdomen/Retroperitoneum. Also noted to have left supraclavicular and bilateral axillary left more than right FDG avid lymphadenopathy. Based on imaging this would represent at least Stage III disease if this were a lymphoma. LDH level is not significantly elevated. Blood counts are stable. PET/CT scan does show fairly active disease which is FDG avid LNadenopathy and FDG avid splenomegaly. Patient has some constitutional symptoms with about a 10 pound weight loss and some night sweats. Hepatitis profile negative. No other obvious new focal symptoms.  Patient has had an ECHO which shows nl EF  Patient is status post 6 cycles of R CHOP with no  prohibitive toxicities.  PET/CT on 8/72018 - showed complete metabolic response with no metabolically active disease. A few borderline lymph nodes in the retroperitoneum.  CT chest/abd/pelvis 11/04/2017:  No new or progressive findings to suggest recurrent disease. The small abdominal retroperitoneal lymph nodes are unchanged when comparing to PET-CT of 04/29/2017.   Patient completed 2 years of maintenance Rituxan in September 2020. CT chest abdomen pelvis December 2020 no evidence of progressive lymphoma.  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 Improved with massage therapy  #4 h/o mild shingles  #5 Fatty liver/NASH ? cirrhosis  PLAN:  -Patient's labs done today 09/26/2021 CBC within normal limits, CMP stable, LDH within normal limits. -Patient has no clinical or lab evidence of follicular lymphoma progression at this time. -No residual toxicities from previous chemotherapy or maintenance rituximab. -Is following up with primary care physician for age-appropriate cancer screening. -No new infection issues. -No indication for additional treatment of her lymphoma at this time. -Per patient's preference she will follow-up with her primary care physician in 6 months and return to clinic with [place] in 12 months with repeat labs for continued lymphoma surveillance. -She is a highly educated patient and is aware that she should call us if any other new symptoms or related concerns arise.  FOLLOW UP: Follow-up with primary care physician in 6 months Return to clinic with Dr. Irene Limbo with labs in 12 months  All of the patient's questions were answered with apparent satisfaction. The patient knows to call the clinic with any problems, questions or concerns.   Sullivan Lone MD MS AAHIVMS [facility] Hematology/Oncology Physician [facility]    .Marland Kitchen

## 2022-01-10 ENCOUNTER — Ambulatory Visit: Payer: Medicare Other

## 2022-01-10 ENCOUNTER — Encounter: Payer: Self-pay | Admitting: Orthopaedic Surgery

## 2022-01-10 ENCOUNTER — Ambulatory Visit (INDEPENDENT_AMBULATORY_CARE_PROVIDER_SITE_OTHER): Payer: Medicare Other | Admitting: Orthopaedic Surgery

## 2022-01-10 VITALS — BP 170/99 | HR 98 | Ht 63.0 in | Wt 184.0 lb

## 2022-01-10 DIAGNOSIS — R5381 Other malaise: Secondary | ICD-10-CM | POA: Diagnosis not present

## 2022-01-10 DIAGNOSIS — G8929 Other chronic pain: Secondary | ICD-10-CM

## 2022-01-10 DIAGNOSIS — M25561 Pain in right knee: Secondary | ICD-10-CM | POA: Diagnosis not present

## 2022-01-10 NOTE — Progress Notes (Signed)
? ?Subjective:  ? ? Patient ID: Katelyn Reeves, female    DOB: Jan 04, 1951, 71 y.o.   MRN: [id number] ? ?HPI ?She has had right knee pain on and off for the last four years.  She has developed a small lipoma over the medial anterior knee.  She has some swelling at times, no giving way, no trauma, no redness of the knee.  She has stopped doing much walking and is deconditioned. ? ?She is concerned if she starts walking again will the knee be hurting.  She wants to begin exercises. ? ?She is not taking any medicine for the knee as it does not hurt now. ? ? ?Review of Systems  ?Constitutional:  Positive for activity change.  ?Musculoskeletal:  Positive for arthralgias, back pain and myalgias.  ?All other systems reviewed and are negative. ?For Review of Systems, all other systems reviewed and are negative. ? ?The following is a summary of the past history medically, past history surgically, known current medicines, social history and family history.  This information is gathered electronically by the computer from prior information and documentation.  I review this each visit and have found including this information at this point in the chart is beneficial and informative.  ? ?Past Medical History:  ?Diagnosis Date  ? Back pain   ? Headache   ? migraines  ? History of chemotherapy   ? Shingles   ? Swollen lymph nodes   ? left axilla, rectoperitonal   ? ? ?Past Surgical History:  ?Procedure Laterality Date  ? ABDOMINAL HYSTERECTOMY  1980  ? total abdominal hysterectomy with tubes and ovaries  ? BREAST SURGERY Left 1980's  ? breast biopsy  ? CYST EXCISION Left 2017  ? left scapula  ? HEMORRHOID SURGERY  1970's  ? IR CV LINE INJECTION  09/14/2018  ? IR GENERIC HISTORICAL  12/20/2016  ? IR FLUORO GUIDE PORT INSERTION RIGHT 12/20/2016 Markus Daft, MD [facility]INTER[facility]  ? IR GENERIC HISTORICAL  12/20/2016  ? IR US GUIDE VASC ACCESS RIGHT 12/20/2016 Markus Daft, MD [facility]  ? IR REMOVAL TUN ACCESS W/ PORT W/O FL MOD SED  10/07/2018  ?  TUBAL LIGATION  1980's  ? before hysterectomy  ? WRIST GANGLION EXCISION Left 1970's  ? ? ?No current outpatient medications on file prior to visit.  ? ?No current facility-administered medications on file prior to visit.  ? ? ?Social History  ? ?Socioeconomic History  ? Marital status: Divorced  ?  Spouse name: Not on file  ? Number of children: Not on file  ? Years of education: Not on file  ? Highest education level: Not on file  ?Occupational History  ? Not on file  ?Tobacco Use  ? Smoking status: Never  ? Smokeless tobacco: Never  ?Vaping Use  ? Vaping Use: Never used  ?Substance and Sexual Activity  ? Alcohol use: No  ? Drug use: No  ? Sexual activity: Not on file  ?Other Topics Concern  ? Not on file  ?Social History Narrative  ? Not on file  ? ?Social Determinants of Health  ? ?Financial Resource Strain: Not on file  ?Food Insecurity: Not on file  ?Transportation Needs: Not on file  ?Physical Activity: Not on file  ?Stress: Not on file  ?Social Connections: Not on file  ?Intimate Partner Violence: Not on file  ? ? ?History reviewed. No pertinent family history. ? ?BP (!) 170/99   Pulse 98   Ht 5' 3"  (1.6 m)  Wt 184 lb (83.5 kg)   BMI 32.59 kg/m?  ? ?Body mass index is 32.59 kg/m?. ? ?   ?Objective:  ? Physical Exam ?Vitals and nursing note reviewed. Exam conducted with a chaperone present.  ?Constitutional:   ?   Appearance: She is well-developed.  ?HENT:  ?   Head: Normocephalic and atraumatic.  ?Eyes:  ?   Conjunctiva/sclera: Conjunctivae normal.  ?   Pupils: Pupils are equal, round, and reactive to light.  ?Cardiovascular:  ?   Rate and Rhythm: Normal rate and regular rhythm.  ?Pulmonary:  ?   Effort: Pulmonary effort is normal.  ?Abdominal:  ?   Palpations: Abdomen is soft.  ?Musculoskeletal:  ?   Cervical back: Normal range of motion and neck supple.  ?     Legs: ? ?Skin: ?   General: Skin is warm and dry.  ?Neurological:  ?   Mental Status: She is alert and oriented to person, place, and time.  ?    Cranial Nerves: No cranial nerve deficit.  ?   Motor: No abnormal muscle tone.  ?   Coordination: Coordination normal.  ?   Deep Tendon Reflexes: Reflexes are normal and symmetric. Reflexes normal.  ?Psychiatric:     ?   Behavior: Behavior normal.     ?   Thought Content: Thought content normal.     ?   Judgment: Judgment normal.  ?X-rays were done of the right knee, reported separately. ? ? ? ? ?   ?Assessment & Plan:  ? ?Encounter Diagnoses  ?Name Primary?  ? Chronic pain of right knee Yes  ? Physical deconditioning   ? ?She has DJD of the knee. ? ?I have gone over recommendations for walking and getting "back in shape".  She is to start slow, go by time not distance. ? ?She should slowly improve in her endurance. ? ?I will see her back as needed. ? ?Call if any problem. ? ?Precautions discussed. ? ?Electronically Signed ?Sanjuana Kava, MD ?4/20/202310:05 AM ? ?

## 2022-02-27 ENCOUNTER — Other Ambulatory Visit: Payer: Self-pay | Admitting: Oncology

## 2022-04-22 ENCOUNTER — Other Ambulatory Visit (HOSPITAL_COMMUNITY): Payer: Self-pay | Admitting: Internal Medicine

## 2022-04-22 DIAGNOSIS — Z1231 Encounter for screening mammogram for malignant neoplasm of breast: Secondary | ICD-10-CM

## 2022-04-29 ENCOUNTER — Ambulatory Visit (HOSPITAL_COMMUNITY)
Admission: RE | Admit: 2022-04-29 | Discharge: 2022-04-29 | Disposition: A | Discharge status: Home / Self Care | Payer: Medicare Other | Source: Ambulatory Visit | Attending: Internal Medicine | Admitting: Internal Medicine

## 2022-04-29 DIAGNOSIS — Z1231 Encounter for screening mammogram for malignant neoplasm of breast: Secondary | ICD-10-CM | POA: Insufficient documentation

## 2022-05-06 ENCOUNTER — Telehealth: Payer: Self-pay | Admitting: Hematology

## 2022-05-06 NOTE — Telephone Encounter (Signed)
Rescheduled upcoming appointment due to provider's template change. Patient is aware of changes.

## 2022-09-30 ENCOUNTER — Ambulatory Visit: Payer: PRIVATE HEALTH INSURANCE | Admitting: Hematology

## 2022-09-30 ENCOUNTER — Other Ambulatory Visit: Payer: PRIVATE HEALTH INSURANCE

## 2022-09-30 ENCOUNTER — Inpatient Hospital Stay: Payer: PRIVATE HEALTH INSURANCE

## 2022-10-15 ENCOUNTER — Other Ambulatory Visit: Payer: PRIVATE HEALTH INSURANCE

## 2022-10-15 ENCOUNTER — Ambulatory Visit: Payer: PRIVATE HEALTH INSURANCE | Admitting: Hematology

## 2022-10-31 ENCOUNTER — Other Ambulatory Visit: Payer: Self-pay

## 2022-10-31 DIAGNOSIS — C8248 Follicular lymphoma grade IIIb, lymph nodes of multiple sites: Secondary | ICD-10-CM

## 2022-11-01 ENCOUNTER — Inpatient Hospital Stay (HOSPITAL_BASED_OUTPATIENT_CLINIC_OR_DEPARTMENT_OTHER): Payer: Medicare Other | Admitting: Hematology

## 2022-11-01 ENCOUNTER — Inpatient Hospital Stay: Payer: Medicare Other | Attending: Hematology

## 2022-11-01 VITALS — BP 165/95 | HR 84 | Temp 97.9°F | Resp 20 | Wt 197.3 lb

## 2022-11-01 DIAGNOSIS — Z806 Family history of leukemia: Secondary | ICD-10-CM | POA: Diagnosis not present

## 2022-11-01 DIAGNOSIS — Z800 Family history of malignant neoplasm of digestive organs: Secondary | ICD-10-CM | POA: Insufficient documentation

## 2022-11-01 DIAGNOSIS — Z8572 Personal history of non-Hodgkin lymphomas: Secondary | ICD-10-CM | POA: Diagnosis not present

## 2022-11-01 DIAGNOSIS — M25511 Pain in right shoulder: Secondary | ICD-10-CM | POA: Insufficient documentation

## 2022-11-01 DIAGNOSIS — M25552 Pain in left hip: Secondary | ICD-10-CM | POA: Insufficient documentation

## 2022-11-01 DIAGNOSIS — C8248 Follicular lymphoma grade IIIb, lymph nodes of multiple sites: Secondary | ICD-10-CM | POA: Diagnosis not present

## 2022-11-01 DIAGNOSIS — Z8041 Family history of malignant neoplasm of ovary: Secondary | ICD-10-CM | POA: Insufficient documentation

## 2022-11-01 LAB — CBC WITH DIFFERENTIAL (CANCER CENTER ONLY)
Abs Immature Granulocytes: 0.01 10*3/uL (ref 0.00–0.07)
Basophils Absolute: 0.1 10*3/uL (ref 0.0–0.1)
Basophils Relative: 1 %
Eosinophils Absolute: 0.2 10*3/uL (ref 0.0–0.5)
Eosinophils Relative: 4 %
HCT: 36.9 % (ref 36.0–46.0)
Hemoglobin: 12.9 g/dL (ref 12.0–15.0)
Immature Granulocytes: 0 %
Lymphocytes Relative: 32 %
Lymphs Abs: 1.8 10*3/uL (ref 0.7–4.0)
MCH: 31.1 pg (ref 26.0–34.0)
MCHC: 35 g/dL (ref 30.0–36.0)
MCV: 88.9 fL (ref 80.0–100.0)
Monocytes Absolute: 0.4 10*3/uL (ref 0.1–1.0)
Monocytes Relative: 7 %
Neutro Abs: 3.2 10*3/uL (ref 1.7–7.7)
Neutrophils Relative %: 56 %
Platelet Count: 216 10*3/uL (ref 150–400)
RBC: 4.15 MIL/uL (ref 3.87–5.11)
RDW: 12.2 % (ref 11.5–15.5)
WBC Count: 5.6 10*3/uL (ref 4.0–10.5)
nRBC: 0 % (ref 0.0–0.2)

## 2022-11-01 LAB — CMP (CANCER CENTER ONLY)
ALT: 17 U/L (ref 0–44)
AST: 20 U/L (ref 15–41)
Albumin: 4.3 g/dL (ref 3.5–5.0)
Alkaline Phosphatase: 60 U/L (ref 38–126)
Anion gap: 5 (ref 5–15)
BUN: 14 mg/dL (ref 8–23)
CO2: 29 mmol/L (ref 22–32)
Calcium: 9.5 mg/dL (ref 8.9–10.3)
Chloride: 105 mmol/L (ref 98–111)
Creatinine: 0.82 mg/dL (ref 0.44–1.00)
GFR, Estimated: >60 mL/min (ref >60)
Glucose, Bld: 129 mg/dL — ABNORMAL HIGH (ref 70–99)
Potassium: 4 mmol/L (ref 3.5–5.1)
Sodium: 139 mmol/L (ref 135–145)
Total Bilirubin: 0.4 mg/dL (ref 0.3–1.2)
Total Protein: 7.3 g/dL (ref 6.5–8.1)

## 2022-11-01 LAB — LACTATE DEHYDROGENASE: LDH: 141 U/L (ref 98–192)

## 2022-11-01 NOTE — Progress Notes (Signed)
HEMATOLOGY/ONCOLOGY CLINIC NOTE  Date of Service: 11/01/22     Patient Care Team: Asencion Noble, MD as PCP - General (Internal Medicine)   CHIEF COMPLAINTS/PURPOSE OF CONSULTATION:  Follow-up for non-Hodgkin's lymphoma  HISTORY OF PRESENTING ILLNESS:  Katelyn Reeves is a wonderful 72 y.o. female who has been referred to [place] by Dr .Asencion Noble, MD for evaluation and management of suspected non-Hodgkin's lymphoma.  Patient notes that she presented with back pain for 4-6 weeks and eventually had pain radiating to her right inguinal area. She notes that she has had interstitial cystitis in the past. She was thought to have a urinary tract infection and was treated with Macrodantin. Says the pain got worse and started radiating into the right groin she had a CT of the abdomen renal stone protocol on 10/25/2016 which showed extensive retroperitoneal lymphadenopathy concerning for lymphoproliferative disorder. She was incidentally noted to have a nodular border of the liver with possible liver cirrhosis. No urinary stones noted.  She subsequently had a PET CT scan ordered by her primary care physician which was done on 11/07/2016. This showed diffuse hypermetabolic spleen with mild splenomegaly. Hypermetabolic adenopathy in the left supraclavicular, bilateral axillary, peripancreatic, gastrohepatic ligament, celiac, porta hepatis, periaortic, and left external iliac chains. Hypermetabolic mesenteric lymph nodes. Appearance favors lymphoma.  Patient notes some night sweats for 2 weeks. Has lost about 10 pounds in the last month. No acute fevers or chills. Notes the back pain is controlled with when necessary Vicodin.  INTERVAL HISTORY:  [name] is here for follow-up of her non-Hodgkin's lymphoma currently under active surveillance.   Patient was last seen by me on 09/26/21 and was doing well overall with no new medical concerns.   Today, she complains of intermittent left hip pain on her side.  This began 2 months ago ad she uses topical Voltaren. She reports that the pain has not suddenly worsened. She notes that her pain improves with rest and avoiding flights of stairs. She is planning on being evaluated by an orthopedic doctor sometime this year. She denies any recent falls. She notes that she previously had an x-ray performed on her right knee and experiences some stiffness in the area.   She reports that she is not taking any medications on a regular basis. However, she does take occasional migraine medication. No fatigue, fever, chills, night sweats, recent infections, new lumps/bumps, abdominal pain, back pain, or SOB.  She notes that she has recently gained 8 pounds and restarted consuming sweetened carbonated beverages. She has not receive her influenza, RSV, COVID-19 booster, or shingrix vaccinations.   MEDICAL HISTORY:  #1 fibrocystic disease of the breast #2 hypertension #3 constipation #4 duplicated right sided ureters #5 imaging concern for possible liver cirrhosis   SURGICAL HISTORY: #1 total abdominal hysterectomy with bilateral salpingo-oophorectomy for ovarian cyst. #2 Lipoma removal left upper back #3 hemorrhoidectomy  SOCIAL HISTORY: Social History   Socioeconomic History   Marital status: Divorced    Spouse name: Not on file   Number of children: Not on file   Years of education: Not on file   Highest education level: Not on file  Occupational History   Not on file  Tobacco Use   Smoking status: Never   Smokeless tobacco: Never  Vaping Use   Vaping Use: Never used  Substance and Sexual Activity   Alcohol use: No   Drug use: No   Sexual activity: Not on file  Other Topics Concern  Not on file  Social History Narrative   Not on file   Social Determinants of Health   Financial Resource Strain: Not on file  Food Insecurity: Not on file  Transportation Needs: Not on file  Physical Activity: Not on file  Stress: Not on file  Social  Connections: Not on file  Intimate Partner Violence: Not on file  Nonsmoker no issues with alcohol use or drug use. 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   FAMILY HISTORY: Notes her mother had ovarian cancer followed by leukemia and died at age 52 years Maternal aunt gastric cancer   ALLERGIES:  is allergic to sulfa antibiotics.  MEDICATIONS:  No current outpatient medications on file.   No current facility-administered medications for this visit.    REVIEW OF SYSTEMS:    10 Point review of Systems was done is negative except as noted above.   PHYSICAL EXAMINATION: ECOG PERFORMANCE STATUS: 1 - Symptomatic but completely ambulatory  Vitals:   11/01/22 1100  BP: (!) 165/95  Pulse: 84  Resp: 20  Temp: 97.9 F (36.6 C)  SpO2: 99%   Filed Weights   11/01/22 1100  Weight: 197 lb 4.8 oz (89.5 kg)   .Body mass index is 34.95 kg/m.  Marland Kitchen GENERAL:alert, in no acute distress and comfortable SKIN: no acute rashes, no significant lesions EYES: conjunctiva are pink and non-injected, sclera anicteric OROPHARYNX: MMM, no exudates, no oropharyngeal erythema or ulceration NECK: supple, no JVD LYMPH:  no palpable lymphadenopathy in the cervical, axillary or inguinal regions LUNGS: clear to auscultation b/l with normal respiratory effort HEART: regular rate & rhythm ABDOMEN:  normoactive bowel sounds , non tender, not distended. Extremity: no pedal edema PSYCH: alert & oriented x 3 with fluent speech NEURO: no focal motor/sensory deficits   LABORATORY DATA:  I have reviewed the data as listed  .    Latest Ref Rng & Units 11/01/2022   10:34 AM 09/26/2021   11:37 AM 03/29/2021   11:17 AM  CBC  WBC 4.0 - 10.5 K/uL 5.6  6.3  5.6   Hemoglobin 12.0 - 15.0 g/dL 12.9  12.9  12.4   Hematocrit 36.0 - 46.0 % 36.9  37.2  36.2   Platelets 150 - 400 K/uL 216  234  230    CBC    Component Value Date/Time   WBC 5.6 11/01/2022  1034   WBC 5.6 03/29/2021 1117   RBC 4.15 11/01/2022 1034   HGB 12.9 11/01/2022 1034   HGB 12.6 09/04/2017 1006   HCT 36.9 11/01/2022 1034   HCT 36.6 09/04/2017 1006   PLT 216 11/01/2022 1034   PLT 222 09/04/2017 1006   MCV 88.9 11/01/2022 1034   MCV 88.4 09/04/2017 1006   MCH 31.1 11/01/2022 1034   MCHC 35.0 11/01/2022 1034   RDW 12.2 11/01/2022 1034   RDW 13.1 09/04/2017 1006   LYMPHSABS 1.8 11/01/2022 1034   LYMPHSABS 1.1 09/04/2017 1006   MONOABS 0.4 11/01/2022 1034   MONOABS 0.3 09/04/2017 1006   EOSABS 0.2 11/01/2022 1034   EOSABS 0.2 09/04/2017 1006   BASOSABS 0.1 11/01/2022 1034   BASOSABS 0.0 09/04/2017 1006   .    Latest Ref Rng & Units 11/01/2022   10:34 AM 09/26/2021   11:37 AM 03/29/2021   11:17 AM  CMP  Glucose 70 - 99 mg/dL 129  167  114   BUN 8 - 23 mg/dL 14  16  14  Creatinine 0.44 - 1.00 mg/dL 0.82  0.81  0.84   Sodium 135 - 145 mmol/L 139  139  141   Potassium 3.5 - 5.1 mmol/L 4.0  4.0  4.3   Chloride 98 - 111 mmol/L 105  105  107   CO2 22 - 32 mmol/L 29  24  24   $ Calcium 8.9 - 10.3 mg/dL 9.5  9.4  9.4   Total Protein 6.5 - 8.1 g/dL 7.3  7.6  7.5   Total Bilirubin 0.3 - 1.2 mg/dL 0.4  0.4  0.5   Alkaline Phos 38 - 126 U/L 60  69  74   AST 15 - 41 U/L 20  22  26   $ ALT 0 - 44 U/L 17  21  23    $ . Lab Results  Component Value Date   LDH 136 09/26/2021   Component     Latest Ref Rng & Units 11/12/2016  Hep C Virus Ab     0.0 - 0.9 s/co ratio <0.1  Hepatitis B Surface Ag     Negative Negative  Hep B Core Ab, Tot     Negative Negative       RADIOGRAPHIC STUDIES: I have personally reviewed the radiological images as listed and agreed with the findings in the report. No results found.   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  Presented with Generalized FDG avid lymphadenopathy -  predominantly in the abdomen/Retroperitoneum. Also noted to have left supraclavicular and bilateral axillary left more than right FDG avid lymphadenopathy. Based on imaging this would represent at least Stage III disease if this were a lymphoma. LDH level is not significantly elevated. Blood counts are stable. PET/CT scan does show fairly active disease which is FDG avid LNadenopathy and FDG avid splenomegaly. Patient has some constitutional symptoms with about a 10 pound weight loss and some night sweats. Hepatitis profile negative. No other obvious new focal symptoms.  Patient has had an ECHO which shows nl EF  Patient is status post 6 cycles of R CHOP with no prohibitive toxicities.  PET/CT on 8/72018 - showed complete metabolic response with no metabolically active disease. A few borderline lymph nodes in the retroperitoneum.  CT chest/abd/pelvis 11/04/2017:  No new or progressive findings to suggest recurrent disease. The small abdominal retroperitoneal lymph nodes are unchanged when comparing to PET-CT of 04/29/2017.   Patient completed 2 years of maintenance Rituxan in September 2020. CT chest abdomen pelvis December 2020 no evidence of progressive lymphoma.  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 Improved with massage therapy  #4 h/o mild shingles  #5 Fatty liver/NASH ? cirrhosis  PLAN:   -Discussed lab results on 11/01/22 with patient. CBC normal, showed WBC of 5.6 K, hemoglobin of 12.9, and platelets of 216 K. CMP normal. LDH . Lab Results  Component Value Date   LDH 141 11/01/2022   -Recommended patient to receive her vaccinations, including influenza, shringrix, RSV, COVID-19 booster, and Pneumococcal vaccine- Prevnar 20 -Patient has no clinical or lab evidence of follicular lymphoma progression at this time. -return to clinic with [place] in 12 months with repeat labs for continued lymphoma  surveillance.  FOLLOW UP: RTC with Dr Irene Limbo with labs in 12 months   .I have reviewed the  above documentation for accuracy and completeness, and I agree with the above.  The total time spent in the appointment was 20 minutes* .  All of the patient's questions were answered with apparent satisfaction. The patient knows to call the clinic with any problems, questions or concerns.   Sullivan Lone MD MS AAHIVMS [facility] Hematology/Oncology Physician [facility]  .*Total Encounter Time as defined by the Centers for Medicare and Medicaid Services includes, in addition to the face-to-face time of a patient visit (documented in the note above) non-face-to-face time: obtaining and reviewing outside history, ordering and reviewing medications, tests or procedures, care coordination (communications with other health care professionals or caregivers) and documentation in the medical record.   I,Mitra Faeizi,acting as a Education administrator for Sullivan Lone, MD.,have documented all relevant documentation on the behalf of Sullivan Lone, MD,as directed by  Sullivan Lone, MD while in the presence of Sullivan Lone, MD.  .I have reviewed the above documentation for accuracy and completeness, and I agree with the above. Brunetta Genera MD

## 2022-11-07 ENCOUNTER — Encounter: Payer: Self-pay | Admitting: Hematology

## 2022-11-08 ENCOUNTER — Telehealth: Payer: Self-pay | Admitting: Hematology

## 2022-11-08 NOTE — Telephone Encounter (Signed)
Per 2/16 IB reached out to patient to schedule 12 month labs and follow up with [facility], unable to leave message, will call patient back.

## 2022-11-11 ENCOUNTER — Other Ambulatory Visit (HOSPITAL_COMMUNITY): Payer: Self-pay | Admitting: Internal Medicine

## 2022-11-11 DIAGNOSIS — M25552 Pain in left hip: Secondary | ICD-10-CM

## 2022-11-12 ENCOUNTER — Ambulatory Visit (HOSPITAL_COMMUNITY)
Admission: RE | Admit: 2022-11-12 | Discharge: 2022-11-12 | Disposition: A | Discharge status: Home / Self Care | Payer: Medicare Other | Source: Ambulatory Visit | Attending: Internal Medicine | Admitting: Internal Medicine

## 2022-11-12 DIAGNOSIS — M25552 Pain in left hip: Secondary | ICD-10-CM | POA: Insufficient documentation

## 2022-11-21 ENCOUNTER — Encounter: Payer: Self-pay | Admitting: Radiology

## 2023-03-11 DIAGNOSIS — D22112 Melanocytic nevi of right lower eyelid, including canthus: Secondary | ICD-10-CM | POA: Diagnosis not present

## 2023-03-11 DIAGNOSIS — H2513 Age-related nuclear cataract, bilateral: Secondary | ICD-10-CM | POA: Diagnosis not present

## 2023-04-22 ENCOUNTER — Other Ambulatory Visit (HOSPITAL_COMMUNITY): Payer: Self-pay | Admitting: Internal Medicine

## 2023-04-22 DIAGNOSIS — Z1231 Encounter for screening mammogram for malignant neoplasm of breast: Secondary | ICD-10-CM

## 2023-05-01 ENCOUNTER — Ambulatory Visit (HOSPITAL_COMMUNITY): Payer: PRIVATE HEALTH INSURANCE

## 2023-05-08 ENCOUNTER — Ambulatory Visit (HOSPITAL_COMMUNITY)
Admission: RE | Admit: 2023-05-08 | Discharge: 2023-05-08 | Disposition: A | Discharge status: Home / Self Care | Payer: Medicare Other | Source: Ambulatory Visit | Attending: Internal Medicine | Admitting: Internal Medicine

## 2023-05-08 DIAGNOSIS — Z1231 Encounter for screening mammogram for malignant neoplasm of breast: Secondary | ICD-10-CM | POA: Diagnosis not present

## 2023-05-15 DIAGNOSIS — D23112 Other benign neoplasm of skin of right lower eyelid, including canthus: Secondary | ICD-10-CM | POA: Diagnosis not present

## 2023-05-15 DIAGNOSIS — H0279 Other degenerative disorders of eyelid and periocular area: Secondary | ICD-10-CM | POA: Diagnosis not present

## 2023-09-08 DIAGNOSIS — M25552 Pain in left hip: Secondary | ICD-10-CM | POA: Diagnosis not present

## 2023-10-10 DIAGNOSIS — M7062 Trochanteric bursitis, left hip: Secondary | ICD-10-CM | POA: Diagnosis not present

## 2023-10-31 DIAGNOSIS — M25552 Pain in left hip: Secondary | ICD-10-CM | POA: Diagnosis not present

## 2023-10-31 DIAGNOSIS — M7062 Trochanteric bursitis, left hip: Secondary | ICD-10-CM | POA: Diagnosis not present

## 2023-11-05 DIAGNOSIS — M7062 Trochanteric bursitis, left hip: Secondary | ICD-10-CM | POA: Diagnosis not present

## 2023-11-05 DIAGNOSIS — M25552 Pain in left hip: Secondary | ICD-10-CM | POA: Diagnosis not present

## 2023-11-07 ENCOUNTER — Other Ambulatory Visit: Payer: Self-pay

## 2023-11-07 DIAGNOSIS — C8248 Follicular lymphoma grade IIIb, lymph nodes of multiple sites: Secondary | ICD-10-CM

## 2023-11-10 ENCOUNTER — Inpatient Hospital Stay: Payer: Medicare Other | Attending: Hematology

## 2023-11-10 ENCOUNTER — Inpatient Hospital Stay (HOSPITAL_BASED_OUTPATIENT_CLINIC_OR_DEPARTMENT_OTHER): Payer: Medicare Other | Admitting: Hematology

## 2023-11-10 VITALS — BP 173/71 | HR 74 | Temp 98.0°F | Resp 16 | Ht 63.0 in | Wt 189.6 lb

## 2023-11-10 DIAGNOSIS — G629 Polyneuropathy, unspecified: Secondary | ICD-10-CM | POA: Diagnosis not present

## 2023-11-10 DIAGNOSIS — C8248 Follicular lymphoma grade IIIb, lymph nodes of multiple sites: Secondary | ICD-10-CM

## 2023-11-10 DIAGNOSIS — M25511 Pain in right shoulder: Secondary | ICD-10-CM | POA: Diagnosis not present

## 2023-11-10 DIAGNOSIS — K7581 Nonalcoholic steatohepatitis (NASH): Secondary | ICD-10-CM | POA: Diagnosis not present

## 2023-11-10 DIAGNOSIS — C829A Follicular lymphoma, unspecified, in remission: Secondary | ICD-10-CM | POA: Diagnosis not present

## 2023-11-10 LAB — CBC WITH DIFFERENTIAL (CANCER CENTER ONLY)
Abs Immature Granulocytes: 0.01 10*3/uL (ref 0.00–0.07)
Basophils Absolute: 0 10*3/uL (ref 0.0–0.1)
Basophils Relative: 1 %
Eosinophils Absolute: 0.2 10*3/uL (ref 0.0–0.5)
Eosinophils Relative: 3 %
HCT: 36.8 % (ref 36.0–46.0)
Hemoglobin: 12.8 g/dL (ref 12.0–15.0)
Immature Granulocytes: 0 %
Lymphocytes Relative: 33 %
Lymphs Abs: 1.8 10*3/uL (ref 0.7–4.0)
MCH: 30.6 pg (ref 26.0–34.0)
MCHC: 34.8 g/dL (ref 30.0–36.0)
MCV: 88 fL (ref 80.0–100.0)
Monocytes Absolute: 0.4 10*3/uL (ref 0.1–1.0)
Monocytes Relative: 7 %
Neutro Abs: 3.1 10*3/uL (ref 1.7–7.7)
Neutrophils Relative %: 56 %
Platelet Count: 224 10*3/uL (ref 150–400)
RBC: 4.18 MIL/uL (ref 3.87–5.11)
RDW: 12.2 % (ref 11.5–15.5)
WBC Count: 5.5 10*3/uL (ref 4.0–10.5)
nRBC: 0 % (ref 0.0–0.2)

## 2023-11-10 LAB — CMP (CANCER CENTER ONLY)
ALT: 20 U/L (ref 0–44)
AST: 22 U/L (ref 15–41)
Albumin: 4.5 g/dL (ref 3.5–5.0)
Alkaline Phosphatase: 71 U/L (ref 38–126)
Anion gap: 7 (ref 5–15)
BUN: 16 mg/dL (ref 8–23)
CO2: 26 mmol/L (ref 22–32)
Calcium: 9.3 mg/dL (ref 8.9–10.3)
Chloride: 106 mmol/L (ref 98–111)
Creatinine: 0.78 mg/dL (ref 0.44–1.00)
GFR, Estimated: >60 mL/min (ref >60)
Glucose, Bld: 147 mg/dL — ABNORMAL HIGH (ref 70–99)
Potassium: 4.1 mmol/L (ref 3.5–5.1)
Sodium: 139 mmol/L (ref 135–145)
Total Bilirubin: 0.5 mg/dL (ref 0.0–1.2)
Total Protein: 7 g/dL (ref 6.5–8.1)

## 2023-11-10 LAB — LACTATE DEHYDROGENASE: LDH: 144 U/L (ref 98–192)

## 2023-11-10 NOTE — Progress Notes (Signed)
 HEMATOLOGY/ONCOLOGY CLINIC NOTE  Date of Service: 11/10/23     Patient Care Team: Carylon Perches, MD as PCP - General (Internal Medicine)   CHIEF COMPLAINTS/PURPOSE OF CONSULTATION:  Follow-up for non-Hodgkin's lymphoma  HISTORY OF PRESENTING ILLNESS:  Katelyn Reeves is a wonderful 73 y.o. female who has been referred to [place] by Dr .Carylon Perches, MD for evaluation and management of suspected non-Hodgkin's lymphoma.  Patient notes that she presented with back pain for 4-6 weeks and eventually had pain radiating to her right inguinal area. She notes that she has had interstitial cystitis in the past. She was thought to have a urinary tract infection and was treated with Macrodantin. Says the pain got worse and started radiating into the right groin she had a CT of the abdomen renal stone protocol on 10/25/2016 which showed extensive retroperitoneal lymphadenopathy concerning for lymphoproliferative disorder. She was incidentally noted to have a nodular border of the liver with possible liver cirrhosis. No urinary stones noted.  She subsequently had a PET CT scan ordered by her primary care physician which was done on 11/07/2016. This showed diffuse hypermetabolic spleen with mild splenomegaly. Hypermetabolic adenopathy in the left supraclavicular, bilateral axillary, peripancreatic, gastrohepatic ligament, celiac, porta hepatis, periaortic, and left external iliac chains. Hypermetabolic mesenteric lymph nodes. Appearance favors lymphoma.  Patient notes some night sweats for 2 weeks. Has lost about 10 pounds in the last month. No acute fevers or chills. Notes the back pain is controlled with when necessary Vicodin.  INTERVAL HISTORY:  Katelyn Reeves is here for follow-up of her non-Hodgkin's lymphoma currently under active surveillance.   Patient was last seen by me on 11/01/2022 and she complained of intermittent left hip pain and occasional migraines.   Patient notes she has been doing well  overall since our last visit. She has been having persistent left hip pain. She has been to [facility] and has been prescribed Meloxicam 700 mg daily and has started PT.   She denies any new infection issues, fever, chills, night sweats, unexpected weight loss, back pain, chest pain, abdominal pain, or leg swelling.   Patient notes she recently fell in her bathroom around 2-3 weeks ago. She denies any injuries.   MEDICAL HISTORY:  #1 fibrocystic disease of the breast #2 hypertension #3 constipation #4 duplicated right sided ureters #5 imaging concern for possible liver cirrhosis   SURGICAL HISTORY: #1 total abdominal hysterectomy with bilateral salpingo-oophorectomy for ovarian cyst. #2 Lipoma removal left upper back #3 hemorrhoidectomy  SOCIAL HISTORY: Social History   Socioeconomic History   Marital status: Divorced    Spouse name: Not on file   Number of children: Not on file   Years of education: Not on file   Highest education level: Not on file  Occupational History   Not on file  Tobacco Use   Smoking status: Never   Smokeless tobacco: Never  Vaping Use   Vaping status: Never Used  Substance and Sexual Activity   Alcohol use: No   Drug use: No   Sexual activity: Not on file  Other Topics Concern   Not on file  Social History Narrative   Not on file   Social Drivers of Health   Financial Resource Strain: Not on file  Food Insecurity: Not on file  Transportation Needs: Not on file  Physical Activity: Not on file  Stress: Not on file  Social Connections: Not on file  Intimate Partner Violence: Not on file  Nonsmoker no issues  with alcohol use or drug use. 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   FAMILY HISTORY: Notes her mother had ovarian cancer followed by leukemia and died at age 63 years Maternal aunt gastric cancer   ALLERGIES:  is allergic to sulfa antibiotics.  MEDICATIONS:  No  current outpatient medications on file.   No current facility-administered medications for this visit.    REVIEW OF SYSTEMS:    10 Point review of Systems was done is negative except as noted above.   PHYSICAL EXAMINATION: ECOG PERFORMANCE STATUS: 1 - Symptomatic but completely ambulatory  Vitals:   11/10/23 1044  BP: (!) 173/71  Pulse: 74  Resp: 16  Temp: 98 F (36.7 C)  SpO2: 100%    Filed Weights   11/10/23 1044  Weight: 189 lb 9.6 oz (86 kg)    .Body mass index is 33.59 kg/m.  Marland Kitchen GENERAL:alert, in no acute distress and comfortable SKIN: no acute rashes, no significant lesions EYES: conjunctiva are pink and non-injected, sclera anicteric OROPHARYNX: MMM, no exudates, no oropharyngeal erythema or ulceration NECK: supple, no JVD LYMPH:  no palpable lymphadenopathy in the cervical, axillary or inguinal regions LUNGS: clear to auscultation b/l with normal respiratory effort HEART: regular rate & rhythm ABDOMEN:  normoactive bowel sounds , non tender, not distended. Extremity: no pedal edema PSYCH: alert & oriented x 3 with fluent speech NEURO: no focal motor/sensory deficits   LABORATORY DATA:  I have reviewed the data as listed  .    Latest Ref Rng & Units 11/10/2023    9:26 AM 11/01/2022   10:34 AM 09/26/2021   11:37 AM  CBC  WBC 4.0 - 10.5 K/uL 5.5  5.6  6.3   Hemoglobin 12.0 - 15.0 g/dL 40.9  81.1  91.4   Hematocrit 36.0 - 46.0 % 36.8  36.9  37.2   Platelets 150 - 400 K/uL 224  216  234    CBC    Component Value Date/Time   WBC 5.6 11/01/2022 1034   WBC 5.6 03/29/2021 1117   RBC 4.15 11/01/2022 1034   HGB 12.9 11/01/2022 1034   HGB 12.6 09/04/2017 1006   HCT 36.9 11/01/2022 1034   HCT 36.6 09/04/2017 1006   PLT 216 11/01/2022 1034   PLT 222 09/04/2017 1006   MCV 88.9 11/01/2022 1034   MCV 88.4 09/04/2017 1006   MCH 31.1 11/01/2022 1034   MCHC 35.0 11/01/2022 1034   RDW 12.2 11/01/2022 1034   RDW 13.1 09/04/2017 1006   LYMPHSABS 1.8 11/01/2022  1034   LYMPHSABS 1.1 09/04/2017 1006   MONOABS 0.4 11/01/2022 1034   MONOABS 0.3 09/04/2017 1006   EOSABS 0.2 11/01/2022 1034   EOSABS 0.2 09/04/2017 1006   BASOSABS 0.1 11/01/2022 1034   BASOSABS 0.0 09/04/2017 1006   .    Latest Ref Rng & Units 11/10/2023    9:26 AM 11/01/2022   10:34 AM 09/26/2021   11:37 AM  CMP  Glucose 70 - 99 mg/dL 782  956  213   BUN 8 - 23 mg/dL 16  14  16    Creatinine 0.44 - 1.00 mg/dL 0.86  5.78  4.69   Sodium 135 - 145 mmol/L 139  139  139   Potassium 3.5 - 5.1 mmol/L 4.1  4.0  4.0   Chloride 98 - 111 mmol/L 106  105  105   CO2 22 - 32 mmol/L 26  29  24    Calcium 8.9 -  10.3 mg/dL 9.3  9.5  9.4   Total Protein 6.5 - 8.1 g/dL 7.0  7.3  7.6   Total Bilirubin 0.0 - 1.2 mg/dL 0.5  0.4  0.4   Alkaline Phos 38 - 126 U/L 71  60  69   AST 15 - 41 U/L 22  20  22    ALT 0 - 44 U/L 20  17  21     . Lab Results  Component Value Date   LDH 144 11/10/2023   Component     Latest Ref Rng & Units 11/12/2016  Hep C Virus Ab     0.0 - 0.9 s/co ratio <0.1  Hepatitis B Surface Ag     Negative Negative  Hep B Core Ab, Tot     Negative Negative       RADIOGRAPHIC STUDIES: I have personally reviewed the radiological images as listed and agreed with the findings in the report. No results found.   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  Presented with Generalized FDG avid lymphadenopathy - predominantly in the abdomen/Retroperitoneum. Also noted to have left supraclavicular and bilateral axillary left more than right FDG avid lymphadenopathy. Based on imaging this would represent at least Stage III disease if this were a lymphoma. LDH level is not significantly elevated. Blood counts are stable. PET/CT scan does show fairly active disease which is FDG avid LNadenopathy and FDG avid  splenomegaly. Patient has some constitutional symptoms with about a 10 pound weight loss and some night sweats. Hepatitis profile negative. No other obvious new focal symptoms.  Patient has had an ECHO which shows nl EF  Patient is status post 6 cycles of R CHOP with no prohibitive toxicities.  PET/CT on 8/72018 - showed complete metabolic response with no metabolically active disease. A few borderline lymph nodes in the retroperitoneum.  CT chest/abd/pelvis 11/04/2017:  No new or progressive findings to suggest recurrent disease. The small abdominal retroperitoneal lymph nodes are unchanged when comparing to PET-CT of 04/29/2017.   Patient completed 2 years of maintenance Rituxan in September 2020. CT chest abdomen pelvis December 2020 no evidence of progressive lymphoma.  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 Improved with massage therapy  #4 h/o mild shingles  #5 Fatty liver/NASH ? cirrhosis  PLAN:   -Discussed lab results from today, 11/10/2023, in detail with the patient. CBC stable. CMP stable. LDH stable. -Recommended patient to receive her vaccinations, including influenza, shringrix, RSV, COVID-19 booster, and Pneumococcal vaccine- Prevnar 20 -Patient has no clinical or lab evidence of follicular lymphoma progression at this time. -return to clinic with [place] in 12 months with repeat labs for continued lymphoma surveillance.  FOLLOW-UP: RTC with Dr Candise Che with labs in 12 months  The total time spent in the appointment was 20 minutes* .  All of the patient's questions were answered with apparent satisfaction. The patient knows to call the clinic with any problems, questions or concerns.   Wyvonnia Lora MD Katelyn AAHIVMS [facility] Hematology/Oncology Physician [facility]  .*Total Encounter Time as defined by the Centers for Medicare and Medicaid Services includes, in addition to the  face-to-face time of a patient visit (documented in the note above) non-face-to-face time: obtaining and reviewing outside history, ordering and reviewing medications,  tests or procedures, care coordination (communications with other health care professionals or caregivers) and documentation in the medical record.   I,Param Shah,acting as a Neurosurgeon for Wyvonnia Lora, MD.,have documented all relevant documentation on the behalf of Wyvonnia Lora, MD,as directed by  Wyvonnia Lora, MD while in the presence of Wyvonnia Lora, MD.  .I have reviewed the above documentation for accuracy and completeness, and I agree with the above. Johney Maine MD

## 2023-11-11 DIAGNOSIS — M7062 Trochanteric bursitis, left hip: Secondary | ICD-10-CM | POA: Diagnosis not present

## 2023-11-11 DIAGNOSIS — M25552 Pain in left hip: Secondary | ICD-10-CM | POA: Diagnosis not present

## 2023-11-16 ENCOUNTER — Encounter: Payer: Self-pay | Admitting: Hematology

## 2023-11-17 DIAGNOSIS — M25552 Pain in left hip: Secondary | ICD-10-CM | POA: Diagnosis not present

## 2023-11-17 DIAGNOSIS — M7062 Trochanteric bursitis, left hip: Secondary | ICD-10-CM | POA: Diagnosis not present

## 2024-04-21 ENCOUNTER — Other Ambulatory Visit (HOSPITAL_COMMUNITY): Payer: Self-pay | Admitting: Internal Medicine

## 2024-04-21 DIAGNOSIS — Z1231 Encounter for screening mammogram for malignant neoplasm of breast: Secondary | ICD-10-CM

## 2024-05-10 ENCOUNTER — Encounter (HOSPITAL_COMMUNITY): Payer: Self-pay

## 2024-05-10 ENCOUNTER — Ambulatory Visit (HOSPITAL_COMMUNITY)
Admission: RE | Admit: 2024-05-10 | Discharge: 2024-05-10 | Disposition: A | Discharge status: Home / Self Care | Payer: PRIVATE HEALTH INSURANCE | Source: Ambulatory Visit | Attending: Internal Medicine | Admitting: Internal Medicine

## 2024-05-10 DIAGNOSIS — Z1231 Encounter for screening mammogram for malignant neoplasm of breast: Secondary | ICD-10-CM | POA: Diagnosis not present

## 2024-05-14 ENCOUNTER — Encounter: Payer: Self-pay | Admitting: Radiology

## 2024-07-26 ENCOUNTER — Encounter: Payer: Self-pay | Admitting: Radiology

## 2024-11-08 ENCOUNTER — Inpatient Hospital Stay: Payer: PRIVATE HEALTH INSURANCE | Admitting: Hematology

## 2024-11-08 ENCOUNTER — Inpatient Hospital Stay: Payer: PRIVATE HEALTH INSURANCE
